# Patient Record
Sex: Female | Born: 1935 | Race: White | Hispanic: No | Marital: Married | Smoking: Former smoker
Health system: Southern US, Community
[De-identification: ages and names within clinical notes are randomized; demographics above are authoritative.]

## PROBLEM LIST (undated history)

## (undated) DIAGNOSIS — K219 Gastro-esophageal reflux disease without esophagitis: Secondary | ICD-10-CM

## (undated) DIAGNOSIS — I350 Nonrheumatic aortic (valve) stenosis: Secondary | ICD-10-CM

## (undated) DIAGNOSIS — K589 Irritable bowel syndrome without diarrhea: Secondary | ICD-10-CM

## (undated) DIAGNOSIS — F419 Anxiety disorder, unspecified: Secondary | ICD-10-CM

## (undated) DIAGNOSIS — F0390 Unspecified dementia without behavioral disturbance: Secondary | ICD-10-CM

## (undated) DIAGNOSIS — R413 Other amnesia: Secondary | ICD-10-CM

## (undated) DIAGNOSIS — E785 Hyperlipidemia, unspecified: Secondary | ICD-10-CM

## (undated) DIAGNOSIS — E079 Disorder of thyroid, unspecified: Secondary | ICD-10-CM

## (undated) DIAGNOSIS — I10 Essential (primary) hypertension: Secondary | ICD-10-CM

## (undated) DIAGNOSIS — M1990 Unspecified osteoarthritis, unspecified site: Secondary | ICD-10-CM

## (undated) HISTORY — DX: Other amnesia: R41.3

## (undated) HISTORY — PX: ABDOMINAL HYSTERECTOMY: SHX81

## (undated) HISTORY — DX: Unspecified osteoarthritis, unspecified site: M19.90

## (undated) HISTORY — DX: Essential (primary) hypertension: I10

## (undated) HISTORY — DX: Irritable bowel syndrome, unspecified: K58.9

## (undated) HISTORY — DX: Hyperlipidemia, unspecified: E78.5

## (undated) HISTORY — DX: Anxiety disorder, unspecified: F41.9

## (undated) HISTORY — DX: Nonrheumatic aortic (valve) stenosis: I35.0

## (undated) HISTORY — DX: Disorder of thyroid, unspecified: E07.9

## (undated) HISTORY — DX: Unspecified dementia, unspecified severity, without behavioral disturbance, psychotic disturbance, mood disturbance, and anxiety: F03.90

## (undated) HISTORY — DX: Gastro-esophageal reflux disease without esophagitis: K21.9

---

## 1991-07-18 HISTORY — PX: ESOPHAGOGASTRODUODENOSCOPY: SHX1529

## 1993-01-30 HISTORY — PX: ESOPHAGOGASTRODUODENOSCOPY: SHX1529

## 1998-09-17 ENCOUNTER — Encounter: Admission: RE | Admit: 1998-09-17 | Discharge: 1998-12-16 | Payer: Self-pay | Admitting: Orthopaedic Surgery

## 1999-03-17 ENCOUNTER — Emergency Department (HOSPITAL_COMMUNITY): Admission: EM | Admit: 1999-03-17 | Discharge: 1999-03-18 | Payer: Self-pay | Admitting: Emergency Medicine

## 1999-06-14 ENCOUNTER — Emergency Department (HOSPITAL_COMMUNITY): Admission: EM | Admit: 1999-06-14 | Discharge: 1999-06-14 | Payer: Self-pay | Admitting: Emergency Medicine

## 2004-02-05 ENCOUNTER — Encounter: Admission: RE | Admit: 2004-02-05 | Discharge: 2004-02-05 | Payer: Self-pay | Admitting: Orthopedic Surgery

## 2004-02-08 ENCOUNTER — Ambulatory Visit (HOSPITAL_COMMUNITY): Admission: RE | Admit: 2004-02-08 | Discharge: 2004-02-08 | Payer: Self-pay | Admitting: Orthopedic Surgery

## 2004-02-08 ENCOUNTER — Ambulatory Visit (HOSPITAL_BASED_OUTPATIENT_CLINIC_OR_DEPARTMENT_OTHER): Admission: RE | Admit: 2004-02-08 | Discharge: 2004-02-08 | Payer: Self-pay | Admitting: Orthopedic Surgery

## 2010-07-22 ENCOUNTER — Ambulatory Visit: Payer: Self-pay | Admitting: Cardiology

## 2011-02-05 ENCOUNTER — Ambulatory Visit: Payer: Self-pay | Admitting: Cardiology

## 2011-02-06 ENCOUNTER — Ambulatory Visit (INDEPENDENT_AMBULATORY_CARE_PROVIDER_SITE_OTHER): Payer: Medicare Other | Admitting: *Deleted

## 2011-02-06 DIAGNOSIS — I10 Essential (primary) hypertension: Secondary | ICD-10-CM

## 2011-02-06 DIAGNOSIS — E039 Hypothyroidism, unspecified: Secondary | ICD-10-CM

## 2011-02-06 DIAGNOSIS — I359 Nonrheumatic aortic valve disorder, unspecified: Secondary | ICD-10-CM

## 2011-04-23 ENCOUNTER — Other Ambulatory Visit: Payer: Self-pay | Admitting: *Deleted

## 2011-04-23 DIAGNOSIS — F419 Anxiety disorder, unspecified: Secondary | ICD-10-CM

## 2011-04-23 MED ORDER — ALPRAZOLAM 0.25 MG PO TABS: ORAL_TABLET | ORAL | Status: DC

## 2011-04-23 NOTE — Telephone Encounter (Signed)
Received fax from [facility] requesting rx for xanax.  Faxed to facility

## 2011-05-08 NOTE — Op Note (Signed)
NAME:  Savannah Todd, Savannah Todd NO.:  [id number]   MEDICAL RECORD NO.:  [id number]                   PATIENT TYPE:  AMB   LOCATION:  DSC                                  FACILITY:  [facility]   PHYSICIAN:  Thera Flake., M.D.             DATE OF BIRTH:  [birth year]   DATE OF PROCEDURE:  02/08/2004  DATE OF DISCHARGE:                                 OPERATIVE REPORT   INDICATIONS FOR PROCEDURE:  This is an 77 year old who fractured her  clavicle with possible rotator cuff tear thought to be amenable to  outpatient surgery.   PREOPERATIVE DIAGNOSIS:  Rotator cuff tear.   POSTOPERATIVE DIAGNOSIS:  1. Partial rotator cuff tear.  2. Degenerative tear of anterior superior labrum.  3. Grade 3 glenohumeral arthritis.  4. Acromioclavicular arthritis.   OPERATION:  1. Arthroscopic acromioplasty.  2. Arthroscopic debridement of torn labrum and glenohumeral joint.  3. Arthroscopic distal clavicle excision.   SURGEON:  Dyke Brackett, M.D.   ASSISTANT:  Madilyn Fireman, P.A.-C.   ANESTHESIA:  General/block.   DESCRIPTION OF PROCEDURE:  Examination under anesthesia showed no loss of  motion, no instability.  She was arthroscoped through a posterolateral and  anterior portal.  The glenohumeral joint showed degenerative change, I would  estimate as early grade 3 changes, involving approximately 50% of the  surface area of the humeral head with probably 75% involvement of the  glenoid.  There were not severe glenohumeral joint changes but I would  describe them as moderate, though she did have degenerative tear of the  anterior superior labrum.  The cuff did not show a complete tear.  There was  basically almost a meniscus of tissue where there was more healthy tissue  followed by a short interval of about a cm of more fibrous type cuff, but  again, there was no complete cuff tear.  It is my feeling based on the lack  of the complete tear, it is a degree of glenohumeral  joint arthritis and  arthroscopic debridement would be preferable to opening the shoulder,  completing the tear, and repairing it.  The joint was debrided separate from  the subacromial space which was entered.  There was a significant  impingement lesion on the CA ligament which was removed, AC arthritis that  was resected of about 1 cm of the distal clavicle.  All the impingement was  relieved with bursectomy and acromioplasty carried back from the lateral and  posterior portal.  A  complete bursectomy was carried out.  The superior surface of the cuff was  thoroughly inspected and not seen to be a complete tear.  The shoulder was  drained clear of fluid.  The portals were closed with nylon.  A lightly  compressive sterile dressing was applied.  Thera Flake., M.D.    WDC/MEDQ  D:  02/08/2004  T:  02/09/2004  Job:  [id number]

## 2011-05-19 ENCOUNTER — Encounter: Payer: Self-pay | Admitting: Cardiology

## 2011-06-03 ENCOUNTER — Encounter: Payer: Self-pay | Admitting: Cardiology

## 2011-06-05 ENCOUNTER — Ambulatory Visit (INDEPENDENT_AMBULATORY_CARE_PROVIDER_SITE_OTHER): Payer: Medicare Other | Admitting: Cardiology

## 2011-06-05 ENCOUNTER — Encounter: Payer: Self-pay | Admitting: Cardiology

## 2011-06-05 DIAGNOSIS — I119 Hypertensive heart disease without heart failure: Secondary | ICD-10-CM

## 2011-06-05 DIAGNOSIS — E7800 Pure hypercholesterolemia, unspecified: Secondary | ICD-10-CM

## 2011-06-05 DIAGNOSIS — E039 Hypothyroidism, unspecified: Secondary | ICD-10-CM | POA: Insufficient documentation

## 2011-06-05 DIAGNOSIS — I350 Nonrheumatic aortic (valve) stenosis: Secondary | ICD-10-CM

## 2011-06-05 DIAGNOSIS — K589 Irritable bowel syndrome without diarrhea: Secondary | ICD-10-CM

## 2011-06-05 DIAGNOSIS — F0390 Unspecified dementia without behavioral disturbance: Secondary | ICD-10-CM

## 2011-06-05 DIAGNOSIS — I359 Nonrheumatic aortic valve disorder, unspecified: Secondary | ICD-10-CM

## 2011-06-05 NOTE — Progress Notes (Signed)
Savannah Todd Date of Birth:  1920-07-02 [facility] / [facility] [address]   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  She has a history of essential hypertension and history of known aortic stenosis.  Her last echocardiogram on 04/17/10 showed an aortic valve area of 0.8.  She has not been expressing any exertional chest pain or symptoms of congestive heart failure.  She's not having any exertional dizziness or syncope.  She does have essential hypertension and is on amlodipine and has mild ankle edema related to that.  She has a history of hypothyroidism and is clinically euthyroid.  She has a history of poor balance and has done better with fewer falls and that she uses a walker on a regular basis.  The nursing home staff he is now administering her medication and so she is getting it reliably  Current Outpatient Prescriptions  Medication Sig Dispense Refill  . acetaminophen (TYLENOL) 500 MG tablet Take 500 mg by mouth every 6 (six) hours as needed.        . ALPRAZolam (XANAX) 0.25 MG tablet 1/2 tablet twice daily as needed  30 tablet  5  . amLODipine (NORVASC) 5 MG tablet Take 5 mg by mouth daily.        . balsalazide (COLAZAL) 750 MG capsule Take 2,250 mg by mouth 3 (three) times daily. 3 tablets TID        . BREWERS YEAST PO Take 2 tablets by mouth 3 (three) times daily.        . Calcium Carbonate Antacid (TUMS PO) Take 1 tablet by mouth as needed.        . Calcium-Vitamin D-Vitamin K (VIACTIV PO) Take 1 tablet by mouth 3 (three) times daily.        . Cholecalciferol (VITAMIN D PO) Take by mouth. Taking 400 IU daily       . digoxin (LANOXIN) 0.125 MG tablet Take 125 mcg by mouth daily.        . diphenhydrAMINE (BENADRYL) 25 MG tablet Take 25 mg by mouth every 6 (six) hours as needed.        Marland Kitchen esomeprazole (NEXIUM) 40  MG capsule Take 40 mg by mouth daily before breakfast.        . fexofenadine (ALLEGRA) 60 MG tablet Take 60 mg by mouth daily.        . hydrochlorothiazide 25 MG tablet Take 25 mg by mouth daily. 1/2 tablet prn       . levothyroxine (SYNTHROID, LEVOTHROID) 100 MCG tablet Take 112 mcg by mouth daily.        . meloxicam (MOBIC) 7.5 MG tablet Take 7.5 mg by mouth daily.        . Multiple Vitamin (MULTIVITAMIN PO) Take 1 tablet by mouth daily.        . nitroGLYCERIN (NITROSTAT) 0.4 MG SL tablet Place 0.4 mg under the tongue every 5 (five) minutes as needed.        . Nutritional Supplements (ENSURE PO) Take 1 Can by mouth 2 (two) times daily.        . simvastatin (ZOCOR) 10 MG tablet Take 10 mg by mouth at bedtime.        Marland Kitchen DISCONTD: nitroGLYCERIN (NITRODUR - DOSED IN MG/24 HR) 0.4 mg/hr Place 1  patch onto the skin as needed.          Allergies  Allergen Reactions  . Niaspan (Niacin (Antihyperlipidemic)) Itching  . Bactrim Nausea Only  . Lotensin Cough  . Naprosyn (Naproxen) Itching    Patient Active Problem List  Diagnoses  . Aortic stenosis  . Dementia  . Hypothyroidism  . Irritable bowel syndrome  . Benign hypertensive heart disease without heart failure  . Hypercholesterolemia    History  Smoking status  . Never Smoker   Smokeless tobacco  . Not on file    History  Alcohol Use No    Family History  Problem Relation Age of Onset  . Heart disease      Review of Systems: Constitutional: no fever chills diaphoresis or fatigue or change in weight.  Head and neck: no hearing loss, no epistaxis, no photophobia or visual disturbance. Respiratory: No cough, shortness of breath or wheezing. Cardiovascular: No chest pain peripheral edema, palpitations. Gastrointestinal: No abdominal distention, no abdominal pain, no change in bowel habits hematochezia or melena. Genitourinary: No dysuria, no frequency, no urgency, no nocturia. Musculoskeletal:No arthralgias, no back pain, no  gait disturbance or myalgias. Neurological: No dizziness, no headaches, no numbness, no seizures, no syncope, no weakness, no tremors. Hematologic: No lymphadenopathy, no easy bruising. Psychiatric: No confusion, no hallucinations, no sleep disturbance.    Physical Exam: Filed Vitals:   06/05/11 1118  BP: 144/70  Pulse: 64  The general appearance reveals a well-developed elderly woman in no acute distress.The head and neck exam reveals pupils equal and reactive.  Extraocular movements are full.  There is no scleral icterus.  The mouth and pharynx are normal.  The neck is supple.  The carotids reveal Soft bruits louder on the left than the right.  The jugular venous pressure is normal.  The  thyroid is not enlarged.  There is no lymphadenopathy.  The chest is clear to percussion and auscultation.  There are no rales or rhonchi.  Expansion of the chest is symmetrical.  The precordium is quiet.  The first heart sound is normal.  The second heart sound is physiologically split.  There is a grade 2/6 harsh systolic ejection murmur at the base.  There is no abnormal lift or heave.  The abdomen is soft and nontender.  The bowel sounds are normal.  The liver and spleen are not enlarged.  There are no abdominal masses.  There are no abdominal bruits.  Extremities reveal good pedal pulses.  There is no phlebitis or edema.  There is no cyanosis or clubbing.  Strength is normal and symmetrical in all extremities.  There is no lateralizing weakness.  There are no sensory deficits.  The skin is warm and dry.  There is no rash.   Assessment / Plan: Continue same medication.  Recheck in 4 months and consider getting 2-D echo after next visit to followup on aortic stenosis.

## 2011-06-05 NOTE — Assessment & Plan Note (Signed)
The patient has a history of irritable bowel syndrome.  She is followed for this by Dr. Carman Ching.  She reports that her bowels have done better on her present medical regimen.

## 2011-06-05 NOTE — Assessment & Plan Note (Signed)
The patient is on simvastatin 10 mg daily for her hypercholesterolemia.  Dr. Cleta Alberts is following her cholesterol.  She's not having any side effects from the simvastatin.

## 2011-06-05 NOTE — Assessment & Plan Note (Signed)
The patient has a history of essential hypertension.  She is on amlodipine.  She is having some mild ankle edema probably secondary to amlodipine.  She is not having any orthopnea or paroxysmal nocturnal dyspnea.

## 2011-06-05 NOTE — Assessment & Plan Note (Signed)
The patient has a history of known severe aortic stenosis.  Her last echocardiogram on 04/17/10 showed an aortic valve area of 0.8.  She has not been experiencing any of the cardinal manifestations of severe aortic stenosis.  She denies any chest pain or increased dyspnea.  She has not had exertional syncope or dizziness.

## 2011-06-19 ENCOUNTER — Encounter: Payer: Self-pay | Admitting: Cardiology

## 2011-08-17 ENCOUNTER — Other Ambulatory Visit: Payer: Self-pay | Admitting: Emergency Medicine

## 2011-08-17 ENCOUNTER — Ambulatory Visit
Admission: RE | Admit: 2011-08-17 | Discharge: 2011-08-17 | Disposition: A | Discharge status: Home / Self Care | Payer: No Typology Code available for payment source | Source: Ambulatory Visit | Attending: Emergency Medicine | Admitting: Emergency Medicine

## 2011-08-17 DIAGNOSIS — R410 Disorientation, unspecified: Secondary | ICD-10-CM

## 2011-09-08 ENCOUNTER — Telehealth: Payer: Self-pay | Admitting: Cardiology

## 2011-09-08 NOTE — Telephone Encounter (Signed)
Left message

## 2011-09-08 NOTE — Telephone Encounter (Signed)
Called to schedule his mother's appointment from a recall letter them received and was uncomfortable. Please call back.

## 2011-10-02 NOTE — Telephone Encounter (Signed)
Spoke with son and moved appointment up

## 2011-10-20 ENCOUNTER — Ambulatory Visit (INDEPENDENT_AMBULATORY_CARE_PROVIDER_SITE_OTHER): Payer: Medicare Other | Admitting: Cardiology

## 2011-10-20 ENCOUNTER — Encounter: Payer: Self-pay | Admitting: Cardiology

## 2011-10-20 VITALS — BP 160/70 | HR 54 | Ht 62.0 in | Wt 124.8 lb

## 2011-10-20 DIAGNOSIS — I119 Hypertensive heart disease without heart failure: Secondary | ICD-10-CM

## 2011-10-20 DIAGNOSIS — R42 Dizziness and giddiness: Secondary | ICD-10-CM

## 2011-10-20 DIAGNOSIS — I359 Nonrheumatic aortic valve disorder, unspecified: Secondary | ICD-10-CM

## 2011-10-20 DIAGNOSIS — I350 Nonrheumatic aortic (valve) stenosis: Secondary | ICD-10-CM

## 2011-10-20 DIAGNOSIS — F0390 Unspecified dementia without behavioral disturbance: Secondary | ICD-10-CM

## 2011-10-20 NOTE — Assessment & Plan Note (Signed)
Patient has aortic stenosis.  Her last echocardiogram on 04/17/10 showed an aortic valve area of 0.8.  She has not been expressing any symptoms of congestive heart failure.  She denies any exertional dizziness or syncope.  Does have unsteadiness of gait and has had falls from poor balance.  Because of her many comorbidities.  She would not be a good operative candidate.

## 2011-10-20 NOTE — Assessment & Plan Note (Signed)
The patient does have mild systolic hypertension.  She was on low-dose amlodipine 5 mg daily.  She had an EKG yesterday at her primary care's office, which showed inferior wall Q waves and left axis deviation.  Reviewing all her old EKGs there does not appear to be any significant change over the years.

## 2011-10-20 NOTE — Patient Instructions (Signed)
Your physician wants you to follow-up in: 6 months  You will receive a reminder letter in the mail two months in advance. If you don't receive a letter, please call our office to schedule the follow-up appointment.  Your physician recommends that you continue on your current medications as directed. Please refer to the Current Medication list given to you today.  Use your rolling wheelchair to help prevent falls

## 2011-10-20 NOTE — Assessment & Plan Note (Signed)
The patient has a history of mild dementia, which has not appeared to increased significantly since her last visit

## 2011-10-20 NOTE — Progress Notes (Signed)
Savannah Todd Date of Birth:  11-09-1920 [facility] / [facility] [address]   [address] [place], [place]  [address] [phone]           Fax   [phone]  History of Present Illness: This pleasant 77 year old woman is seen for a scheduled 4 month followup office visit.  Is a history of known aortic stenosis.  She has a history of essential hypertension.  She has not been experiencing any chest pain or angina pectoris.  The does not have any history of known ischemic heart disease.  She had a normal nuclear stress test on 04/02/99.  She has been having difficulty with missiles.  She's had several recent falls and x-rays have shown some stress fractures and compression fractures.  She is receiving her primary care now from Dr. Cleta Alberts.  Current Outpatient Prescriptions  Medication Sig Dispense Refill  . acetaminophen (TYLENOL) 500 MG tablet Take 500 mg by mouth every 6 (six) hours as needed.        Marland Kitchen amLODipine (NORVASC) 5 MG tablet Take 5 mg by mouth daily.        . balsalazide (COLAZAL) 750 MG capsule Take 2,250 mg by mouth 3 (three) times daily. 3 tablets TID        . BREWERS YEAST PO Take 2 tablets by mouth 3 (three) times daily.        . Calcium Carbonate Antacid (TUMS PO) Take 1 tablet by mouth as needed.        . Calcium-Vitamin D-Vitamin K (VIACTIV PO) Take 1 tablet by mouth 3 (three) times daily.        . Cholecalciferol (VITAMIN D PO) Take by mouth. Taking 400 IU daily       . digoxin (LANOXIN) 0.125 MG tablet Take 125 mcg by mouth daily.        . diphenhydrAMINE (BENADRYL) 25 MG tablet Take 25 mg by mouth every 6 (six) hours as needed.        Marland Kitchen esomeprazole (NEXIUM) 40 MG capsule Take 40 mg by mouth daily before breakfast.        . fexofenadine (ALLEGRA) 60 MG tablet Take 60 mg by mouth daily.        . hydrochlorothiazide 25 MG tablet Take 25 mg by mouth daily. 1/2 tablet prn       . levothyroxine (SYNTHROID, LEVOTHROID) 100 MCG tablet Take 112 mcg by mouth  daily.        . meloxicam (MOBIC) 7.5 MG tablet Take 7.5 mg by mouth daily.        . Multiple Vitamin (MULTIVITAMIN PO) Take 1 tablet by mouth daily.        . nitroGLYCERIN (NITROSTAT) 0.4 MG SL tablet Place 0.4 mg under the tongue every 5 (five) minutes as needed.        . Nutritional Supplements (ENSURE PO) Take 1 Can by mouth 2 (two) times daily.        . simvastatin (ZOCOR) 10 MG tablet Take 10 mg by mouth at bedtime.          Allergies  Allergen Reactions  . Niaspan (Niacin (Antihyperlipidemic)) Itching  . Bactrim Nausea Only  . Lotensin Cough  . Naprosyn (Naproxen) Itching    Patient Active Problem List  Diagnoses  . Aortic stenosis  . Dementia  . Hypothyroidism  . Irritable bowel syndrome  . Benign hypertensive heart disease without heart failure  . Hypercholesterolemia    History  Smoking status  .  Never Smoker   Smokeless tobacco  . Not on file    History  Alcohol Use No    Family History  Problem Relation Age of Onset  . Heart disease      Review of Systems: Constitutional: no fever chills diaphoresis or fatigue or change in weight.  Head and neck: no hearing loss, no epistaxis, no photophobia or visual disturbance. Respiratory: No cough, shortness of breath or wheezing. Cardiovascular: No chest pain peripheral edema, palpitations. Gastrointestinal: No abdominal distention, no abdominal pain, no change in bowel habits hematochezia or melena. Genitourinary: No dysuria, no frequency, no urgency, no nocturia. Musculoskeletal:No arthralgias, no back pain, no gait disturbance or myalgias. Neurological: No dizziness, no headaches, no numbness, no seizures, no syncope, no weakness, no tremors. Hematologic: No lymphadenopathy, no easy bruising. Psychiatric: No confusion, no hallucinations, no sleep disturbance.    Physical Exam: Filed Vitals:   10/20/11 1618  BP: 160/70  Pulse: 54   the general appearance reveals a frail, elderly woman, who requires  assistance getting from the chair to the bed.  She uses a rolling walker.Pupils equal and reactive.   Extraocular Movements are full.  There is no scleral icterus.  The mouth and pharynx are normal.  The neck is supple.  The carotids reveal no bruits.  The jugular venous pressure is normal.  The thyroid is not enlarged.  There is no lymphadenopathy.  I repeated her blood pressure myself and got 150/60 left armThe chest is clear to percussion and auscultation. There are no rales or rhonchi. Expansion of the chest is symmetrical.  The heart reveals grade 2/6 systolic ejection murmur at the base.  No gallop or rubThe abdomen is soft and nontender. Bowel sounds are normal. The liver and spleen are not enlarged. There Are no abdominal masses. There are no bruits.  The pedal pulses are good.  There is no phlebitis..  There is no cyanosis or clubbing.  There is mild ankle edemaThe skin is warm and dry.  There is no rash. Strength is normal and symmetrical in all extremities.  There is no lateralizing weakness.  There are no sensory deficits.      Assessment / Plan: The patient will continue same medication and recheck in 6 months for followup office visit and EKG.  We emphasized the importance of always using the rolling walker when she moves about to avoid further falls

## 2011-11-23 ENCOUNTER — Ambulatory Visit: Payer: Medicare Other | Admitting: Cardiology

## 2011-12-23 DIAGNOSIS — Z9181 History of falling: Secondary | ICD-10-CM | POA: Diagnosis not present

## 2011-12-23 DIAGNOSIS — R279 Unspecified lack of coordination: Secondary | ICD-10-CM | POA: Diagnosis not present

## 2011-12-23 DIAGNOSIS — R262 Difficulty in walking, not elsewhere classified: Secondary | ICD-10-CM | POA: Diagnosis not present

## 2011-12-23 DIAGNOSIS — M6281 Muscle weakness (generalized): Secondary | ICD-10-CM | POA: Diagnosis not present

## 2011-12-23 DIAGNOSIS — M545 Low back pain, unspecified: Secondary | ICD-10-CM | POA: Diagnosis not present

## 2012-01-22 DIAGNOSIS — Z9181 History of falling: Secondary | ICD-10-CM | POA: Diagnosis not present

## 2012-01-22 DIAGNOSIS — R279 Unspecified lack of coordination: Secondary | ICD-10-CM | POA: Diagnosis not present

## 2012-01-22 DIAGNOSIS — M6281 Muscle weakness (generalized): Secondary | ICD-10-CM | POA: Diagnosis not present

## 2012-01-26 DIAGNOSIS — Z9181 History of falling: Secondary | ICD-10-CM | POA: Diagnosis not present

## 2012-01-26 DIAGNOSIS — R279 Unspecified lack of coordination: Secondary | ICD-10-CM | POA: Diagnosis not present

## 2012-01-26 DIAGNOSIS — M6281 Muscle weakness (generalized): Secondary | ICD-10-CM | POA: Diagnosis not present

## 2012-02-02 DIAGNOSIS — Z9181 History of falling: Secondary | ICD-10-CM | POA: Diagnosis not present

## 2012-02-02 DIAGNOSIS — R279 Unspecified lack of coordination: Secondary | ICD-10-CM | POA: Diagnosis not present

## 2012-02-02 DIAGNOSIS — M6281 Muscle weakness (generalized): Secondary | ICD-10-CM | POA: Diagnosis not present

## 2012-02-05 DIAGNOSIS — R279 Unspecified lack of coordination: Secondary | ICD-10-CM | POA: Diagnosis not present

## 2012-02-05 DIAGNOSIS — Z9181 History of falling: Secondary | ICD-10-CM | POA: Diagnosis not present

## 2012-02-05 DIAGNOSIS — M6281 Muscle weakness (generalized): Secondary | ICD-10-CM | POA: Diagnosis not present

## 2012-02-09 DIAGNOSIS — R279 Unspecified lack of coordination: Secondary | ICD-10-CM | POA: Diagnosis not present

## 2012-02-09 DIAGNOSIS — M6281 Muscle weakness (generalized): Secondary | ICD-10-CM | POA: Diagnosis not present

## 2012-02-09 DIAGNOSIS — Z9181 History of falling: Secondary | ICD-10-CM | POA: Diagnosis not present

## 2012-02-12 DIAGNOSIS — Z9181 History of falling: Secondary | ICD-10-CM | POA: Diagnosis not present

## 2012-02-12 DIAGNOSIS — R279 Unspecified lack of coordination: Secondary | ICD-10-CM | POA: Diagnosis not present

## 2012-02-12 DIAGNOSIS — M6281 Muscle weakness (generalized): Secondary | ICD-10-CM | POA: Diagnosis not present

## 2012-02-16 DIAGNOSIS — Z9181 History of falling: Secondary | ICD-10-CM | POA: Diagnosis not present

## 2012-02-16 DIAGNOSIS — R279 Unspecified lack of coordination: Secondary | ICD-10-CM | POA: Diagnosis not present

## 2012-02-16 DIAGNOSIS — M6281 Muscle weakness (generalized): Secondary | ICD-10-CM | POA: Diagnosis not present

## 2012-02-19 DIAGNOSIS — M6281 Muscle weakness (generalized): Secondary | ICD-10-CM | POA: Diagnosis not present

## 2012-02-19 DIAGNOSIS — R279 Unspecified lack of coordination: Secondary | ICD-10-CM | POA: Diagnosis not present

## 2012-02-19 DIAGNOSIS — Z9181 History of falling: Secondary | ICD-10-CM | POA: Diagnosis not present

## 2012-03-09 ENCOUNTER — Telehealth: Payer: Self-pay

## 2012-03-09 NOTE — Telephone Encounter (Signed)
Savannah Todd from [facility] sent a physicians order on  03-14 and then on 03-13 she still has not received physicians order back yet please fax info back [phone]

## 2012-03-16 ENCOUNTER — Telehealth: Payer: Self-pay

## 2012-03-16 NOTE — Telephone Encounter (Signed)
Savannah Todd FROM [facility] STATES THEY HAVE FAXED OVER AND ORDER TO BE SIGNED BY DR DAUB 3 TIMES AND STILL HASN'T HEARD FROM ANYONE THEY REALLY NEED TO TAKE CARE OF THIS ASAP. PLEASE CALL [phone] AND THE FAX IS [phone]

## 2012-03-17 NOTE — Telephone Encounter (Signed)
Dr. Cleta Alberts, is this one of the forms we faxed this am?  If not, we can have them re-fax.

## 2012-03-18 NOTE — Telephone Encounter (Signed)
Spoke with Crystal and she stated that form has been received.

## 2012-03-18 NOTE — Telephone Encounter (Signed)
Called [facility] and have them re fax the form .

## 2012-03-21 DIAGNOSIS — K5190 Ulcerative colitis, unspecified, without complications: Secondary | ICD-10-CM | POA: Diagnosis not present

## 2012-05-03 DIAGNOSIS — M6281 Muscle weakness (generalized): Secondary | ICD-10-CM | POA: Diagnosis not present

## 2012-05-03 DIAGNOSIS — Z9181 History of falling: Secondary | ICD-10-CM | POA: Diagnosis not present

## 2012-05-03 DIAGNOSIS — R262 Difficulty in walking, not elsewhere classified: Secondary | ICD-10-CM | POA: Diagnosis not present

## 2012-05-03 DIAGNOSIS — R279 Unspecified lack of coordination: Secondary | ICD-10-CM | POA: Diagnosis not present

## 2012-05-05 DIAGNOSIS — R279 Unspecified lack of coordination: Secondary | ICD-10-CM | POA: Diagnosis not present

## 2012-05-05 DIAGNOSIS — M6281 Muscle weakness (generalized): Secondary | ICD-10-CM | POA: Diagnosis not present

## 2012-05-05 DIAGNOSIS — Z9181 History of falling: Secondary | ICD-10-CM | POA: Diagnosis not present

## 2012-05-05 DIAGNOSIS — R262 Difficulty in walking, not elsewhere classified: Secondary | ICD-10-CM | POA: Diagnosis not present

## 2012-05-10 DIAGNOSIS — Z9181 History of falling: Secondary | ICD-10-CM | POA: Diagnosis not present

## 2012-05-10 DIAGNOSIS — R262 Difficulty in walking, not elsewhere classified: Secondary | ICD-10-CM | POA: Diagnosis not present

## 2012-05-10 DIAGNOSIS — M6281 Muscle weakness (generalized): Secondary | ICD-10-CM | POA: Diagnosis not present

## 2012-05-10 DIAGNOSIS — R279 Unspecified lack of coordination: Secondary | ICD-10-CM | POA: Diagnosis not present

## 2012-05-12 DIAGNOSIS — Z9181 History of falling: Secondary | ICD-10-CM | POA: Diagnosis not present

## 2012-05-12 DIAGNOSIS — R262 Difficulty in walking, not elsewhere classified: Secondary | ICD-10-CM | POA: Diagnosis not present

## 2012-05-12 DIAGNOSIS — R279 Unspecified lack of coordination: Secondary | ICD-10-CM | POA: Diagnosis not present

## 2012-05-12 DIAGNOSIS — M6281 Muscle weakness (generalized): Secondary | ICD-10-CM | POA: Diagnosis not present

## 2012-05-16 DIAGNOSIS — R279 Unspecified lack of coordination: Secondary | ICD-10-CM | POA: Diagnosis not present

## 2012-05-16 DIAGNOSIS — M6281 Muscle weakness (generalized): Secondary | ICD-10-CM | POA: Diagnosis not present

## 2012-05-16 DIAGNOSIS — Z9181 History of falling: Secondary | ICD-10-CM | POA: Diagnosis not present

## 2012-05-16 DIAGNOSIS — R262 Difficulty in walking, not elsewhere classified: Secondary | ICD-10-CM | POA: Diagnosis not present

## 2012-05-18 DIAGNOSIS — R279 Unspecified lack of coordination: Secondary | ICD-10-CM | POA: Diagnosis not present

## 2012-05-18 DIAGNOSIS — M6281 Muscle weakness (generalized): Secondary | ICD-10-CM | POA: Diagnosis not present

## 2012-05-18 DIAGNOSIS — Z9181 History of falling: Secondary | ICD-10-CM | POA: Diagnosis not present

## 2012-05-18 DIAGNOSIS — R262 Difficulty in walking, not elsewhere classified: Secondary | ICD-10-CM | POA: Diagnosis not present

## 2012-05-23 DIAGNOSIS — R279 Unspecified lack of coordination: Secondary | ICD-10-CM | POA: Diagnosis not present

## 2012-05-23 DIAGNOSIS — R262 Difficulty in walking, not elsewhere classified: Secondary | ICD-10-CM | POA: Diagnosis not present

## 2012-05-23 DIAGNOSIS — M6281 Muscle weakness (generalized): Secondary | ICD-10-CM | POA: Diagnosis not present

## 2012-05-25 DIAGNOSIS — R279 Unspecified lack of coordination: Secondary | ICD-10-CM | POA: Diagnosis not present

## 2012-05-25 DIAGNOSIS — M6281 Muscle weakness (generalized): Secondary | ICD-10-CM | POA: Diagnosis not present

## 2012-05-25 DIAGNOSIS — R262 Difficulty in walking, not elsewhere classified: Secondary | ICD-10-CM | POA: Diagnosis not present

## 2012-05-30 DIAGNOSIS — R279 Unspecified lack of coordination: Secondary | ICD-10-CM | POA: Diagnosis not present

## 2012-05-30 DIAGNOSIS — M6281 Muscle weakness (generalized): Secondary | ICD-10-CM | POA: Diagnosis not present

## 2012-05-30 DIAGNOSIS — R262 Difficulty in walking, not elsewhere classified: Secondary | ICD-10-CM | POA: Diagnosis not present

## 2012-06-01 ENCOUNTER — Other Ambulatory Visit: Payer: Medicare Other

## 2012-06-01 ENCOUNTER — Ambulatory Visit
Admission: RE | Admit: 2012-06-01 | Discharge: 2012-06-01 | Disposition: A | Discharge status: Home / Self Care | Payer: Medicare Other | Source: Ambulatory Visit | Attending: Emergency Medicine | Admitting: Emergency Medicine

## 2012-06-01 ENCOUNTER — Ambulatory Visit (INDEPENDENT_AMBULATORY_CARE_PROVIDER_SITE_OTHER): Payer: Medicare Other | Admitting: Emergency Medicine

## 2012-06-01 VITALS — BP 134/73 | HR 56 | Temp 98.4°F | Resp 17 | Ht 60.0 in | Wt 118.0 lb

## 2012-06-01 DIAGNOSIS — F0390 Unspecified dementia without behavioral disturbance: Secondary | ICD-10-CM

## 2012-06-01 DIAGNOSIS — M6281 Muscle weakness (generalized): Secondary | ICD-10-CM | POA: Diagnosis not present

## 2012-06-01 DIAGNOSIS — E079 Disorder of thyroid, unspecified: Secondary | ICD-10-CM

## 2012-06-01 DIAGNOSIS — I10 Essential (primary) hypertension: Secondary | ICD-10-CM

## 2012-06-01 DIAGNOSIS — S0083XA Contusion of other part of head, initial encounter: Secondary | ICD-10-CM | POA: Diagnosis not present

## 2012-06-01 DIAGNOSIS — R51 Headache: Secondary | ICD-10-CM | POA: Diagnosis not present

## 2012-06-01 DIAGNOSIS — S0003XA Contusion of scalp, initial encounter: Secondary | ICD-10-CM

## 2012-06-01 DIAGNOSIS — R269 Unspecified abnormalities of gait and mobility: Secondary | ICD-10-CM | POA: Diagnosis not present

## 2012-06-01 DIAGNOSIS — R32 Unspecified urinary incontinence: Secondary | ICD-10-CM | POA: Diagnosis not present

## 2012-06-01 DIAGNOSIS — R262 Difficulty in walking, not elsewhere classified: Secondary | ICD-10-CM | POA: Diagnosis not present

## 2012-06-01 DIAGNOSIS — R279 Unspecified lack of coordination: Secondary | ICD-10-CM | POA: Diagnosis not present

## 2012-06-01 LAB — POCT URINALYSIS DIPSTICK
Glucose, UA: NEGATIVE
Protein, UA: NEGATIVE
Spec Grav, UA: <=1.005
Urobilinogen, UA: 0.2

## 2012-06-01 LAB — POCT UA - MICROSCOPIC ONLY
Casts, Ur, LPF, POC: NEGATIVE
Crystals, Ur, HPF, POC: NEGATIVE
Mucus, UA: NEGATIVE

## 2012-06-01 LAB — POCT CBC
Granulocyte percent: 63.6 %G (ref 37–80)
Hemoglobin: 13.1 g/dL (ref 12.2–16.2)
Lymph, poc: 3.2 (ref 0.6–3.4)
MCHC: 32.1 g/dL (ref 31.8–35.4)
MPV: 8.4 fL (ref 0–99.8)
POC Granulocyte: 7.3 — AB (ref 2–6.9)
POC LYMPH PERCENT: 28.1 %L (ref 10–50)
POC MID %: 8.3 %M (ref 0–12)
RDW, POC: 14 %

## 2012-06-01 NOTE — Progress Notes (Signed)
  Subjective:    Patient ID: Savannah Todd, female    DOB: 12-Nov-1920, 78 y.o.   MRN: [id number]  HPI patient here with recent problems with worsening balance. She is at a nursing facility and normally uses a walker she has physical therapy at the nursing facility she is at. She recently fell and struck the back of her head over the weekend. Yesterday she fell again and struck the front of her head.    Review of Systems     Objective:   Physical Exam patient is in no distress. Her neck is supple. Her chest was clear. She has 3/6 systolic murmur at the base of the heart. Neurologically her cranial nerves appear to be intact. She is unsteady in her gait with walker wide-based gait cogwheel rigidity or Parkinsonian tremor there is a 2 x 2 centimeters hematoma over the back of the head        Assessment & Plan:  Patient has a gait disorder. We'll proceed with a CT head because of her 2 recent falls be sure she doesn't have a subdural. She is to continue her physical therapy. Routine labs were done  she is to continue her regular followup with Dr. Patty Sermons

## 2012-06-02 LAB — COMPREHENSIVE METABOLIC PANEL
ALT: 11 U/L (ref 0–35)
AST: 20 U/L (ref 0–37)
Alkaline Phosphatase: 78 U/L (ref 39–117)
Sodium: 137 mEq/L (ref 135–145)
Total Bilirubin: 0.5 mg/dL (ref 0.3–1.2)
Total Protein: 7.2 g/dL (ref 6.0–8.3)

## 2012-06-06 DIAGNOSIS — M6281 Muscle weakness (generalized): Secondary | ICD-10-CM | POA: Diagnosis not present

## 2012-06-06 DIAGNOSIS — R279 Unspecified lack of coordination: Secondary | ICD-10-CM | POA: Diagnosis not present

## 2012-06-06 DIAGNOSIS — R262 Difficulty in walking, not elsewhere classified: Secondary | ICD-10-CM | POA: Diagnosis not present

## 2012-06-09 DIAGNOSIS — R262 Difficulty in walking, not elsewhere classified: Secondary | ICD-10-CM | POA: Diagnosis not present

## 2012-06-09 DIAGNOSIS — M6281 Muscle weakness (generalized): Secondary | ICD-10-CM | POA: Diagnosis not present

## 2012-06-09 DIAGNOSIS — R279 Unspecified lack of coordination: Secondary | ICD-10-CM | POA: Diagnosis not present

## 2012-06-13 DIAGNOSIS — R262 Difficulty in walking, not elsewhere classified: Secondary | ICD-10-CM | POA: Diagnosis not present

## 2012-06-13 DIAGNOSIS — M6281 Muscle weakness (generalized): Secondary | ICD-10-CM | POA: Diagnosis not present

## 2012-06-13 DIAGNOSIS — R279 Unspecified lack of coordination: Secondary | ICD-10-CM | POA: Diagnosis not present

## 2012-06-15 DIAGNOSIS — R279 Unspecified lack of coordination: Secondary | ICD-10-CM | POA: Diagnosis not present

## 2012-06-15 DIAGNOSIS — M6281 Muscle weakness (generalized): Secondary | ICD-10-CM | POA: Diagnosis not present

## 2012-06-15 DIAGNOSIS — R262 Difficulty in walking, not elsewhere classified: Secondary | ICD-10-CM | POA: Diagnosis not present

## 2012-06-20 DIAGNOSIS — R262 Difficulty in walking, not elsewhere classified: Secondary | ICD-10-CM | POA: Diagnosis not present

## 2012-06-20 DIAGNOSIS — R279 Unspecified lack of coordination: Secondary | ICD-10-CM | POA: Diagnosis not present

## 2012-06-20 DIAGNOSIS — M6281 Muscle weakness (generalized): Secondary | ICD-10-CM | POA: Diagnosis not present

## 2012-06-22 DIAGNOSIS — R279 Unspecified lack of coordination: Secondary | ICD-10-CM | POA: Diagnosis not present

## 2012-06-22 DIAGNOSIS — M6281 Muscle weakness (generalized): Secondary | ICD-10-CM | POA: Diagnosis not present

## 2012-06-22 DIAGNOSIS — R262 Difficulty in walking, not elsewhere classified: Secondary | ICD-10-CM | POA: Diagnosis not present

## 2012-06-27 DIAGNOSIS — R262 Difficulty in walking, not elsewhere classified: Secondary | ICD-10-CM | POA: Diagnosis not present

## 2012-06-27 DIAGNOSIS — M6281 Muscle weakness (generalized): Secondary | ICD-10-CM | POA: Diagnosis not present

## 2012-06-27 DIAGNOSIS — R279 Unspecified lack of coordination: Secondary | ICD-10-CM | POA: Diagnosis not present

## 2012-06-29 DIAGNOSIS — R262 Difficulty in walking, not elsewhere classified: Secondary | ICD-10-CM | POA: Diagnosis not present

## 2012-06-29 DIAGNOSIS — M6281 Muscle weakness (generalized): Secondary | ICD-10-CM | POA: Diagnosis not present

## 2012-06-29 DIAGNOSIS — R279 Unspecified lack of coordination: Secondary | ICD-10-CM | POA: Diagnosis not present

## 2012-07-12 ENCOUNTER — Encounter: Payer: Self-pay | Admitting: Emergency Medicine

## 2012-07-18 DIAGNOSIS — H353190 Nonexudative age-related macular degeneration, unspecified eye, stage unspecified: Secondary | ICD-10-CM | POA: Diagnosis not present

## 2012-07-18 DIAGNOSIS — Z961 Presence of intraocular lens: Secondary | ICD-10-CM | POA: Diagnosis not present

## 2012-09-16 ENCOUNTER — Telehealth: Payer: Self-pay | Admitting: Cardiology

## 2012-09-16 NOTE — Telephone Encounter (Signed)
Left message on machine ok to discontinue

## 2012-09-16 NOTE — Telephone Encounter (Signed)
Crystal with [facility] got your message and just needs a fax order for them to d/c previous order

## 2012-09-16 NOTE — Telephone Encounter (Signed)
Will fax as requested

## 2012-09-16 NOTE — Telephone Encounter (Signed)
She has an order from the 26th that she is to go to urgent care of ED for a fall but son is and MD and doesn't want Mom to go so they need [place] to d/c order or speak with son

## 2012-10-21 ENCOUNTER — Telehealth: Payer: Self-pay

## 2012-10-21 NOTE — Telephone Encounter (Signed)
Thanks, will fax when filled out.

## 2012-10-21 NOTE — Telephone Encounter (Signed)
Teresa Pelton from [facility]  is faxing an order over for Dr. Cleta Alberts to sign off on for a PTOT.

## 2012-10-27 ENCOUNTER — Telehealth: Payer: Self-pay | Admitting: Radiology

## 2012-10-27 DIAGNOSIS — R269 Unspecified abnormalities of gait and mobility: Secondary | ICD-10-CM

## 2012-10-27 NOTE — Telephone Encounter (Signed)
PT/ OT eval and treat order needed at [facility], they state they have faxed order. I do not have, generated order had Dr Cleta Alberts sign and have faxed to them

## 2012-10-31 DIAGNOSIS — R279 Unspecified lack of coordination: Secondary | ICD-10-CM | POA: Diagnosis not present

## 2012-10-31 DIAGNOSIS — M6281 Muscle weakness (generalized): Secondary | ICD-10-CM | POA: Diagnosis not present

## 2012-10-31 DIAGNOSIS — R262 Difficulty in walking, not elsewhere classified: Secondary | ICD-10-CM | POA: Diagnosis not present

## 2012-11-01 DIAGNOSIS — R262 Difficulty in walking, not elsewhere classified: Secondary | ICD-10-CM | POA: Diagnosis not present

## 2012-11-01 DIAGNOSIS — M6281 Muscle weakness (generalized): Secondary | ICD-10-CM | POA: Diagnosis not present

## 2012-11-01 DIAGNOSIS — R279 Unspecified lack of coordination: Secondary | ICD-10-CM | POA: Diagnosis not present

## 2012-11-03 DIAGNOSIS — R279 Unspecified lack of coordination: Secondary | ICD-10-CM | POA: Diagnosis not present

## 2012-11-03 DIAGNOSIS — R262 Difficulty in walking, not elsewhere classified: Secondary | ICD-10-CM | POA: Diagnosis not present

## 2012-11-03 DIAGNOSIS — M6281 Muscle weakness (generalized): Secondary | ICD-10-CM | POA: Diagnosis not present

## 2012-11-04 DIAGNOSIS — M6281 Muscle weakness (generalized): Secondary | ICD-10-CM | POA: Diagnosis not present

## 2012-11-04 DIAGNOSIS — R279 Unspecified lack of coordination: Secondary | ICD-10-CM | POA: Diagnosis not present

## 2012-11-04 DIAGNOSIS — R262 Difficulty in walking, not elsewhere classified: Secondary | ICD-10-CM | POA: Diagnosis not present

## 2012-11-07 DIAGNOSIS — R262 Difficulty in walking, not elsewhere classified: Secondary | ICD-10-CM | POA: Diagnosis not present

## 2012-11-07 DIAGNOSIS — M6281 Muscle weakness (generalized): Secondary | ICD-10-CM | POA: Diagnosis not present

## 2012-11-07 DIAGNOSIS — R279 Unspecified lack of coordination: Secondary | ICD-10-CM | POA: Diagnosis not present

## 2012-11-08 DIAGNOSIS — R279 Unspecified lack of coordination: Secondary | ICD-10-CM | POA: Diagnosis not present

## 2012-11-08 DIAGNOSIS — M6281 Muscle weakness (generalized): Secondary | ICD-10-CM | POA: Diagnosis not present

## 2012-11-08 DIAGNOSIS — R262 Difficulty in walking, not elsewhere classified: Secondary | ICD-10-CM | POA: Diagnosis not present

## 2012-11-10 DIAGNOSIS — R262 Difficulty in walking, not elsewhere classified: Secondary | ICD-10-CM | POA: Diagnosis not present

## 2012-11-10 DIAGNOSIS — R279 Unspecified lack of coordination: Secondary | ICD-10-CM | POA: Diagnosis not present

## 2012-11-10 DIAGNOSIS — M6281 Muscle weakness (generalized): Secondary | ICD-10-CM | POA: Diagnosis not present

## 2012-11-11 ENCOUNTER — Encounter: Payer: Self-pay | Admitting: Cardiology

## 2012-11-11 ENCOUNTER — Ambulatory Visit (INDEPENDENT_AMBULATORY_CARE_PROVIDER_SITE_OTHER): Payer: Medicare Other | Admitting: Cardiology

## 2012-11-11 VITALS — BP 144/60 | HR 64 | Resp 18 | Ht 63.0 in | Wt 133.4 lb

## 2012-11-11 DIAGNOSIS — I119 Hypertensive heart disease without heart failure: Secondary | ICD-10-CM

## 2012-11-11 DIAGNOSIS — R262 Difficulty in walking, not elsewhere classified: Secondary | ICD-10-CM | POA: Diagnosis not present

## 2012-11-11 DIAGNOSIS — I359 Nonrheumatic aortic valve disorder, unspecified: Secondary | ICD-10-CM | POA: Diagnosis not present

## 2012-11-11 DIAGNOSIS — R609 Edema, unspecified: Secondary | ICD-10-CM | POA: Diagnosis not present

## 2012-11-11 DIAGNOSIS — M6281 Muscle weakness (generalized): Secondary | ICD-10-CM | POA: Diagnosis not present

## 2012-11-11 DIAGNOSIS — R600 Localized edema: Secondary | ICD-10-CM

## 2012-11-11 DIAGNOSIS — R279 Unspecified lack of coordination: Secondary | ICD-10-CM | POA: Diagnosis not present

## 2012-11-11 DIAGNOSIS — I350 Nonrheumatic aortic (valve) stenosis: Secondary | ICD-10-CM

## 2012-11-11 DIAGNOSIS — F0390 Unspecified dementia without behavioral disturbance: Secondary | ICD-10-CM | POA: Diagnosis not present

## 2012-11-11 NOTE — Progress Notes (Signed)
Savannah Todd Date of Birth:  12-11-1920 [facility] [address] [place], [place]  [address] [phone]         Fax   [phone]  History of Present Illness: This pleasant 78 year old woman is seen for a  followup office visit.  The patient has a history of known aortic stenosis. She has a history of essential hypertension. She has not been experiencing any chest pain or angina pectoris. The does not have any history of known ischemic heart disease. She had a normal nuclear stress test on 04/02/99.  She does not have any history of congestive heart failure.  She came in today because of increasing peripheral edema.  Her weight is up 9 pounds in the past year by our scales.   Current Outpatient Prescriptions  Medication Sig Dispense Refill  . acetaminophen (TYLENOL) 500 MG tablet Take 500 mg by mouth every 6 (six) hours as needed.        Marland Kitchen amLODipine (NORVASC) 5 MG tablet Take 5 mg by mouth daily.        . balsalazide (COLAZAL) 750 MG capsule Take 2,250 mg by mouth 3 (three) times daily. 3 tablets TID        . BREWERS YEAST PO Take 2 tablets by mouth 3 (three) times daily.        . Calcium Carbonate Antacid (TUMS PO) Take 1 tablet by mouth as needed.        . Calcium-Vitamin D-Vitamin K (VIACTIV PO) Take 1 tablet by mouth 3 (three) times daily.        . Cholecalciferol (VITAMIN D PO) Take by mouth. Taking 400 IU daily       . digoxin (LANOXIN) 0.125 MG tablet Take 125 mcg by mouth daily.        . diphenhydrAMINE (BENADRYL) 25 MG tablet Take 25 mg by mouth every 6 (six) hours as needed.        Marland Kitchen esomeprazole (NEXIUM) 40 MG capsule Take 40 mg by mouth daily before breakfast.        . fexofenadine (ALLEGRA) 60 MG tablet Take 60 mg by mouth daily.        . hydrochlorothiazide 25 MG tablet Take 25 mg by mouth as directed. 1/2 tablet on Mon, Wed, and Fri only      . levothyroxine (SYNTHROID, LEVOTHROID) 100 MCG tablet Take 112 mcg by mouth daily.        . meloxicam  (MOBIC) 7.5 MG tablet Take 7.5 mg by mouth daily.        . Multiple Vitamin (MULTIVITAMIN PO) Take 1 tablet by mouth daily.        . nitroGLYCERIN (NITROSTAT) 0.4 MG SL tablet Place 0.4 mg under the tongue every 5 (five) minutes as needed.        . Nutritional Supplements (ENSURE PO) Take 1 Can by mouth 2 (two) times daily.        . simvastatin (ZOCOR) 10 MG tablet Take 10 mg by mouth at bedtime.          Allergies  Allergen Reactions  . Niaspan (Niacin Er) Itching  . Bactrim Nausea Only  . Benazepril Hcl Cough  . Naprosyn (Naproxen) Itching    Patient Active Problem List  Diagnosis  . Aortic stenosis  . Dementia  . Hypothyroidism  . Irritable bowel syndrome  . Benign hypertensive heart disease without heart failure  . Hypercholesterolemia    History  Smoking status  . Never Smoker  Smokeless tobacco  . Not on file    History  Alcohol Use No    Family History  Problem Relation Age of Onset  . Heart disease      Review of Systems: Constitutional: no fever chills diaphoresis or fatigue or change in weight.  Head and neck: no hearing loss, no epistaxis, no photophobia or visual disturbance. Respiratory: No cough, shortness of breath or wheezing. Cardiovascular: No chest pain peripheral edema, palpitations. Gastrointestinal: No abdominal distention, no abdominal pain, no change in bowel habits hematochezia or melena. Genitourinary: No dysuria, no frequency, no urgency, no nocturia. Musculoskeletal:No arthralgias, no back pain, no gait disturbance or myalgias. Neurological: No dizziness, no headaches, no numbness, no seizures, no syncope, no weakness, no tremors. Hematologic: No lymphadenopathy, no easy bruising. Psychiatric: No confusion, no hallucinations, no sleep disturbance.    Physical Exam: Filed Vitals:   11/11/12 1648  BP: 144/60  Pulse: 64  Resp: 18   the general appearance reveals an elderly woman in no acute distress.The head and neck exam reveals  pupils equal and reactive.  Extraocular movements are full.  There is no scleral icterus.  The mouth and pharynx are normal.  The neck is supple.  The carotids reveal no bruits.  The carotids have a normal upstroke and do not suggest severe aortic stenosis  The jugular venous pressure is normal.  The  thyroid is not enlarged.  There is no lymphadenopathy.  The chest is clear to percussion and auscultation.  There are no rales or rhonchi.  Expansion of the chest is symmetrical.  The precordium is quiet.  The first heart sound is normal.  The second heart sound is physiologically split.  There is a grade 3/6 harsh systolic ejection murmur at the left sternal edge.  No diastolic murmur.  No gallop.  The rhythm is regular.  There is no abnormal lift or heave.  The abdomen is soft and nontender.  The bowel sounds are normal.  The liver and spleen are not enlarged.  There are no abdominal masses.  There are no abdominal bruits.  Extremities reveal good pedal pulses.  There is 2+ pretibial and pedal edema on the left and 1+ on the right.  Homans sign is negative.  There is no calf tenderness  There is no cyanosis or clubbing.  Strength is normal and symmetrical in all extremities.  There is no lateralizing weakness.  There are no sensory deficits.  The skin is warm and dry.  There is no rash.   EKG shows sinus bradycardia and left axis deviation and nonspecific T-wave abnormalities.  No old EKG available for comparison.  Assessment / Plan: I believe that the edema may be secondary to several factors.  The amlodipine is a contributing factor.  Her dietary salt intake may be liberal. Plan: We will order her to get the hydrochlorothiazide 25 mg tablet taking one half tablet 3 times a week on a regular basis Monday Wednesday and Friday rather than just when necessary.  This should help mobilize some of the peripheral edema if her edema persists she is to let us know if she may need a stronger diuretic. We should check  her again in one year for office visit and EKG.  Continue regular primary care through Dr. Cleta Alberts.

## 2012-11-11 NOTE — Assessment & Plan Note (Signed)
Her dementia appears to be about the same as last year.  Her daughter-in-law is with her today.

## 2012-11-11 NOTE — Assessment & Plan Note (Signed)
The patient has a history of known aortic stenosis.  She is being managed medically.  There are no old echoes in Epic.  She has not been having any orthopnea or paroxysmal nocturnal dyspnea.  She has not been expressing any chest pain.  She has not had syncope.

## 2012-11-11 NOTE — Assessment & Plan Note (Signed)
She has a history of hypertension.  She is on amlodipine which may be contributing to her pedal edema.  However the amlodipine has done a good job controlling her systolic hypertension.  She has hydrochlorothiazide on her med list as a when necessary.  She does not think that she has been getting it very often.

## 2012-11-11 NOTE — Patient Instructions (Signed)
START TAKING THE HYDROCHLOROTHIAZIDE 25 MG 1/2 TABLET Monday, Wednesday, AND Friday ONLY  Your physician wants you to follow-up in: 1 year ov /ekg You will receive a reminder letter in the mail two months in advance. If you don't receive a letter, please call our office to schedule the follow-up appointment.

## 2012-11-14 DIAGNOSIS — R262 Difficulty in walking, not elsewhere classified: Secondary | ICD-10-CM | POA: Diagnosis not present

## 2012-11-14 DIAGNOSIS — M6281 Muscle weakness (generalized): Secondary | ICD-10-CM | POA: Diagnosis not present

## 2012-11-14 DIAGNOSIS — R279 Unspecified lack of coordination: Secondary | ICD-10-CM | POA: Diagnosis not present

## 2012-11-15 ENCOUNTER — Other Ambulatory Visit: Payer: Self-pay | Admitting: *Deleted

## 2012-11-15 DIAGNOSIS — M6281 Muscle weakness (generalized): Secondary | ICD-10-CM | POA: Diagnosis not present

## 2012-11-15 DIAGNOSIS — R279 Unspecified lack of coordination: Secondary | ICD-10-CM | POA: Diagnosis not present

## 2012-11-15 DIAGNOSIS — R262 Difficulty in walking, not elsewhere classified: Secondary | ICD-10-CM | POA: Diagnosis not present

## 2012-11-16 DIAGNOSIS — M6281 Muscle weakness (generalized): Secondary | ICD-10-CM | POA: Diagnosis not present

## 2012-11-16 DIAGNOSIS — R262 Difficulty in walking, not elsewhere classified: Secondary | ICD-10-CM | POA: Diagnosis not present

## 2012-11-16 DIAGNOSIS — R279 Unspecified lack of coordination: Secondary | ICD-10-CM | POA: Diagnosis not present

## 2012-11-22 DIAGNOSIS — M6281 Muscle weakness (generalized): Secondary | ICD-10-CM | POA: Diagnosis not present

## 2012-11-22 DIAGNOSIS — R262 Difficulty in walking, not elsewhere classified: Secondary | ICD-10-CM | POA: Diagnosis not present

## 2012-11-22 DIAGNOSIS — R279 Unspecified lack of coordination: Secondary | ICD-10-CM | POA: Diagnosis not present

## 2012-11-24 DIAGNOSIS — R279 Unspecified lack of coordination: Secondary | ICD-10-CM | POA: Diagnosis not present

## 2012-11-24 DIAGNOSIS — R262 Difficulty in walking, not elsewhere classified: Secondary | ICD-10-CM | POA: Diagnosis not present

## 2012-11-24 DIAGNOSIS — M6281 Muscle weakness (generalized): Secondary | ICD-10-CM | POA: Diagnosis not present

## 2012-11-25 DIAGNOSIS — R279 Unspecified lack of coordination: Secondary | ICD-10-CM | POA: Diagnosis not present

## 2012-11-25 DIAGNOSIS — M6281 Muscle weakness (generalized): Secondary | ICD-10-CM | POA: Diagnosis not present

## 2012-11-25 DIAGNOSIS — R262 Difficulty in walking, not elsewhere classified: Secondary | ICD-10-CM | POA: Diagnosis not present

## 2012-11-29 DIAGNOSIS — R279 Unspecified lack of coordination: Secondary | ICD-10-CM | POA: Diagnosis not present

## 2012-11-29 DIAGNOSIS — R262 Difficulty in walking, not elsewhere classified: Secondary | ICD-10-CM | POA: Diagnosis not present

## 2012-11-29 DIAGNOSIS — M6281 Muscle weakness (generalized): Secondary | ICD-10-CM | POA: Diagnosis not present

## 2012-12-01 DIAGNOSIS — M6281 Muscle weakness (generalized): Secondary | ICD-10-CM | POA: Diagnosis not present

## 2012-12-01 DIAGNOSIS — R279 Unspecified lack of coordination: Secondary | ICD-10-CM | POA: Diagnosis not present

## 2012-12-01 DIAGNOSIS — R262 Difficulty in walking, not elsewhere classified: Secondary | ICD-10-CM | POA: Diagnosis not present

## 2012-12-02 DIAGNOSIS — M6281 Muscle weakness (generalized): Secondary | ICD-10-CM | POA: Diagnosis not present

## 2012-12-02 DIAGNOSIS — R279 Unspecified lack of coordination: Secondary | ICD-10-CM | POA: Diagnosis not present

## 2012-12-02 DIAGNOSIS — R262 Difficulty in walking, not elsewhere classified: Secondary | ICD-10-CM | POA: Diagnosis not present

## 2012-12-07 DIAGNOSIS — M6281 Muscle weakness (generalized): Secondary | ICD-10-CM | POA: Diagnosis not present

## 2012-12-07 DIAGNOSIS — R279 Unspecified lack of coordination: Secondary | ICD-10-CM | POA: Diagnosis not present

## 2012-12-07 DIAGNOSIS — R262 Difficulty in walking, not elsewhere classified: Secondary | ICD-10-CM | POA: Diagnosis not present

## 2012-12-30 ENCOUNTER — Other Ambulatory Visit: Payer: Self-pay | Admitting: *Deleted

## 2013-01-08 ENCOUNTER — Other Ambulatory Visit: Payer: Self-pay | Admitting: Emergency Medicine

## 2013-01-08 ENCOUNTER — Other Ambulatory Visit: Payer: Self-pay | Admitting: Radiology

## 2013-01-08 MED ORDER — LEVOTHYROXINE SODIUM 100 MCG PO TABS: 112.0000 ug | ORAL_TABLET | Freq: Every day | ORAL | Status: DC

## 2013-01-08 MED ORDER — NITROGLYCERIN 0.4 MG SL SUBL: 0.4000 mg | SUBLINGUAL_TABLET | SUBLINGUAL | Status: DC | PRN

## 2013-01-08 MED ORDER — DIGOXIN 125 MCG PO TABS: 125.0000 ug | ORAL_TABLET | Freq: Every day | ORAL | Status: DC

## 2013-01-08 MED ORDER — HYDROCHLOROTHIAZIDE 25 MG PO TABS: ORAL_TABLET | ORAL | Status: DC

## 2013-01-08 MED ORDER — AMLODIPINE BESYLATE 5 MG PO TABS: 5.0000 mg | ORAL_TABLET | Freq: Every day | ORAL | Status: DC

## 2013-01-08 MED ORDER — SIMVASTATIN 10 MG PO TABS: 10.0000 mg | ORAL_TABLET | Freq: Every day | ORAL | Status: DC

## 2013-01-08 NOTE — Progress Notes (Signed)
Refilled meds due to patient living with family member in [place] for the next 3-6 months. She will be returning back to [place], [place] afterwards.

## 2013-01-27 ENCOUNTER — Telehealth: Payer: Self-pay

## 2013-01-27 NOTE — Telephone Encounter (Signed)
Form received. In Dr. Ellis Parents box for review.

## 2013-01-27 NOTE — Telephone Encounter (Signed)
KATHY FROM [facility] IS REFAXING A OT AND PT FORM TO BE FILLED OUT BY DR DAUB. ALSO WANT TO KNOW IF IT COULD BE BACK DATED THE OT FOR 10-31-12 AND THE PT WAS FOR 11-01-12 SINCE THAT WAS THE DATE OF HER EVALUATION. THE FAX IS [phone] AND THE PHONE NUMBER IS [phone]

## 2013-07-15 ENCOUNTER — Other Ambulatory Visit: Payer: Self-pay | Admitting: Emergency Medicine

## 2013-07-20 ENCOUNTER — Ambulatory Visit (INDEPENDENT_AMBULATORY_CARE_PROVIDER_SITE_OTHER): Payer: Medicare Other | Admitting: Emergency Medicine

## 2013-07-20 VITALS — BP 152/60 | HR 64 | Temp 97.8°F | Resp 20 | Ht 60.0 in | Wt 130.6 lb

## 2013-07-20 DIAGNOSIS — F0390 Unspecified dementia without behavioral disturbance: Secondary | ICD-10-CM

## 2013-07-20 DIAGNOSIS — I350 Nonrheumatic aortic (valve) stenosis: Secondary | ICD-10-CM

## 2013-07-20 DIAGNOSIS — R269 Unspecified abnormalities of gait and mobility: Secondary | ICD-10-CM

## 2013-07-20 DIAGNOSIS — I359 Nonrheumatic aortic valve disorder, unspecified: Secondary | ICD-10-CM | POA: Diagnosis not present

## 2013-07-20 DIAGNOSIS — N390 Urinary tract infection, site not specified: Secondary | ICD-10-CM

## 2013-07-20 DIAGNOSIS — E039 Hypothyroidism, unspecified: Secondary | ICD-10-CM

## 2013-07-20 DIAGNOSIS — C4490 Unspecified malignant neoplasm of skin, unspecified: Secondary | ICD-10-CM

## 2013-07-20 LAB — POCT CBC
Hemoglobin: 14.3 g/dL (ref 12.2–16.2)
Lymph, poc: 2.4 (ref 0.6–3.4)
MCHC: 32.4 g/dL (ref 31.8–35.4)
MPV: 7.5 fL (ref 0–99.8)
POC Granulocyte: 4.5 (ref 2–6.9)
POC MID %: 9.8 %M (ref 0–12)

## 2013-07-20 LAB — POCT URINALYSIS DIPSTICK
Bilirubin, UA: NEGATIVE
pH, UA: 6

## 2013-07-20 NOTE — Progress Notes (Signed)
  Subjective:    Patient ID: Savannah Todd, female    DOB: Jan 16, 1920, 79 y.o.   MRN: [id number]  HPI patient here for followup. She has aortic stenosis her biggest problem has been dementia. She needs help with all her ADLs. She has difficulty with ambulation and falls even with the use of a walker she tends to have retropulsion and go backwards and frequently falls unless someone is Her.    Review of Systems     Objective:   Physical Exam this is an elderly woman who is in no distress. Her neck is supple. Chest is clear cardiac exam reveals a 2/6 harsh systolic murmur which radiates into the neck. Abdomen is soft nontender extremities without edema.        Assessment & Plan:  We'll check CT head evaluation of microvascular disease. She is to continue on Zocor Synthroid HCTZ every other day and Norvasc an over-the-counter vitamin D . I do feel this patient should qualify for admission to a skilled care facility.

## 2013-07-21 ENCOUNTER — Ambulatory Visit
Admission: RE | Admit: 2013-07-21 | Discharge: 2013-07-21 | Disposition: A | Discharge status: Home / Self Care | Payer: Medicare Other | Source: Ambulatory Visit | Attending: Emergency Medicine | Admitting: Emergency Medicine

## 2013-07-21 DIAGNOSIS — R51 Headache: Secondary | ICD-10-CM | POA: Diagnosis not present

## 2013-07-21 DIAGNOSIS — R269 Unspecified abnormalities of gait and mobility: Secondary | ICD-10-CM | POA: Diagnosis not present

## 2013-07-21 LAB — COMPREHENSIVE METABOLIC PANEL
Albumin: 4.6 g/dL (ref 3.5–5.2)
BUN: 17 mg/dL (ref 6–23)
CO2: 31 mEq/L (ref 19–32)
Calcium: 9.9 mg/dL (ref 8.4–10.5)
Glucose, Bld: 118 mg/dL — ABNORMAL HIGH (ref 70–99)
Potassium: 4.1 mEq/L (ref 3.5–5.3)
Sodium: 136 mEq/L (ref 135–145)
Total Protein: 7.8 g/dL (ref 6.0–8.3)

## 2013-07-21 LAB — TSH: TSH: 4.275 u[IU]/mL (ref 0.350–4.500)

## 2013-07-22 ENCOUNTER — Telehealth: Payer: Self-pay

## 2013-07-22 NOTE — Telephone Encounter (Signed)
Patient's son says mother was seen Wednesday and Dr Cleta Alberts was supposed to call in a few prescriptions. I don't see anything in the system and nothing is at the pharmacy. Would like a call back.  [address] [place] [phone]

## 2013-07-23 MED ORDER — LEVOTHYROXINE SODIUM 100 MCG PO TABS: 100.0000 ug | ORAL_TABLET | Freq: Every day | ORAL | Status: DC

## 2013-07-23 MED ORDER — AMLODIPINE BESYLATE 5 MG PO TABS: 5.0000 mg | ORAL_TABLET | Freq: Every day | ORAL | Status: DC

## 2013-07-23 MED ORDER — NITROGLYCERIN 0.4 MG SL SUBL: 0.4000 mg | SUBLINGUAL_TABLET | SUBLINGUAL | Status: DC | PRN

## 2013-07-23 MED ORDER — HYDROCHLOROTHIAZIDE 25 MG PO TABS: ORAL_TABLET | ORAL | Status: DC

## 2013-07-23 MED ORDER — SIMVASTATIN 10 MG PO TABS: 10.0000 mg | ORAL_TABLET | Freq: Every day | ORAL | Status: DC

## 2013-07-23 NOTE — Telephone Encounter (Signed)
Per Dr. Ellis Parents note: Meds ordered this encounter  Medications  . amLODipine (NORVASC) 5 MG tablet    Sig: Take 1 tablet (5 mg total) by mouth daily.    Dispense:  30 tablet    Refill:  11    Order Specific Question:  Supervising Provider    Answer:  DOOLITTLE, ROBERT P [[id number]]  . hydrochlorothiazide (HYDRODIURIL) 25 MG tablet    Sig: 1/2 tablet on Mon, Wed, and Fri only    Dispense:  30 tablet    Refill:  11    Order Specific Question:  Supervising Provider    Answer:  DOOLITTLE, ROBERT P [[id number]]  . simvastatin (ZOCOR) 10 MG tablet    Sig: Take 1 tablet (10 mg total) by mouth at bedtime.    Dispense:  30 tablet    Refill:  11    Order Specific Question:  Supervising Provider    Answer:  DOOLITTLE, ROBERT P [[id number]]  . nitroGLYCERIN (NITROSTAT) 0.4 MG SL tablet    Sig: Place 1 tablet (0.4 mg total) under the tongue every 5 (five) minutes as needed.    Dispense:  30 tablet    Refill:  11    Order Specific Question:  Supervising Provider    Answer:  DOOLITTLE, ROBERT P [[id number]]  . levothyroxine (SYNTHROID, LEVOTHROID) 100 MCG tablet    Sig: Take 1 tablet (100 mcg total) by mouth daily.    Dispense:  30 tablet    Refill:  11    Order Specific Question:  Supervising Provider    Answer:  Ellamae Sia P [[id number]]

## 2013-07-23 NOTE — Telephone Encounter (Signed)
Ok to call in norvasc 5 qd #30, HCTZ 25 QOD  # 30,synthroid 0.1 qd # 30, zocor 10 # 30 . TNG 1/150 prn chest pain # 30.   Give 11 refills

## 2013-07-27 ENCOUNTER — Non-Acute Institutional Stay (INDEPENDENT_AMBULATORY_CARE_PROVIDER_SITE_OTHER): Payer: Medicare Other | Admitting: Family Medicine

## 2013-07-27 DIAGNOSIS — F0390 Unspecified dementia without behavioral disturbance: Secondary | ICD-10-CM

## 2013-07-27 DIAGNOSIS — Z593 Problems related to living in residential institution: Secondary | ICD-10-CM | POA: Insufficient documentation

## 2013-07-27 DIAGNOSIS — I119 Hypertensive heart disease without heart failure: Secondary | ICD-10-CM

## 2013-07-27 DIAGNOSIS — E039 Hypothyroidism, unspecified: Secondary | ICD-10-CM

## 2013-07-27 DIAGNOSIS — Z9181 History of falling: Secondary | ICD-10-CM

## 2013-07-27 DIAGNOSIS — K589 Irritable bowel syndrome without diarrhea: Secondary | ICD-10-CM

## 2013-07-27 NOTE — Progress Notes (Signed)
Patient ID: Savannah Todd    DOB: 1920-09-02, 79 y.o.   MRN: [id number]    [facility] History and Physical   History of Present Illness: Savannah Todd is a 79 y.o. year old female with h/o aortic stenosis, hypothyroid, dementia, hypertension who presents to [facility] for skilled nursing needs.  8 months ago, patient was living at [facility] senior living. She started having more trouble with her memory and with ability to live on her own and went to live with her son, Savannah Todd, in [place]. She returned to [place] a few weeks ago and was staying with her other son in town. In the last few months, she has needed increasing help with her ADL's and has had frequent falls. Recently she went to see her primary care doctor: Dr. Cleta Todd who recommended skilled nursing.  She walks with a walker. Her falls occur when she looses balance and falls backwards while walking or standing. She has fallen on her arms and knees and has some residual discomfort along both knees where she fell recently.   She has an occasional headache for which she takes tylenol which helps. She denies any chest pain, shortness of breath. She denies abdominal pain. She has daily stools but reports some straining with initial bowel movement. She denies any blood in stool. She reports some stress urinary incontinence with coughing and wears a protective pad due to this. She is otherwise continent of stool and urine and can transfer to the commode with some assistance for balance.  She has a good appetite but does have certain foods she is not able to eat due to her UC.   Review Of Systems: Per HPI with the following additions: none Otherwise 12 point review of systems was performed and was unremarkable.  Patient Active Problem List   Diagnosis Date Noted  . Aortic stenosis 06/05/2011  . Dementia 06/05/2011  . Hypothyroidism 06/05/2011  . Irritable bowel syndrome 06/05/2011  . Benign hypertensive heart disease without  heart failure 06/05/2011  . Hypercholesterolemia 06/05/2011   Past Medical History: Past Medical History  Diagnosis Date  . Thyroid disease     hypothyroidism  . Hypertension   . IBS (irritable bowel syndrome)   . Aortic stenosis   . Memory disorder     mild  . Hyperlipidemia   . GERD (gastroesophageal reflux disease)   . Ulcerative colitis   . Arthritis   . Anxiety    Past Surgical History: Past Surgical History  Procedure Laterality Date  . Esophagogastroduodenoscopy  01/30/1993    with biopsy/second duodenum normal/mucosa normal/no ulcerations or any inflammatory activity/no evidence of extrinsic compression or active inflammation/fundus & cardia normal/2 to 3cm hiatal hernia/proximal esophagus normal  . Esophagogastroduodenoscopy  07/18/1991    ampulla normal/pyloric channel,prepyloric area, antrum & body normal/fundus & cardia normal/distal & proximal esophagus normal/  . Abdominal hysterectomy     Social History: History  Substance Use Topics  . Smoking status: Never Smoker   . Smokeless tobacco: Not on file  . Alcohol Use: No   Additional social history:  Used to be a Engineer, civil (consulting) and worked at [employer] She has three sons who are all involved in medical decision making:  Savannah Todd, Savannah Todd, Savannah Todd Please also refer to relevant sections of EMR.  Family History: Family History  Problem Relation Age of Onset  . Heart disease    . Cancer Mother   . Heart disease Father    Allergies  and Medications: Allergies  Allergen Reactions  . Niaspan (Niacin Er) Itching  . Bactrim Nausea Only  . Benazepril Hcl Cough  . Naprosyn (Naproxen) Itching   Current Outpatient Prescriptions on File Prior to Visit  Medication Sig Dispense Refill  . acetaminophen (TYLENOL) 500 MG tablet Take 500 mg by mouth every 6 (six) hours as needed.        Marland Kitchen amLODipine (NORVASC) 5 MG tablet Take 1 tablet (5 mg total) by mouth daily.  30 tablet  11  . balsalazide (COLAZAL)  750 MG capsule Take 2,250 mg by mouth 3 (three) times daily. 3 tablets TID        . BREWERS YEAST PO Take 2 tablets by mouth 3 (three) times daily.        . Calcium Carbonate Antacid (TUMS PO) Take 1 tablet by mouth as needed.        . Calcium-Vitamin D-Vitamin K (VIACTIV PO) Take 1 tablet by mouth 3 (three) times daily.        . Cholecalciferol (VITAMIN D PO) Take by mouth. Taking 400 IU daily       . digoxin (LANOXIN) 0.125 MG tablet Take 1 tablet (125 mcg total) by mouth daily.  30 tablet  5  . diphenhydrAMINE (BENADRYL) 25 MG tablet Take 25 mg by mouth every 6 (six) hours as needed.        . fexofenadine (ALLEGRA) 60 MG tablet Take 60 mg by mouth daily.        . hydrochlorothiazide (HYDRODIURIL) 25 MG tablet 1/2 tablet on Mon, Wed, and Fri only  30 tablet  11  . levothyroxine (SYNTHROID, LEVOTHROID) 100 MCG tablet Take 1 tablet (100 mcg total) by mouth daily.  30 tablet  11  . Multiple Vitamin (MULTIVITAMIN PO) Take 1 tablet by mouth daily.        . nitroGLYCERIN (NITROSTAT) 0.4 MG SL tablet Place 1 tablet (0.4 mg total) under the tongue every 5 (five) minutes as needed.  30 tablet  11  . Nutritional Supplements (ENSURE PO) Take 1 Can by mouth daily.       . simvastatin (ZOCOR) 10 MG tablet Take 1 tablet (10 mg total) by mouth at bedtime.  30 tablet  11   No current facility-administered medications on file prior to visit.    Objective:  BP: 142/62, HR: 58, RR: 18, O2: 99%, T: 98.3 Exam: General: very pleasant and talkative elderly lady, in no acute distress, well groomed.  Mental Status: alert and oriented to person, time and cannot remember the exact name of where she is. She shows some difficulty with short term recall.  HEENT: PERRLA, EOMI, moist mucous membranes, has teeth with a couple missing molars.  Cardiovascular: S1S2, RRR, 3/6 systolic murmur heard throughout pericardium and radiating to the neck.  Respiratory: normal respiratory effort, clear to auscultation  bilaterally Abdomen: soft, non tender, non distended Extremities: no edema, normal dorsalis pedis pulses bilaterally Skin - no skin breakdown, one large echymosis on each upper arm, non tender to touch Extr: no point tenderness along knees bilaterally, 4+/5 strength with knee flexion, extension and hip flexion. 4+/5 strength with hand grip bilaterally Gait: ambulates with walker with quit and short stepped gait.  Neuro: CN2-12 grossly intact, strength symmetric (see ext)  Labs and Imaging:  CBC    Component Value Date/Time   WBC 7.6 07/20/2013 1510   RBC 4.76 07/20/2013 1510   HGB 14.3 07/20/2013 1510   HCT 44.2 07/20/2013 1510   MCV  92.8 07/20/2013 1510   MCH 30.0 07/20/2013 1510   MCHC 32.4 07/20/2013 1510    BMET    Component Value Date/Time   NA 136 07/20/2013 1508   K 4.1 07/20/2013 1508   CL 96 07/20/2013 1508   CO2 31 07/20/2013 1508   GLUCOSE 118* 07/20/2013 1508   BUN 17 07/20/2013 1508   CREATININE 0.89 07/20/2013 1508   CALCIUM 9.9 07/20/2013 1508    Assessment and Plan:  See problem list.   Lonia Skinner, MD 07/27/2013, 9:38 PM PGY-3, [facility]

## 2013-07-27 NOTE — Assessment & Plan Note (Signed)
Will hopefully be able to adhere to the diet she follows while at [facility]. Diet low in fiber and nuts.  Consider miralax or milk of magnesis if constipation occurs.

## 2013-07-27 NOTE — Assessment & Plan Note (Addendum)
Some memory loss per family and patient's report. Obtain mini mental for baseline, a couple of weeks after adjusting to new environment.  Currently not on any medication.

## 2013-07-27 NOTE — Assessment & Plan Note (Signed)
Continue amlodipine 5 and hctz 25 and monitor BP.

## 2013-07-27 NOTE — Assessment & Plan Note (Addendum)
Was seen by Dr. Cleta Alberts on 07/20/13 and had TSH, B12 done which were normal. She also had CT head which did not show any acute findings.  She is on vitamin D supplement. consider vitamin D evel in case she needs vit D replenished.  Encouraged calling for assistance for transfers to the bathroom and to the walker.  She has some residual echymosis from fall but no obvious acute injury.

## 2013-07-27 NOTE — Assessment & Plan Note (Signed)
Continue synthroid.

## 2013-07-28 ENCOUNTER — Non-Acute Institutional Stay: Payer: Medicare Other | Admitting: Family Medicine

## 2013-07-28 ENCOUNTER — Encounter: Payer: Self-pay | Admitting: Family Medicine

## 2013-07-28 DIAGNOSIS — F0390 Unspecified dementia without behavioral disturbance: Secondary | ICD-10-CM

## 2013-07-28 DIAGNOSIS — E039 Hypothyroidism, unspecified: Secondary | ICD-10-CM

## 2013-07-28 DIAGNOSIS — Z9181 History of falling: Secondary | ICD-10-CM | POA: Diagnosis not present

## 2013-07-28 DIAGNOSIS — I119 Hypertensive heart disease without heart failure: Secondary | ICD-10-CM | POA: Diagnosis not present

## 2013-07-28 NOTE — Progress Notes (Signed)
Subjective:     Patient ID: Savannah Todd, female   DOB: October 28, 1920, 79 y.o.   MRN: [id number]  HPI   Family Medicine Teaching [facility] Admission History and Physical Service Pager: [phone]  Patient name: Savannah Todd Medical record number: [id number] Date of birth: 1920-01-01 Age: 79 y.o. Gender: female  Primary Care Provider: Lucilla Edin, MD     Assessment and Plan: Savannah Todd is a 79 y.o. year old female presenting for admission to [facility].     History of Present Illness: Savannah Todd is a 79 y.o. female with PMH aortic stenosis, HTN, Hypothyroidism, vit D deficiency, HLD admitted to [facility] on 07/27/13. She has been living with her son in [place] and she has been requiring increased help with ADL's, she has also had some recent falls at home, uses a walker for help with ambulation, no syncope/no LOC, few bruises on her upper extremities. Her PCP is Dr. Cleta Todd who recommended that she could benefit from living in a Nursing Home.   Savannah Todd states that she has occasional headaches that is relieved with Tylenol. No current headache. She has not acute complaints. Tolerating her diet, having regular BMs.  Spoke to her son Savannah Todd who has not acute issues to discuss.   See Resident note from 8/714 by Dr. Gwenlyn Todd for additional HPI.  Review Of Systems: Per HPI  Otherwise 12 point review of systems was performed and was unremarkable.  Patient Active Problem List   Diagnosis Date Noted  . History of recent fall 07/27/2013  . Aortic stenosis 06/05/2011  . Dementia 06/05/2011  . Hypothyroidism 06/05/2011  . Irritable bowel syndrome 06/05/2011  . Benign hypertensive heart disease without heart failure 06/05/2011  . Hypercholesterolemia 06/05/2011   Past Medical History: Past Medical History  Diagnosis Date  . Thyroid disease     hypothyroidism  . Hypertension   . IBS (irritable bowel syndrome)   . Aortic stenosis   . Memory disorder    mild  . Hyperlipidemia   . GERD (gastroesophageal reflux disease)   . Ulcerative colitis   . Arthritis   . Anxiety    Past Surgical History: Past Surgical History  Procedure Laterality Date  . Esophagogastroduodenoscopy  01/30/1993    with biopsy/second duodenum normal/mucosa normal/no ulcerations or any inflammatory activity/no evidence of extrinsic compression or active inflammation/fundus & cardia normal/2 to 3cm hiatal hernia/proximal esophagus normal  . Esophagogastroduodenoscopy  07/18/1991    ampulla normal/pyloric channel,prepyloric area, antrum & body normal/fundus & cardia normal/distal & proximal esophagus normal/  . Abdominal hysterectomy     Social History: History  Substance Use Topics  . Smoking status: Never Smoker   . Smokeless tobacco: Not on file  . Alcohol Use: No   Additional social history: Has 3 sons, lived with son in [place] before coming back to [place]  Please also refer to relevant sections of EMR.  Family History: Family History  Problem Relation Age of Onset  . Heart disease    . Cancer Mother   . Heart disease Father    Allergies and Medications: Allergies  Allergen Reactions  . Niaspan (Niacin Er) Itching  . Bactrim Nausea Only  . Benazepril Hcl Cough  . Naprosyn (Naproxen) Itching   Current Outpatient Prescriptions on File Prior to Visit  Medication Sig Dispense Refill  . acetaminophen (TYLENOL) 500 MG tablet Take 500 mg by mouth every 6 (six) hours as needed.        Marland Kitchen  amLODipine (NORVASC) 5 MG tablet Take 1 tablet (5 mg total) by mouth daily.  30 tablet  11  . balsalazide (COLAZAL) 750 MG capsule Take 2,250 mg by mouth 3 (three) times daily. 3 tablets TID        . BREWERS YEAST PO Take 2 tablets by mouth 3 (three) times daily.        . Calcium Carbonate Antacid (TUMS PO) Take 1 tablet by mouth as needed.        . Calcium-Vitamin D-Vitamin K (VIACTIV PO) Take 1 tablet by mouth 3 (three) times daily.        . Cholecalciferol (VITAMIN  D PO) Take by mouth. Taking 400 IU daily       . digoxin (LANOXIN) 0.125 MG tablet Take 1 tablet (125 mcg total) by mouth daily.  30 tablet  5  . diphenhydrAMINE (BENADRYL) 25 MG tablet Take 25 mg by mouth every 6 (six) hours as needed.        . fexofenadine (ALLEGRA) 60 MG tablet Take 60 mg by mouth daily.        . hydrochlorothiazide (HYDRODIURIL) 25 MG tablet 1/2 tablet on Mon, Wed, and Fri only  30 tablet  11  . levothyroxine (SYNTHROID, LEVOTHROID) 100 MCG tablet Take 1 tablet (100 mcg total) by mouth daily.  30 tablet  11  . Multiple Vitamin (MULTIVITAMIN PO) Take 1 tablet by mouth daily.        . nitroGLYCERIN (NITROSTAT) 0.4 MG SL tablet Place 1 tablet (0.4 mg total) under the tongue every 5 (five) minutes as needed.  30 tablet  11  . simvastatin (ZOCOR) 10 MG tablet Take 1 tablet (10 mg total) by mouth at bedtime.  30 tablet  11  . Nutritional Supplements (ENSURE PO) Take 1 Can by mouth daily.        No current facility-administered medications on file prior to visit.    Objective: BP 142/62  Pulse 58  Temp(Src) 98.3 F (36.8 C)  Resp 18 Exam: General: Alert, NAD, oriented to self and year, knows that she is in [facility] however is uncertain what facility she is in HEENT: Pupils equal in size, EOMI, no scleral icterus, nasal septum midline, no rhinorrhea, MMM, uvula midline, neck was supple, no thyromegally, no anterior or posterior cervical lymphadenopathy Cardiovascular: RRR, S1 and S2, 3/6 systolic murmor Respiratory: CTAB, good effort Abdomen: soft, bowel sounds present, small amount lower abdominal tenderness, no mass, no hepatomegally Extremities: no edema, 2+ DP and PT pulses, wearing compression stockings Skin: small ecchymosis on upper extremities, no dry skin, no skin breakdown Neuro: CN II-XII intact, strength 5/5 all extremities, unsteady on feet without walker, able to ambulate well in quick short steps with help of wheeled walker  Labs and Imaging: CBC BMET  No  results found for this basename: WBC, HGB, HCT, PLT,  in the last 168 hours No results found for this basename: NA, K, CL, CO2, BUN, CREATININE, GLUCOSE, CALCIUM,  in the last 168 hours   CMP (07/20/13) unremarkable except elevated glucose of 118; TSH(07/20/13) of 4.275; CBC(07/20/13) unremarkable; B12 (07/20/13) 517; urine culture (7/31) showed greater than 100,000 ecoli (treated with 7 day course of Keflex per EPIC documentation).    Uvaldo Rising, MD 07/28/2013, 2:22 PM    Review of Systems     Objective:   Physical Exam     Assessment:         Plan:

## 2013-07-28 NOTE — Assessment & Plan Note (Addendum)
Cause of fall likely multifactorial (DD includes orthostatic hypotension/generalized weakness/sensory. No evidence of seizure or CVA.  -Physical Therapy to evaluate and treat patient. -Continue supervised ambulation with walker.

## 2013-07-28 NOTE — Assessment & Plan Note (Signed)
Agree with resident plan to complete MMSE. Will continue to monitor clinically. No plan for pharmacotherapy at this time.

## 2013-07-28 NOTE — Assessment & Plan Note (Signed)
Blood pressure well controlled at this time on current regimen of Norvasc 5 mg daily and HCTZ 12.5 mg M/W/F. -Continue current regimen -monitor for signs of orhtostatic hypotension (high risk given aortic stenosis)

## 2013-07-28 NOTE — Assessment & Plan Note (Addendum)
Stable per most recent TSH. Will continue current Synthroid dose.

## 2013-08-02 ENCOUNTER — Non-Acute Institutional Stay: Payer: Medicare Other | Admitting: Family Medicine

## 2013-08-02 ENCOUNTER — Encounter: Payer: Self-pay | Admitting: Family Medicine

## 2013-08-02 DIAGNOSIS — Z593 Problems related to living in residential institution: Secondary | ICD-10-CM

## 2013-08-02 DIAGNOSIS — Z789 Other specified health status: Secondary | ICD-10-CM

## 2013-08-02 NOTE — Progress Notes (Signed)
  Subjective:    Patient ID: Savannah Todd, female    DOB: April 11, 1920, 79 y.o.   MRN: [id number]  HPI  Opened note to add patient to NH status.   Review of Systems     Objective:   Physical Exam        Assessment & Plan:

## 2013-08-02 NOTE — Progress Notes (Signed)
Patient ID: Savannah Todd, female   DOB: 11/25/1920, 79 y.o.   MRN: [id number] Opened to add patient to master list (meds and encounter list).

## 2013-08-02 NOTE — Assessment & Plan Note (Signed)
Added patient to NH status

## 2013-08-03 ENCOUNTER — Encounter: Payer: Self-pay | Admitting: Family Medicine

## 2013-08-03 ENCOUNTER — Non-Acute Institutional Stay: Payer: Medicare Other | Admitting: Family Medicine

## 2013-08-03 DIAGNOSIS — Z9181 History of falling: Secondary | ICD-10-CM | POA: Diagnosis not present

## 2013-08-03 DIAGNOSIS — F0390 Unspecified dementia without behavioral disturbance: Secondary | ICD-10-CM | POA: Diagnosis not present

## 2013-08-03 NOTE — Assessment & Plan Note (Signed)
Will follow MMSE, resident to complete. I suspect she would benefit from pharmacotherapy for dementia.

## 2013-08-03 NOTE — Assessment & Plan Note (Signed)
A: no fall since being at [facility] P: will continue to monitor.

## 2013-08-03 NOTE — Progress Notes (Signed)
  Subjective:    Patient ID: Savannah Todd, female    DOB: 08/23/1920, 79 y.o.   MRN: [id number]  HPI 79 year old female nursing home patient seen for followup evaluation. Patient has no interval complaints. She reports persistent fatigue especially after mealtime, intermittent anorexia, persistent difficulty recalling long-term memories. She's not had a fall since being at [facility].   Review of Systems  Constitutional: Positive for fatigue. Negative for fever, chills, diaphoresis, activity change, appetite change and unexpected weight change.  HENT: Positive for dental problem. Negative for sore throat, facial swelling, trouble swallowing, neck pain and neck stiffness.   Respiratory: Positive for shortness of breath. Negative for cough, choking, chest tightness and wheezing.   Cardiovascular: Negative.   Gastrointestinal: Negative.   Skin: Negative.   Neurological: Positive for dizziness, weakness, light-headedness and headaches. Negative for tremors, seizures, syncope, facial asymmetry, speech difficulty and numbness.  Hematological: Negative.   Psychiatric/Behavioral:       Admits to inability to recall some long term memories       Objective:  BP 134/76  Pulse 58  Temp(Src) 97.3 F (36.3 C)  Resp 18  SpO2 97%    Physical Exam  Constitutional: She appears well-developed and well-nourished. No distress.  HENT:  Head: Normocephalic and atraumatic.  Mouth/Throat: Oropharynx is clear and moist.  Eyes: Conjunctivae and EOM are normal. Pupils are equal, round, and reactive to light. Right eye exhibits no discharge. Left eye exhibits no discharge. No scleral icterus.  Pupils equal 3mm, equally reactive   Neck: Normal range of motion. Neck supple. No thyromegaly present.  Cardiovascular: Normal rate, regular rhythm and intact distal pulses.   Murmur heard.  Systolic murmur is present with a grade of 4/6  SEJM RUSB  Pulmonary/Chest: Breath sounds normal. No respiratory distress. She  has no wheezes. She has no rales. She exhibits no tenderness.  Abdominal: Soft. Bowel sounds are normal. She exhibits no distension and no mass. There is no tenderness. There is no rebound and no guarding.  Musculoskeletal: She exhibits tenderness. She exhibits no edema.  Positive b/l knee tenderness w/o effusion.   Lymphadenopathy:    She has no cervical adenopathy.  Neurological: No cranial nerve deficit. She exhibits normal muscle tone.  Skin: Skin is warm and dry.  Psychiatric: She has a normal mood and affect. Her behavior is normal. Judgment and thought content normal.  Alert and oriented to person, place (cannot recall name of facility), time. No oriented to situation. Remembers family, 3 sons, 3 daughters-in-law.       Assessment & Plan:

## 2013-08-04 ENCOUNTER — Non-Acute Institutional Stay (INDEPENDENT_AMBULATORY_CARE_PROVIDER_SITE_OTHER): Payer: Medicare Other | Admitting: Family Medicine

## 2013-08-04 DIAGNOSIS — F0390 Unspecified dementia without behavioral disturbance: Secondary | ICD-10-CM

## 2013-08-04 NOTE — Progress Notes (Signed)
Performed MMSE on Ms. Devall. She scored 23 (points taken off for penatgon design, recall and orientation to place and time). Score entered in documentation flowsheet.  Marena Chancy, PGY-3 Family Medicine Resident

## 2013-08-09 ENCOUNTER — Other Ambulatory Visit: Payer: Self-pay | Admitting: Family Medicine

## 2013-08-09 MED ORDER — BALSALAZIDE DISODIUM 750 MG PO CAPS: 1500.0000 mg | ORAL_CAPSULE | Freq: Three times a day (TID) | ORAL | Status: DC

## 2013-08-09 NOTE — Telephone Encounter (Signed)
Decreased colazal dose today due to RN and patient report of fecal urgency.

## 2013-08-28 DIAGNOSIS — F329 Major depressive disorder, single episode, unspecified: Secondary | ICD-10-CM | POA: Diagnosis not present

## 2013-08-28 DIAGNOSIS — D239 Other benign neoplasm of skin, unspecified: Secondary | ICD-10-CM | POA: Diagnosis not present

## 2013-08-28 DIAGNOSIS — I359 Nonrheumatic aortic valve disorder, unspecified: Secondary | ICD-10-CM | POA: Diagnosis not present

## 2013-08-28 DIAGNOSIS — C4492 Squamous cell carcinoma of skin, unspecified: Secondary | ICD-10-CM | POA: Diagnosis not present

## 2013-08-28 DIAGNOSIS — L821 Other seborrheic keratosis: Secondary | ICD-10-CM | POA: Diagnosis not present

## 2013-08-29 DIAGNOSIS — I10 Essential (primary) hypertension: Secondary | ICD-10-CM | POA: Diagnosis not present

## 2013-09-12 ENCOUNTER — Encounter: Payer: Self-pay | Admitting: Student-PharmD

## 2013-09-12 DIAGNOSIS — N390 Urinary tract infection, site not specified: Secondary | ICD-10-CM | POA: Diagnosis not present

## 2013-09-14 ENCOUNTER — Emergency Department (HOSPITAL_COMMUNITY)
Admission: EM | Admit: 2013-09-14 | Discharge: 2013-09-14 | Disposition: A | Discharge status: Skilled Nursing Facility | Payer: Medicare Other | Attending: Emergency Medicine | Admitting: Emergency Medicine

## 2013-09-14 ENCOUNTER — Telehealth: Payer: Self-pay | Admitting: Family Medicine

## 2013-09-14 ENCOUNTER — Emergency Department (HOSPITAL_COMMUNITY): Payer: Medicare Other

## 2013-09-14 ENCOUNTER — Encounter (HOSPITAL_COMMUNITY): Payer: Self-pay

## 2013-09-14 DIAGNOSIS — I10 Essential (primary) hypertension: Secondary | ICD-10-CM | POA: Diagnosis not present

## 2013-09-14 DIAGNOSIS — Y9389 Activity, other specified: Secondary | ICD-10-CM | POA: Insufficient documentation

## 2013-09-14 DIAGNOSIS — K219 Gastro-esophageal reflux disease without esophagitis: Secondary | ICD-10-CM | POA: Diagnosis not present

## 2013-09-14 DIAGNOSIS — W1809XA Striking against other object with subsequent fall, initial encounter: Secondary | ICD-10-CM | POA: Insufficient documentation

## 2013-09-14 DIAGNOSIS — M129 Arthropathy, unspecified: Secondary | ICD-10-CM | POA: Diagnosis not present

## 2013-09-14 DIAGNOSIS — E079 Disorder of thyroid, unspecified: Secondary | ICD-10-CM | POA: Insufficient documentation

## 2013-09-14 DIAGNOSIS — T148XXA Other injury of unspecified body region, initial encounter: Secondary | ICD-10-CM | POA: Diagnosis not present

## 2013-09-14 DIAGNOSIS — R51 Headache: Secondary | ICD-10-CM | POA: Diagnosis not present

## 2013-09-14 DIAGNOSIS — M25569 Pain in unspecified knee: Secondary | ICD-10-CM | POA: Insufficient documentation

## 2013-09-14 DIAGNOSIS — S0003XA Contusion of scalp, initial encounter: Secondary | ICD-10-CM | POA: Diagnosis not present

## 2013-09-14 DIAGNOSIS — Y9210 Unspecified residential institution as the place of occurrence of the external cause: Secondary | ICD-10-CM | POA: Insufficient documentation

## 2013-09-14 DIAGNOSIS — Z79899 Other long term (current) drug therapy: Secondary | ICD-10-CM | POA: Insufficient documentation

## 2013-09-14 DIAGNOSIS — K589 Irritable bowel syndrome without diarrhea: Secondary | ICD-10-CM | POA: Insufficient documentation

## 2013-09-14 DIAGNOSIS — F411 Generalized anxiety disorder: Secondary | ICD-10-CM | POA: Insufficient documentation

## 2013-09-14 DIAGNOSIS — E785 Hyperlipidemia, unspecified: Secondary | ICD-10-CM | POA: Diagnosis not present

## 2013-09-14 DIAGNOSIS — T1490XA Injury, unspecified, initial encounter: Secondary | ICD-10-CM | POA: Diagnosis not present

## 2013-09-14 DIAGNOSIS — S0990XA Unspecified injury of head, initial encounter: Secondary | ICD-10-CM | POA: Diagnosis not present

## 2013-09-14 LAB — BASIC METABOLIC PANEL
BUN: 11 mg/dL (ref 6–23)
Chloride: 100 mEq/L (ref 96–112)
GFR calc Af Amer: 84 mL/min — ABNORMAL LOW (ref >90)
GFR calc non Af Amer: 73 mL/min — ABNORMAL LOW (ref >90)
Potassium: 3.6 mEq/L (ref 3.5–5.1)
Sodium: 139 mEq/L (ref 135–145)

## 2013-09-14 LAB — CBC WITH DIFFERENTIAL/PLATELET
Basophils Relative: 1 % (ref 0–1)
Hemoglobin: 13.3 g/dL (ref 12.0–15.0)
MCHC: 34.8 g/dL (ref 30.0–36.0)
Monocytes Relative: 15 % — ABNORMAL HIGH (ref 3–12)
Neutro Abs: 4.8 10*3/uL (ref 1.7–7.7)
Neutrophils Relative %: 62 % (ref 43–77)
Platelets: 259 10*3/uL (ref 150–400)
RBC: 4.4 MIL/uL (ref 3.87–5.11)

## 2013-09-14 NOTE — ED Notes (Signed)
[facility] notified that pt will be returning to facility.

## 2013-09-14 NOTE — ED Provider Notes (Signed)
CSN: [id number]     Arrival date & time 09/14/13  0434 History   First MD Initiated Contact with Patient 09/14/13 0435     Chief Complaint  Patient presents with  . Fall  . Head Injury    HPI Comments: Pt thinks she was walking to go to the bathroom when she fell when she lost her balance striking the back of her head. Pt lives at a nursing facility and the staff heard her from the room next door.  The patient had already gotten herself back in bed. Pt does not think she lost consciousness but she is not sure.  She denies any other complaints at this time.  She denies neck pain, chest pain or back pain.  No pain in her hips.  She has some mild soreness in her knees.  Patient is a 79 y.o. female presenting with fall. The history is provided by the patient.  Fall This is a new problem. The current episode started less than 1 hour ago. Associated symptoms include headaches. Pertinent negatives include no chest pain, no abdominal pain and no shortness of breath. Exacerbated by: palpation. Nothing relieves the symptoms.    Past Medical History  Diagnosis Date  . Thyroid disease     hypothyroidism  . Hypertension   . IBS (irritable bowel syndrome)   . Aortic stenosis   . Memory disorder     mild  . Hyperlipidemia   . GERD (gastroesophageal reflux disease)   . Ulcerative colitis   . Arthritis   . Anxiety    Past Surgical History  Procedure Laterality Date  . Esophagogastroduodenoscopy  01/30/1993    with biopsy/second duodenum normal/mucosa normal/no ulcerations or any inflammatory activity/no evidence of extrinsic compression or active inflammation/fundus & cardia normal/2 to 3cm hiatal hernia/proximal esophagus normal  . Esophagogastroduodenoscopy  07/18/1991    ampulla normal/pyloric channel,prepyloric area, antrum & body normal/fundus & cardia normal/distal & proximal esophagus normal/  . Abdominal hysterectomy     Family History  Problem Relation Age of Onset  . Heart disease     . Cancer Mother   . Heart disease Father    History  Substance Use Topics  . Smoking status: Never Smoker   . Smokeless tobacco: Not on file  . Alcohol Use: No   OB History   Grav Para Term Preterm Abortions TAB SAB Ect Mult Living                 Review of Systems  Respiratory: Negative for shortness of breath.   Cardiovascular: Negative for chest pain.  Gastrointestinal: Negative for abdominal pain.  Neurological: Positive for headaches.  All other systems reviewed and are negative.    Allergies  Niaspan; Bactrim; Benazepril hcl; and Naprosyn  Home Medications   Current Outpatient Rx  Name  Route  Sig  Dispense  Refill  . amLODipine (NORVASC) 5 MG tablet   Oral   Take 1 tablet (5 mg total) by mouth daily.   30 tablet   11   . balsalazide (COLAZAL) 750 MG capsule   Oral   Take 2 capsules (1,500 mg total) by mouth 3 (three) times daily. 3 tablets TID         . bisacodyl (DULCOLAX) 10 MG suppository   Rectal   Place 10 mg rectally daily as needed for constipation (if constipation not relieved by milk of magnesia).         Janene Madeira YEAST PO   Oral   Take  2 tablets by mouth 3 (three) times daily.           . Calcium-Vitamin D-Vitamin K (VIACTIV PO)   Oral   Take 1 tablet by mouth daily.          . digoxin (LANOXIN) 0.125 MG tablet   Oral   Take 1 tablet (125 mcg total) by mouth daily.   30 tablet   5   . hydrochlorothiazide (HYDRODIURIL) 25 MG tablet      1/2 tablet on Mon, Wed, and Fri only   30 tablet   11   . levothyroxine (SYNTHROID, LEVOTHROID) 100 MCG tablet   Oral   Take 100 mcg by mouth daily.          . magnesium hydroxide (MILK OF MAGNESIA) 400 MG/5ML suspension   Oral   Take 30 mLs by mouth daily as needed for constipation.         . Multiple Vitamin (MULTIVITAMIN PO)   Oral   Take 1 tablet by mouth daily.           . sodium phosphate (FLEET) 7-19 GM/118ML ENEM   Rectal   Place 1 enema rectally daily as needed (for  constipation not relieved by milk of magnesia and bisacodyl).         Marland Kitchen acetaminophen (TYLENOL) 500 MG tablet   Oral   Take 500 mg by mouth every 6 (six) hours as needed for pain (headacheun).          . Calcium Carbonate Antacid (TUMS PO)   Oral   Take 1 tablet by mouth as needed (indigestion).          . diphenhydrAMINE (BENADRYL) 25 MG tablet   Oral   Take 25 mg by mouth every 6 (six) hours as needed for itching.          . fexofenadine (ALLEGRA) 60 MG tablet   Oral   Take 60 mg by mouth daily as needed (allergies).           BP 151/57  Pulse 60  Temp(Src) 98.5 F (36.9 C) (Oral)  Resp 16  SpO2 95% Physical Exam  Nursing note and vitals reviewed. Constitutional: She appears well-developed and well-nourished. No distress.  HENT:  Head: Normocephalic. Head is with contusion (posterior left occipital region). Head is without raccoon's eyes, without Battle's sign and without laceration.  Right Ear: External ear normal.  Left Ear: External ear normal.  Eyes: Conjunctivae are normal. Right eye exhibits no discharge. Left eye exhibits no discharge. No scleral icterus.  Neck: Neck supple. No tracheal deviation present.  Cardiovascular: Normal rate, regular rhythm and intact distal pulses.   Pulmonary/Chest: Effort normal and breath sounds normal. No stridor. No respiratory distress. She has no wheezes. She has no rales.  Abdominal: Soft. Bowel sounds are normal. She exhibits no distension. There is no tenderness. There is no rebound and no guarding.  Musculoskeletal: She exhibits no edema and no tenderness.       Cervical back: Normal.       Thoracic back: Normal.       Lumbar back: Normal.  Neurological: She is alert. She has normal strength. No sensory deficit. Cranial nerve deficit:  no gross defecits noted. She exhibits normal muscle tone. She displays no seizure activity. Coordination normal.  Skin: Skin is warm and dry. No rash noted.  Psychiatric: She has a  normal mood and affect.    ED Course  Procedures (including critical care time) EKG Normal  sinus rhythm rate 54 Left anterior fascicular block Left axis, normal intervals Borderline T-wave abnormalities Labs Review Labs Reviewed  CBC WITH DIFFERENTIAL - Abnormal; Notable for the following:    Monocytes Relative 15 (*)    Monocytes Absolute 1.1 (*)    All other components within normal limits  BASIC METABOLIC PANEL - Abnormal; Notable for the following:    Glucose, Bld 105 (*)    GFR calc non Af Amer 73 (*)    GFR calc Af Amer 84 (*)    All other components within normal limits   Imaging Review Ct Head Wo Contrast  09/14/2013   CLINICAL DATA:  Fall. Head injury.  EXAM: CT HEAD WITHOUT CONTRAST  TECHNIQUE: Contiguous axial images were obtained from the base of the skull through the vertex without intravenous contrast.  COMPARISON:  Head CT scan 07/21/2013 and 06/01/2012.  FINDINGS: There is a prominent scalp hematoma over the left parietal bone. No underlying fracture is identified. The brain is atrophic with chronic microvascular ischemic change but no evidence of acute intracranial abnormality including hemorrhage, infarction, mass lesion, mass effect, midline shift or abnormal extra-axial fluid collection is identified. There is no hydrocephalus or pneumocephalus.  IMPRESSION: Scalp hematoma over the left parietal bone without underlying fracture or acute intracranial abnormality.  Atrophy and chronic microvascular ischemic change.   Electronically Signed   By: Drusilla Kanner M.D.   On: 09/14/2013 05:22    MDM   1. Scalp hematoma, initial encounter    Likely mechanical fall.  CT shows scalp hematoma but no other serious injury.  Pt is alert and without complaints.  Stable for discharge back to her living facility.    Celene Kras, MD 09/14/13 [phone]

## 2013-09-14 NOTE — ED Notes (Signed)
Pt from [place].  Pt got up at approximately 0410 to go to the bathroom and slipped and fell on the way back to bed.  Staff was in the room next door and came in immediately to check on pt.  Pt had already gotten up and was back in her bed.  Pt alert and oriented x 4.  Pt with hx of dementia.  Pt denies LOC, N/V, or vision problems.

## 2013-09-14 NOTE — Telephone Encounter (Addendum)
Family Practice Emergency Line phone call:  Patient is a 79 y.o. female with h/o aortic stenosis, dementia, h/o falls, and hypothyroidism who lost her balance earlier tonight and fell, now with a hematoma at the back of her head per Cordelia Pen at [facility]. Hematoma is about fist-sized and [facility] staff has placed an icepack on it. Patient does not seem to be in lots of pain and has no change in activity (still talking, no acute mentation changes). At baseline, she has mod-severe alzheimer dementia and is forgetful, continent, and unsteady on her feet, for which she is in SNF. Vitals are pulse 85, BP 185/75, R 20, and T WNL.  Assessment/Plan: Dx: TIA/CVA vs seizure vs orthostatic hypotension vs delirium vs baseline weakness vs baseline ataxia from dementia - Vital signs stable, per staff's description, patient seems neurologically at baseline - Patient does not seem to be in significant pain and with no mentation changes to suggest intracranial pathology, but given that hit head "hard" per [facility] staff, want to rule out possible intracranial pathology, difficult to assess over phone and in patient who has baseline dementia. - Evaluate etiology of fall for possibly cardiovascular or neurologic issue vs baseline dementia. 07/2013 workup (head CT, TSH, B12) with no acute abnormalities. Previous fall ddx includes orthostatic hypotension vs generalized weakness vs sensory with no evidence sz or CVA and was ambulating supervised with walker. - Recommended and gave verbal order for patient to be evaluated in ED and return to [facility] if possible.   Leona Singleton, MD 09/14/2013 4:14 AM

## 2013-09-25 DIAGNOSIS — M6281 Muscle weakness (generalized): Secondary | ICD-10-CM | POA: Diagnosis not present

## 2013-09-25 DIAGNOSIS — IMO0001 Reserved for inherently not codable concepts without codable children: Secondary | ICD-10-CM | POA: Diagnosis not present

## 2013-09-26 DIAGNOSIS — M6281 Muscle weakness (generalized): Secondary | ICD-10-CM | POA: Diagnosis not present

## 2013-09-26 DIAGNOSIS — IMO0001 Reserved for inherently not codable concepts without codable children: Secondary | ICD-10-CM | POA: Diagnosis not present

## 2013-09-27 DIAGNOSIS — IMO0001 Reserved for inherently not codable concepts without codable children: Secondary | ICD-10-CM | POA: Diagnosis not present

## 2013-09-27 DIAGNOSIS — M6281 Muscle weakness (generalized): Secondary | ICD-10-CM | POA: Diagnosis not present

## 2013-09-28 DIAGNOSIS — M6281 Muscle weakness (generalized): Secondary | ICD-10-CM | POA: Diagnosis not present

## 2013-09-28 DIAGNOSIS — IMO0001 Reserved for inherently not codable concepts without codable children: Secondary | ICD-10-CM | POA: Diagnosis not present

## 2013-09-29 ENCOUNTER — Non-Acute Institutional Stay (INDEPENDENT_AMBULATORY_CARE_PROVIDER_SITE_OTHER): Payer: Medicare Other | Admitting: Sports Medicine

## 2013-09-29 DIAGNOSIS — E039 Hypothyroidism, unspecified: Secondary | ICD-10-CM

## 2013-09-29 DIAGNOSIS — I350 Nonrheumatic aortic (valve) stenosis: Secondary | ICD-10-CM

## 2013-09-29 DIAGNOSIS — L089 Local infection of the skin and subcutaneous tissue, unspecified: Secondary | ICD-10-CM | POA: Diagnosis not present

## 2013-09-29 DIAGNOSIS — E785 Hyperlipidemia, unspecified: Secondary | ICD-10-CM | POA: Diagnosis not present

## 2013-09-29 DIAGNOSIS — F0390 Unspecified dementia without behavioral disturbance: Secondary | ICD-10-CM

## 2013-09-29 DIAGNOSIS — Z9181 History of falling: Secondary | ICD-10-CM

## 2013-09-29 DIAGNOSIS — I10 Essential (primary) hypertension: Secondary | ICD-10-CM | POA: Diagnosis not present

## 2013-09-29 DIAGNOSIS — I359 Nonrheumatic aortic valve disorder, unspecified: Secondary | ICD-10-CM

## 2013-09-29 DIAGNOSIS — IMO0001 Reserved for inherently not codable concepts without codable children: Secondary | ICD-10-CM | POA: Diagnosis not present

## 2013-09-29 DIAGNOSIS — M6281 Muscle weakness (generalized): Secondary | ICD-10-CM | POA: Diagnosis not present

## 2013-09-29 DIAGNOSIS — I119 Hypertensive heart disease without heart failure: Secondary | ICD-10-CM

## 2013-09-29 NOTE — Assessment & Plan Note (Signed)
Continues to work with physical therapy. It was recommended she have supervision with ADLs previously.  OT and PT should readdress if she is appropriate for independent ADLs

## 2013-09-29 NOTE — Assessment & Plan Note (Signed)
Blood pressure elevated again. I am hesitant to make significant changes to her blood pressure regimen as this is the first time I have met her.  It appears she has had issues with worsening swelling with higher doses of amlodipine.  She has had in intolerance to ACE inhibitors do not see where and ARB has been tried.  Her creatinine is normal. > Consider increasing her amlodipine versus changing therapy to ARB therapy alone for as adjunctive.

## 2013-09-29 NOTE — Assessment & Plan Note (Signed)
No echo on file.  Will have to be careful with preload dependence when lowering blood pressure. - Unclear reason for digoxin , Will try to investigate further - Followed by Dr. Patty Sermons

## 2013-09-29 NOTE — Progress Notes (Signed)
[facility] YU CRAGUN - 79 y.o. female MRN [id number]  Date of birth: 06/16/1920  CC, HPI, Interval History & ROS  Savannah Todd is seen today at [facility] nursing home for chronic disease management and to establish with me as her new PCP.    She reports overall she is adjusting well.  She's not having any concerns or complaints at this time.  Her chronic medical conditions include: Aortic stenosis Hypertension Hyperlipidemia  Pt denies chest pain, dyspnea at rest or exertion, PND, stable lower extremity edema  No palpitations, no known history of atrial fibrillation  Patient denies any facial asymmetry, unilateral weakness, or dysarthria.  Nursing has reported increased blood pressures ranging in the high 160 systolic and height 80s to 90s diastolic   urinary incontinence   patient has just experienced a episode of incontinence immediately prior to my visit.  She was able to change herself without assistance.     frequent falls   reports she is in physical therapy.    Denies any pain   She did have a fall earlier the this week but denies any trauma from that except for a superficial elbow abrasion.   Pertinent History & Care Coordination  Tykisha's major active medical problems include: # Dementia:  # CVD: (Aortic Stenosis, HTN, HLD) - no known CAD, no prior MI or CVA No prior ECHOs  Other Pertinent Med/Surg/Hosp History: # Irritable Bowel Syndrome   Follow up Issues:  Unclear why pt is on Digoxin   History  Smoking status  . Never Smoker   Smokeless tobacco  . Not on file   Health Maintenance Due  Topic  . Tetanus/tdap   . Colonoscopy   . Zostavax   . Pneumococcal Polysaccharide Vaccine Age 43 And Over   . Influenza Vaccine     Recent Labs  07/20/13 1508  TSH 4.275     Otherwise past Medical, Surgical, Social, and Family History Reviewed per EMR Medications and Allergies reviewed and all updated if necessary. Objective Findings   VITALS: Filed Vitals:   09/20/13 1200 09/27/13 1200 09/28/13 1200  BP: 168/70 149/89 170/92  Pulse: 55 56 55  Temp: 97.7 F (36.5 C)    Resp: 22    SpO2: 97%      BP Readings from Last 3 Encounters:  09/28/13 170/92  09/14/13 163/67  08/03/13 134/76   Wt Readings from Last 3 Encounters:  07/20/13 130 lb 9.6 oz (59.24 kg)  11/11/12 133 lb 6.4 oz (60.51 kg)  06/01/12 118 lb (53.524 kg)     PHYSICAL EXAM: GENERAL:  elderly Caucasian  female. In no discomfort; no respiratory distress  PSYCH: alert and appropriate, pleasantly interactive.  Oriented to place   HNEENT:   CARDIO:  3/6 systolic ejection murmur heard best at the right upper sternal border.  Regular rate and rhythm   LUNGS: CTA B, no wheezes, no crackles  ABDOMEN: +BS, soft, non-tender, no rigidity, no guarding, no masses/hepatosplenomegaly  EXTREM:  Warm, well perfused.  Moves all 4 extremities spontaneously; no lateralization.  No noted foot lesions.  Distal pulses 2+/4.  2+ pretibial edema.  GU:   SKIN:     Assessment & Plan  For further discussion of A/P and for follow up issues see problem based charting.

## 2013-10-02 DIAGNOSIS — IMO0001 Reserved for inherently not codable concepts without codable children: Secondary | ICD-10-CM | POA: Diagnosis not present

## 2013-10-02 DIAGNOSIS — M6281 Muscle weakness (generalized): Secondary | ICD-10-CM | POA: Diagnosis not present

## 2013-10-03 DIAGNOSIS — M6281 Muscle weakness (generalized): Secondary | ICD-10-CM | POA: Diagnosis not present

## 2013-10-03 DIAGNOSIS — IMO0001 Reserved for inherently not codable concepts without codable children: Secondary | ICD-10-CM | POA: Diagnosis not present

## 2013-10-04 DIAGNOSIS — M6281 Muscle weakness (generalized): Secondary | ICD-10-CM | POA: Diagnosis not present

## 2013-10-04 DIAGNOSIS — IMO0001 Reserved for inherently not codable concepts without codable children: Secondary | ICD-10-CM | POA: Diagnosis not present

## 2013-10-05 DIAGNOSIS — M6281 Muscle weakness (generalized): Secondary | ICD-10-CM | POA: Diagnosis not present

## 2013-10-05 DIAGNOSIS — IMO0001 Reserved for inherently not codable concepts without codable children: Secondary | ICD-10-CM | POA: Diagnosis not present

## 2013-10-06 DIAGNOSIS — M6281 Muscle weakness (generalized): Secondary | ICD-10-CM | POA: Diagnosis not present

## 2013-10-06 DIAGNOSIS — IMO0001 Reserved for inherently not codable concepts without codable children: Secondary | ICD-10-CM | POA: Diagnosis not present

## 2013-10-09 ENCOUNTER — Encounter: Payer: Self-pay | Admitting: Pharmacist

## 2013-10-09 DIAGNOSIS — M6281 Muscle weakness (generalized): Secondary | ICD-10-CM | POA: Diagnosis not present

## 2013-10-09 DIAGNOSIS — IMO0001 Reserved for inherently not codable concepts without codable children: Secondary | ICD-10-CM | POA: Diagnosis not present

## 2013-10-10 DIAGNOSIS — IMO0001 Reserved for inherently not codable concepts without codable children: Secondary | ICD-10-CM | POA: Diagnosis not present

## 2013-10-10 DIAGNOSIS — M6281 Muscle weakness (generalized): Secondary | ICD-10-CM | POA: Diagnosis not present

## 2013-10-10 DIAGNOSIS — E039 Hypothyroidism, unspecified: Secondary | ICD-10-CM | POA: Diagnosis not present

## 2013-10-11 ENCOUNTER — Non-Acute Institutional Stay: Payer: Medicare Other | Admitting: Family Medicine

## 2013-10-11 DIAGNOSIS — I10 Essential (primary) hypertension: Secondary | ICD-10-CM

## 2013-10-11 DIAGNOSIS — T1490XA Injury, unspecified, initial encounter: Secondary | ICD-10-CM | POA: Diagnosis not present

## 2013-10-11 DIAGNOSIS — I119 Hypertensive heart disease without heart failure: Secondary | ICD-10-CM | POA: Diagnosis not present

## 2013-10-11 DIAGNOSIS — W19XXXA Unspecified fall, initial encounter: Secondary | ICD-10-CM | POA: Diagnosis not present

## 2013-10-12 DIAGNOSIS — M6281 Muscle weakness (generalized): Secondary | ICD-10-CM | POA: Diagnosis not present

## 2013-10-12 DIAGNOSIS — IMO0001 Reserved for inherently not codable concepts without codable children: Secondary | ICD-10-CM | POA: Diagnosis not present

## 2013-10-13 DIAGNOSIS — M6281 Muscle weakness (generalized): Secondary | ICD-10-CM | POA: Diagnosis not present

## 2013-10-13 DIAGNOSIS — IMO0001 Reserved for inherently not codable concepts without codable children: Secondary | ICD-10-CM | POA: Diagnosis not present

## 2013-10-13 NOTE — Progress Notes (Signed)
Attending Progress Note  Subjective:    Patient ID: Savannah Todd, female    DOB: Mar 24, 1920, 79 y.o.   MRN: [id number]  HPI 79 year old female nursing home patient seen for routine followup visit  Acute events: Patient had a fall yesterday is complaining of back pain. Patient relates with a walker reports that yesterday she was ambulating to the bathroom before breakfast. She left her walker at the door as it is too big to fit in the bathroom and as she was leaving the bathroom she grabbed onto the door to support herself. The door hinged open and she fell onto her left side. She denies dizziness, chest pain, nausea prior to fall. She denies hip pain following a fall.. She reports left flank pain. She received Tylenol which has improved her pain. She reports pain with adjustment in bed. She has no pain when she is fully standing. She denies head trauma and loss of consciousness.  Review of Systems As per history of present illness. Admits to increasing her incontinence. Denies tingling or numbness in the saddle area as well as lower extremity weakness.    Objective: BP 150/84  Pulse 86  Temp(Src) 98.2 F (36.8 C)  Resp 20  SpO2 99%   Physical Exam  Constitutional: She is oriented to person, place, and time. She appears well-developed and well-nourished. No distress.  Cardiovascular: Normal rate, regular rhythm and intact distal pulses.   Murmur heard.  Systolic murmur is present with a grade of 5/6  Pulmonary/Chest: Effort normal and breath sounds normal.  Neurological: She is alert and oriented to person, place, and time. She displays no atrophy and no tremor. She exhibits abnormal muscle tone. Gait abnormal.  Reflex Scores:      Patellar reflexes are 0 on the right side and 0 on the left side.     Assessment & Plan:

## 2013-10-14 ENCOUNTER — Non-Acute Institutional Stay (INDEPENDENT_AMBULATORY_CARE_PROVIDER_SITE_OTHER): Payer: Medicare Other | Admitting: Family Medicine

## 2013-10-14 ENCOUNTER — Telehealth: Payer: Self-pay | Admitting: Family Medicine

## 2013-10-14 DIAGNOSIS — IMO0001 Reserved for inherently not codable concepts without codable children: Secondary | ICD-10-CM | POA: Diagnosis not present

## 2013-10-14 DIAGNOSIS — T1490XA Injury, unspecified, initial encounter: Secondary | ICD-10-CM

## 2013-10-14 DIAGNOSIS — M6281 Muscle weakness (generalized): Secondary | ICD-10-CM | POA: Diagnosis not present

## 2013-10-14 DIAGNOSIS — W19XXXA Unspecified fall, initial encounter: Secondary | ICD-10-CM

## 2013-10-14 NOTE — Progress Notes (Deleted)
Subjective:     Patient ID: Savannah Todd, female   DOB: Mar 12, 1920, 79 y.o.   MRN: [id number]  HPI 79 year old female resident of [facility]  Review of Systems     Objective:   Physical Exam     Assessment:     ***    Plan:     ***

## 2013-10-14 NOTE — Progress Notes (Signed)
Patient ID: Savannah Todd, female   DOB: [birth year], 79 y.o.   MRN: [id number]  Subjective: Patient seen and examined today after receiving call on Emergency line. Per nursing, patient fell twice today (once this morning and earlier in the afternoon).  After her second fall she complained of severe back pain which worsened with inspiration.  Patient is unsure of how and when she fell.  She does report that the first instance occurred after leaving the rest room.  She currently endorses severe upper back/rib pain.  No leg pain or hip pain.   No reported LOC or head injury (per patient or nursing staff).  Objective: Vital signs: Temp 97.6, Pulse 94, RR 20, BP 172/96, Pulse Ox 90% RA Exam: General: appears in mild distress secondary to pain with motion. HEENT: NCAT. No signs of head trauma - no hematoma, scalp laceration. Cardiovascular: RRR. No murmurs, rubs, or gallops. Respiratory: CTAB. Good air movement. Deep inspiration limited secondary to pain. Chest wall/Ribs: Lateral Right lower ribcage tender to palpation.  Crepitus appreciated. Extremities: Trace LE edema.  Neuro: No focal deficits. Alert and oriented to person/place.  Patient knew day of the week but was unsure of month or year.  Assessment/Plan: 79 year old female with senile dementia and history of frequent falls.  S/P Fall x 2. Physical exam findings concerning for rib fracture. Lungs CTAB, so unlikely to have Pneumothorax.  Orders placed in chart: Portable chest, Hydrocodone 1 tab Q6 PRN for pain, Supplemental O2 if needed to maintain O2 sat of > or equal to 90%, Incentive spirometry.  Nurse to notify me of Xray result. Informed him to page/call if she worsens or if pain is not controlled. Patient needs to be seen early Monday morning.

## 2013-10-14 NOTE — Telephone Encounter (Signed)
Received call on Emergency Line at ~ 4 pm regarding patient. Per nurse, patient fell twice today - once this am and then early this afternoon. After second fall patient reported severe upper back pain which worsened with inspiration. Patient seen and evaluated at [facility]. Orders and note placed in chart.  Also see epic progress note.

## 2013-10-15 ENCOUNTER — Telehealth: Payer: Self-pay | Admitting: Family Medicine

## 2013-10-15 ENCOUNTER — Observation Stay (HOSPITAL_COMMUNITY): Payer: Medicare Other

## 2013-10-15 ENCOUNTER — Inpatient Hospital Stay (HOSPITAL_COMMUNITY)
Admission: EM | Admit: 2013-10-15 | Discharge: 2013-10-25 | DRG: 200 | Disposition: A | Discharge status: Skilled Nursing Facility | Payer: Medicare Other | Attending: General Surgery | Admitting: General Surgery

## 2013-10-15 ENCOUNTER — Emergency Department (HOSPITAL_COMMUNITY): Payer: Medicare Other

## 2013-10-15 ENCOUNTER — Encounter (HOSPITAL_COMMUNITY): Payer: Self-pay | Admitting: Emergency Medicine

## 2013-10-15 DIAGNOSIS — K589 Irritable bowel syndrome without diarrhea: Secondary | ICD-10-CM | POA: Diagnosis present

## 2013-10-15 DIAGNOSIS — S0993XA Unspecified injury of face, initial encounter: Secondary | ICD-10-CM | POA: Diagnosis not present

## 2013-10-15 DIAGNOSIS — F411 Generalized anxiety disorder: Secondary | ICD-10-CM | POA: Diagnosis present

## 2013-10-15 DIAGNOSIS — Z79899 Other long term (current) drug therapy: Secondary | ICD-10-CM | POA: Diagnosis not present

## 2013-10-15 DIAGNOSIS — Z789 Other specified health status: Secondary | ICD-10-CM

## 2013-10-15 DIAGNOSIS — E876 Hypokalemia: Secondary | ICD-10-CM | POA: Diagnosis present

## 2013-10-15 DIAGNOSIS — IMO0001 Reserved for inherently not codable concepts without codable children: Secondary | ICD-10-CM | POA: Diagnosis not present

## 2013-10-15 DIAGNOSIS — S32020A Wedge compression fracture of second lumbar vertebra, initial encounter for closed fracture: Secondary | ICD-10-CM

## 2013-10-15 DIAGNOSIS — I359 Nonrheumatic aortic valve disorder, unspecified: Secondary | ICD-10-CM | POA: Diagnosis not present

## 2013-10-15 DIAGNOSIS — N390 Urinary tract infection, site not specified: Secondary | ICD-10-CM | POA: Diagnosis not present

## 2013-10-15 DIAGNOSIS — S2241XA Multiple fractures of ribs, right side, initial encounter for closed fracture: Secondary | ICD-10-CM

## 2013-10-15 DIAGNOSIS — Z9181 History of falling: Secondary | ICD-10-CM

## 2013-10-15 DIAGNOSIS — F0390 Unspecified dementia without behavioral disturbance: Secondary | ICD-10-CM | POA: Diagnosis not present

## 2013-10-15 DIAGNOSIS — Y9210 Unspecified residential institution as the place of occurrence of the external cause: Secondary | ICD-10-CM | POA: Diagnosis present

## 2013-10-15 DIAGNOSIS — E785 Hyperlipidemia, unspecified: Secondary | ICD-10-CM | POA: Diagnosis present

## 2013-10-15 DIAGNOSIS — Z5189 Encounter for other specified aftercare: Secondary | ICD-10-CM | POA: Diagnosis not present

## 2013-10-15 DIAGNOSIS — Z8249 Family history of ischemic heart disease and other diseases of the circulatory system: Secondary | ICD-10-CM | POA: Diagnosis not present

## 2013-10-15 DIAGNOSIS — R488 Other symbolic dysfunctions: Secondary | ICD-10-CM | POA: Diagnosis not present

## 2013-10-15 DIAGNOSIS — S272XXA Traumatic hemopneumothorax, initial encounter: Secondary | ICD-10-CM

## 2013-10-15 DIAGNOSIS — E039 Hypothyroidism, unspecified: Secondary | ICD-10-CM | POA: Diagnosis not present

## 2013-10-15 DIAGNOSIS — K5900 Constipation, unspecified: Secondary | ICD-10-CM | POA: Diagnosis not present

## 2013-10-15 DIAGNOSIS — S2249XA Multiple fractures of ribs, unspecified side, initial encounter for closed fracture: Secondary | ICD-10-CM | POA: Diagnosis not present

## 2013-10-15 DIAGNOSIS — R079 Chest pain, unspecified: Secondary | ICD-10-CM | POA: Diagnosis not present

## 2013-10-15 DIAGNOSIS — I119 Hypertensive heart disease without heart failure: Secondary | ICD-10-CM | POA: Diagnosis present

## 2013-10-15 DIAGNOSIS — K219 Gastro-esophageal reflux disease without esophagitis: Secondary | ICD-10-CM | POA: Diagnosis present

## 2013-10-15 DIAGNOSIS — Z66 Do not resuscitate: Secondary | ICD-10-CM | POA: Diagnosis present

## 2013-10-15 DIAGNOSIS — R4182 Altered mental status, unspecified: Secondary | ICD-10-CM | POA: Diagnosis not present

## 2013-10-15 DIAGNOSIS — S270XXA Traumatic pneumothorax, initial encounter: Secondary | ICD-10-CM

## 2013-10-15 DIAGNOSIS — M6281 Muscle weakness (generalized): Secondary | ICD-10-CM | POA: Diagnosis not present

## 2013-10-15 DIAGNOSIS — J9383 Other pneumothorax: Secondary | ICD-10-CM | POA: Diagnosis not present

## 2013-10-15 DIAGNOSIS — S2239XA Fracture of one rib, unspecified side, initial encounter for closed fracture: Secondary | ICD-10-CM | POA: Diagnosis not present

## 2013-10-15 DIAGNOSIS — S32010A Wedge compression fracture of first lumbar vertebra, initial encounter for closed fracture: Secondary | ICD-10-CM

## 2013-10-15 DIAGNOSIS — S32009A Unspecified fracture of unspecified lumbar vertebra, initial encounter for closed fracture: Secondary | ICD-10-CM | POA: Diagnosis present

## 2013-10-15 DIAGNOSIS — R0789 Other chest pain: Secondary | ICD-10-CM | POA: Diagnosis not present

## 2013-10-15 DIAGNOSIS — R1110 Vomiting, unspecified: Secondary | ICD-10-CM | POA: Diagnosis not present

## 2013-10-15 DIAGNOSIS — R1310 Dysphagia, unspecified: Secondary | ICD-10-CM | POA: Diagnosis not present

## 2013-10-15 DIAGNOSIS — Z888 Allergy status to other drugs, medicaments and biological substances status: Secondary | ICD-10-CM | POA: Diagnosis not present

## 2013-10-15 DIAGNOSIS — S2242XA Multiple fractures of ribs, left side, initial encounter for closed fracture: Secondary | ICD-10-CM

## 2013-10-15 DIAGNOSIS — K560 Paralytic ileus: Secondary | ICD-10-CM | POA: Diagnosis not present

## 2013-10-15 DIAGNOSIS — D62 Acute posthemorrhagic anemia: Secondary | ICD-10-CM | POA: Diagnosis not present

## 2013-10-15 DIAGNOSIS — IMO0002 Reserved for concepts with insufficient information to code with codable children: Secondary | ICD-10-CM | POA: Diagnosis not present

## 2013-10-15 DIAGNOSIS — E7800 Pure hypercholesterolemia, unspecified: Secondary | ICD-10-CM | POA: Diagnosis present

## 2013-10-15 DIAGNOSIS — E871 Hypo-osmolality and hyponatremia: Secondary | ICD-10-CM | POA: Diagnosis not present

## 2013-10-15 DIAGNOSIS — W19XXXA Unspecified fall, initial encounter: Secondary | ICD-10-CM

## 2013-10-15 DIAGNOSIS — S2231XA Fracture of one rib, right side, initial encounter for closed fracture: Secondary | ICD-10-CM

## 2013-10-15 DIAGNOSIS — Z593 Problems related to living in residential institution: Secondary | ICD-10-CM | POA: Diagnosis not present

## 2013-10-15 DIAGNOSIS — S3981XA Other specified injuries of abdomen, initial encounter: Secondary | ICD-10-CM | POA: Diagnosis not present

## 2013-10-15 DIAGNOSIS — S0990XA Unspecified injury of head, initial encounter: Secondary | ICD-10-CM | POA: Diagnosis not present

## 2013-10-15 DIAGNOSIS — I350 Nonrheumatic aortic (valve) stenosis: Secondary | ICD-10-CM

## 2013-10-15 DIAGNOSIS — R269 Unspecified abnormalities of gait and mobility: Secondary | ICD-10-CM | POA: Diagnosis not present

## 2013-10-15 DIAGNOSIS — T1490XA Injury, unspecified, initial encounter: Secondary | ICD-10-CM | POA: Diagnosis not present

## 2013-10-15 LAB — URINALYSIS, ROUTINE W REFLEX MICROSCOPIC
Bilirubin Urine: NEGATIVE
Nitrite: POSITIVE — AB
Specific Gravity, Urine: 1.043 — ABNORMAL HIGH (ref 1.005–1.030)
Urobilinogen, UA: 0.2 mg/dL (ref 0.0–1.0)

## 2013-10-15 LAB — CBC WITH DIFFERENTIAL/PLATELET
Basophils Absolute: 0 10*3/uL (ref 0.0–0.1)
Basophils Relative: 0 % (ref 0–1)
Eosinophils Absolute: 0 10*3/uL (ref 0.0–0.7)
MCHC: 34.3 g/dL (ref 30.0–36.0)
MCV: 87 fL (ref 78.0–100.0)
Monocytes Absolute: 1.7 10*3/uL — ABNORMAL HIGH (ref 0.1–1.0)
Neutro Abs: 8.8 10*3/uL — ABNORMAL HIGH (ref 1.7–7.7)
Neutrophils Relative %: 72 % (ref 43–77)
Platelets: 253 10*3/uL (ref 150–400)
RDW: 12.7 % (ref 11.5–15.5)
WBC: 12.1 10*3/uL — ABNORMAL HIGH (ref 4.0–10.5)

## 2013-10-15 LAB — POCT I-STAT, CHEM 8
Calcium, Ion: 1.17 mmol/L (ref 1.13–1.30)
Glucose, Bld: 123 mg/dL — ABNORMAL HIGH (ref 70–99)
HCT: 37 % (ref 36.0–46.0)
Hemoglobin: 12.6 g/dL (ref 12.0–15.0)
Potassium: 3.5 mEq/L (ref 3.5–5.1)
Sodium: 133 mEq/L — ABNORMAL LOW (ref 135–145)

## 2013-10-15 LAB — URINE MICROSCOPIC-ADD ON

## 2013-10-15 LAB — MRSA PCR SCREENING: MRSA by PCR: NEGATIVE

## 2013-10-15 MED ORDER — PANTOPRAZOLE SODIUM 40 MG PO TBEC
40.0000 mg | DELAYED_RELEASE_TABLET | Freq: Every day | ORAL | Status: DC
  Administered 2013-10-15 – 2013-10-16 (×2): 40 mg via ORAL
  Filled 2013-10-15 (×2): qty 1

## 2013-10-15 MED ORDER — ACETAMINOPHEN ER 650 MG PO TBCR: 650.0000 mg | EXTENDED_RELEASE_TABLET | Freq: Three times a day (TID) | ORAL | Status: DC

## 2013-10-15 MED ORDER — ONDANSETRON HCL 4 MG PO TABS: 4.0000 mg | ORAL_TABLET | Freq: Four times a day (QID) | ORAL | Status: DC | PRN

## 2013-10-15 MED ORDER — DIGOXIN 125 MCG PO TABS
125.0000 ug | ORAL_TABLET | Freq: Every day | ORAL | Status: DC
  Administered 2013-10-16 – 2013-10-25 (×9): 125 ug via ORAL
  Filled 2013-10-15 (×11): qty 1

## 2013-10-15 MED ORDER — ONDANSETRON HCL 4 MG/2ML IJ SOLN
4.0000 mg | Freq: Once | INTRAMUSCULAR | Status: AC
  Administered 2013-10-15: 4 mg via INTRAVENOUS
  Filled 2013-10-15: qty 2

## 2013-10-15 MED ORDER — HYDROMORPHONE HCL PF 1 MG/ML IJ SOLN
1.0000 mg | INTRAMUSCULAR | Status: AC
  Administered 2013-10-15: 1 mg via INTRAVENOUS
  Filled 2013-10-15: qty 1

## 2013-10-15 MED ORDER — HYDROMORPHONE HCL PF 1 MG/ML IJ SOLN
0.5000 mg | Freq: Once | INTRAMUSCULAR | Status: AC
  Administered 2013-10-15: 0.5 mg via INTRAVENOUS
  Filled 2013-10-15: qty 1

## 2013-10-15 MED ORDER — ACETAMINOPHEN 325 MG PO TABS
650.0000 mg | ORAL_TABLET | Freq: Three times a day (TID) | ORAL | Status: DC
  Administered 2013-10-15 – 2013-10-25 (×27): 650 mg via ORAL
  Filled 2013-10-15 (×26): qty 2

## 2013-10-15 MED ORDER — LEVOTHYROXINE SODIUM 125 MCG PO TABS
125.0000 ug | ORAL_TABLET | Freq: Every day | ORAL | Status: DC
  Administered 2013-10-16 – 2013-10-23 (×8): 125 ug via ORAL
  Filled 2013-10-15 (×10): qty 1

## 2013-10-15 MED ORDER — KCL IN DEXTROSE-NACL 20-5-0.9 MEQ/L-%-% IV SOLN
INTRAVENOUS | Status: DC
  Administered 2013-10-15: 15:00:00 via INTRAVENOUS
  Filled 2013-10-15 (×3): qty 1000

## 2013-10-15 MED ORDER — SIMVASTATIN 10 MG PO TABS
10.0000 mg | ORAL_TABLET | Freq: Every day | ORAL | Status: DC
  Administered 2013-10-15 – 2013-10-24 (×10): 10 mg via ORAL
  Filled 2013-10-15 (×14): qty 1

## 2013-10-15 MED ORDER — MORPHINE SULFATE 2 MG/ML IJ SOLN
2.0000 mg | INTRAMUSCULAR | Status: DC | PRN
  Administered 2013-10-15 – 2013-10-17 (×11): 2 mg via INTRAVENOUS
  Filled 2013-10-15 (×11): qty 1

## 2013-10-15 MED ORDER — AMLODIPINE BESYLATE 5 MG PO TABS
5.0000 mg | ORAL_TABLET | Freq: Every day | ORAL | Status: DC
  Administered 2013-10-15 – 2013-10-24 (×10): 5 mg via ORAL
  Filled 2013-10-15 (×12): qty 1

## 2013-10-15 MED ORDER — HYDROCODONE-ACETAMINOPHEN 5-325 MG PO TABS
1.0000 | ORAL_TABLET | Freq: Four times a day (QID) | ORAL | Status: DC | PRN
  Administered 2013-10-16 (×2): 1 via ORAL
  Filled 2013-10-15 (×2): qty 1

## 2013-10-15 MED ORDER — DEXTROSE 5 % IV SOLN
1.0000 g | Freq: Once | INTRAVENOUS | Status: AC
  Administered 2013-10-15: 1 g via INTRAVENOUS
  Filled 2013-10-15: qty 10

## 2013-10-15 MED ORDER — BALSALAZIDE DISODIUM 750 MG PO CAPS
1500.0000 mg | ORAL_CAPSULE | Freq: Three times a day (TID) | ORAL | Status: DC
  Administered 2013-10-18 – 2013-10-25 (×22): 1500 mg via ORAL
  Filled 2013-10-15 (×28): qty 2

## 2013-10-15 MED ORDER — PANTOPRAZOLE SODIUM 40 MG IV SOLR
40.0000 mg | Freq: Every day | INTRAVENOUS | Status: DC
  Filled 2013-10-15 (×2): qty 40

## 2013-10-15 MED ORDER — IOHEXOL 300 MG/ML  SOLN
100.0000 mL | Freq: Once | INTRAMUSCULAR | Status: AC | PRN
  Administered 2013-10-15: 100 mL via INTRAVENOUS

## 2013-10-15 MED ORDER — ONDANSETRON HCL 4 MG/2ML IJ SOLN
4.0000 mg | Freq: Four times a day (QID) | INTRAMUSCULAR | Status: DC | PRN
  Administered 2013-10-20 – 2013-10-22 (×3): 4 mg via INTRAVENOUS
  Filled 2013-10-15 (×3): qty 2

## 2013-10-15 MED ORDER — HYDROCHLOROTHIAZIDE 25 MG PO TABS
25.0000 mg | ORAL_TABLET | Freq: Every day | ORAL | Status: DC
  Administered 2013-10-15 – 2013-10-22 (×8): 25 mg via ORAL
  Filled 2013-10-15 (×9): qty 1

## 2013-10-15 MED ORDER — BISACODYL 10 MG RE SUPP: 10.0000 mg | Freq: Every day | RECTAL | Status: DC | PRN

## 2013-10-15 NOTE — Progress Notes (Signed)
Clinical Social Work Department BRIEF PSYCHOSOCIAL ASSESSMENT 10/15/2013  Patient:  Savannah Todd, Savannah Todd     Account Number:  [id number]     Admit date:  10/15/2013  Clinical Social Worker:  Hadley Pen  Date/Time:  10/15/2013 04:45 PM  Referred by:    Date Referred:   Referred for  SNF Placement   Other Referral:   No official referral, patient admitted from [facility] to ED   Interview type:  Family Other interview type:   Weekend CSW spoke with son Anikah Hogge [phone] via telephone.    PSYCHOSOCIAL DATA Living Status:  FACILITY Admitted from facility:  [facility] Level of care:  Skilled Nursing Facility Primary support name:  Aretta Stetzel [phone] & Kyrie Bun [phone] Primary support relationship to patient:  CHILD, ADULT Degree of support available:   Fair    CURRENT CONCERNS Current Concerns  Post-Acute Placement   Other Concerns:    SOCIAL WORK ASSESSMENT / PLAN Patient presented to ED from [facility] with complaints of chest pains after falling previous day. Weekend CSW spoke with patient in ED regarding her fall, patient was confused and not able to recollect incident. Once admitted to inpatient, weekend CSW contacted son Lottie Sigman to assess if patient will be discharged back to [facility] when medically stable. Patient's son Marlinda Mike stated that he felt comfortable with the care patient is receiving from [facility] and would like for her to return upon discharge. Weekend CSW encouraged patient's son to request CSW assistance from nursing staff if he has further questions or concerns. Weekday CSW to follow for discharge planning.   Assessment/plan status:   Other assessment/ plan:   Information/referral to community resources:    PATIENT'S/FAMILY'S RESPONSE TO PLAN OF CARE: Patient was confused; Son Destenie Ingber is agreeable at this time to patient returning to [place] upon discharge.     Samuella Bruin, MSW,  LCSWA Clinical Social Worker [facility] Emergency Dept. [phone]

## 2013-10-15 NOTE — ED Notes (Signed)
Patient here from [facility].  Patient fell x2 yesterday, had xray which reported negative results.  Patient with increased pain on bilat rib cages, EMS noted crepitis on right side of flank.  Alert to baseline, dementia.

## 2013-10-15 NOTE — ED Provider Notes (Signed)
CSN: [id number]     Arrival date & time 10/15/13  7829 History   First MD Initiated Contact with Patient 10/15/13 [phone]     Chief Complaint  Patient presents with  . Fall   (Consider location/radiation/quality/duration/timing/severity/associated sxs/prior Treatment) HPI Comments: Level 5 caveat due to dementia - the patient reports that she fell yesterday while at her son's home, she reports that she was trying to get from the bathroom back to the bed when she tripped.  According to nursing home staff, the patient fell twice with pain to the right lateral rib area.  She presents with bruising to left flank/LUQ which she reports is "old".  She complains of pain to bilateral lateral rib areas with crepitus noted to right lateral ribs.  She denies striking her head and there is no report of head injury from nursing home.    Patient is a 79 y.o. female presenting with fall. The history is provided by the patient, the nursing home and the EMS personnel. The history is limited by the absence of a caregiver. No language interpreter was used.  Fall This is a new problem. The current episode started yesterday. The problem occurs 2 to 4 times per day. The problem has been unchanged. Associated symptoms include arthralgias and chest pain. Pertinent negatives include no abdominal pain, congestion, coughing, fever, headaches, myalgias, nausea, neck pain, numbness, sore throat, urinary symptoms, visual change, vomiting or weakness. The symptoms are aggravated by twisting. She has tried nothing for the symptoms. The treatment provided no relief.    Past Medical History  Diagnosis Date  . Thyroid disease     hypothyroidism  . Hypertension   . IBS (irritable bowel syndrome)   . Aortic stenosis   . Memory disorder     mild  . Hyperlipidemia   . GERD (gastroesophageal reflux disease)   . Ulcerative colitis   . Arthritis   . Anxiety    Past Surgical History  Procedure Laterality Date  .  Esophagogastroduodenoscopy  01/30/1993    with biopsy/second duodenum normal/mucosa normal/no ulcerations or any inflammatory activity/no evidence of extrinsic compression or active inflammation/fundus & cardia normal/2 to 3cm hiatal hernia/proximal esophagus normal  . Esophagogastroduodenoscopy  07/18/1991    ampulla normal/pyloric channel,prepyloric area, antrum & body normal/fundus & cardia normal/distal & proximal esophagus normal/  . Abdominal hysterectomy     Family History  Problem Relation Age of Onset  . Heart disease    . Cancer Mother   . Heart disease Father    History  Substance Use Topics  . Smoking status: Never Smoker   . Smokeless tobacco: Not on file  . Alcohol Use: No   OB History   Grav Para Term Preterm Abortions TAB SAB Ect Mult Living                 Review of Systems  Unable to perform ROS: Dementia  Constitutional: Negative for fever.  HENT: Negative for congestion and sore throat.   Respiratory: Negative for cough.   Cardiovascular: Positive for chest pain.  Gastrointestinal: Negative for nausea, vomiting and abdominal pain.  Musculoskeletal: Positive for arthralgias. Negative for myalgias and neck pain.  Neurological: Negative for weakness, numbness and headaches.    Allergies  Niaspan; Bactrim; Benazepril hcl; and Naprosyn  Home Medications   Current Outpatient Rx  Name  Route  Sig  Dispense  Refill  . acetaminophen (TYLENOL) 650 MG CR tablet   Oral   Take 650 mg by mouth every 8 (  eight) hours.         Marland Kitchen amLODipine (NORVASC) 5 MG tablet   Oral   Take 5 mg by mouth daily.         . balsalazide (COLAZAL) 750 MG capsule   Oral   Take 2 capsules (1,500 mg total) by mouth 3 (three) times daily. 3 tablets TID         . BREWERS YEAST PO   Oral   Take 2 tablets by mouth 3 (three) times daily.           . Calcium Carbonate Antacid (TUMS PO)   Oral   Take 1 tablet by mouth as needed (indigestion).          . Calcium-Vitamin  D-Vitamin K (VIACTIV PO)   Oral   Take 1 tablet by mouth daily.          . cholecalciferol (VITAMIN D) 400 UNITS TABS tablet   Oral   Take 400 Units by mouth daily.         . digoxin (LANOXIN) 0.125 MG tablet   Oral   Take 1 tablet (125 mcg total) by mouth daily.   30 tablet   5   . diphenhydrAMINE (BENADRYL) 25 mg capsule   Oral   Take 25 mg by mouth every 6 (six) hours as needed for itching.         Marland Kitchen HYDROcodone-acetaminophen (NORCO/VICODIN) 5-325 MG per tablet   Oral   Take 1 tablet by mouth every 6 (six) hours as needed for pain.         Marland Kitchen levothyroxine (SYNTHROID, LEVOTHROID) 125 MCG tablet   Oral   Take 125 mcg by mouth daily.         . Multiple Vitamin (MULTIVITAMIN PO)   Oral   Take 1 tablet by mouth daily.           . simvastatin (ZOCOR) 10 MG tablet   Oral   Take 10 mg by mouth at bedtime.         Marland Kitchen acetaminophen (TYLENOL) 500 MG tablet   Oral   Take 500 mg by mouth every 6 (six) hours as needed for pain (headacheun).          . bisacodyl (DULCOLAX) 10 MG suppository   Rectal   Place 10 mg rectally daily as needed for constipation (if constipation not relieved by milk of magnesia).         . hydrochlorothiazide (HYDRODIURIL) 25 MG tablet      1/2 tablet on Mon, Wed, and Fri only   30 tablet   11   . magnesium hydroxide (MILK OF MAGNESIA) 400 MG/5ML suspension   Oral   Take 30 mLs by mouth daily as needed for constipation.         . sodium phosphate (FLEET) 7-19 GM/118ML ENEM   Rectal   Place 1 enema rectally daily as needed (for constipation not relieved by milk of magnesia and bisacodyl).          BP 172/59  Pulse 54  Temp(Src) 98.5 F (36.9 C) (Oral)  Resp 17  SpO2 97% Physical Exam  Nursing note and vitals reviewed. Constitutional: She appears well-developed and well-nourished.  HENT:  Head: Normocephalic and atraumatic.  Right Ear: External ear normal.  Left Ear: External ear normal.  Nose: Nose normal.    Mouth/Throat: Oropharynx is clear and moist. No oropharyngeal exudate.  Eyes: Conjunctivae are normal. Pupils are equal, round, and reactive to light. No  scleral icterus.  Neck: Normal range of motion. Neck supple. No spinous process tenderness and no muscular tenderness present.  Cardiovascular: Normal rate, regular rhythm and normal heart sounds.  Exam reveals no gallop and no friction rub.   No murmur heard. Pulmonary/Chest: Effort normal and breath sounds normal. No respiratory distress. She has no wheezes. She has no rales. She exhibits tenderness and crepitus.    Crepitus noted and palpable fracture noted to right posterior lateral ribs  Abdominal: Soft. Bowel sounds are normal. She exhibits no distension. There is no tenderness. There is no rebound and no guarding.  Large 10cm area of eccymosis to LUQ  Musculoskeletal: Normal range of motion. She exhibits no edema and no tenderness.  Neurological: She is alert. No cranial nerve deficit. She exhibits normal muscle tone. Coordination normal.  Skin: Skin is warm and dry. No rash noted. No erythema. No pallor.  Psychiatric: She has a normal mood and affect. Her behavior is normal. Judgment and thought content normal.    ED Course  Procedures (including critical care time) Labs Review Labs Reviewed  CBC WITH DIFFERENTIAL - Abnormal; Notable for the following:    WBC 12.1 (*)    HCT 35.6 (*)    Neutro Abs 8.8 (*)    Monocytes Relative 14 (*)    Monocytes Absolute 1.7 (*)    All other components within normal limits  POCT I-STAT, CHEM 8 - Abnormal; Notable for the following:    Sodium 133 (*)    Chloride 95 (*)    Glucose, Bld 123 (*)    All other components within normal limits  URINALYSIS, ROUTINE W REFLEX MICROSCOPIC  CG4 I-STAT (LACTIC ACID)   Imaging Review No results found.  EKG Interpretation   None      Results for orders placed during the hospital encounter of 10/15/13  CBC WITH DIFFERENTIAL      Result Value  Range   WBC 12.1 (*) 4.0 - 10.5 K/uL   RBC 4.09  3.87 - 5.11 MIL/uL   Hemoglobin 12.2  12.0 - 15.0 g/dL   HCT 16.1 (*) 09.6 - 04.5 %   MCV 87.0  78.0 - 100.0 fL   MCH 29.8  26.0 - 34.0 pg   MCHC 34.3  30.0 - 36.0 g/dL   RDW 40.9  81.1 - 91.4 %   Platelets 253  150 - 400 K/uL   Neutrophils Relative % 72  43 - 77 %   Neutro Abs 8.8 (*) 1.7 - 7.7 K/uL   Lymphocytes Relative 14  12 - 46 %   Lymphs Abs 1.7  0.7 - 4.0 K/uL   Monocytes Relative 14 (*) 3 - 12 %   Monocytes Absolute 1.7 (*) 0.1 - 1.0 K/uL   Eosinophils Relative 0  0 - 5 %   Eosinophils Absolute 0.0  0.0 - 0.7 K/uL   Basophils Relative 0  0 - 1 %   Basophils Absolute 0.0  0.0 - 0.1 K/uL  POCT I-STAT, CHEM 8      Result Value Range   Sodium 133 (*) 135 - 145 mEq/L   Potassium 3.5  3.5 - 5.1 mEq/L   Chloride 95 (*) 96 - 112 mEq/L   BUN 16  6 - 23 mg/dL   Creatinine, Ser 7.82  0.50 - 1.10 mg/dL   Glucose, Bld 956 (*) 70 - 99 mg/dL   Calcium, Ion 2.13  0.86 - 1.30 mmol/L   TCO2 28  0 - 100 mmol/L  Hemoglobin 12.6  12.0 - 15.0 g/dL   HCT 16.1  09.6 - 04.5 %  CG4 I-STAT (LACTIC ACID)      Result Value Range   Lactic Acid, Venous 1.32  0.5 - 2.2 mmol/L   Ct Head Wo Contrast  10/15/2013   CLINICAL DATA:  Patient fell twice yesterday  EXAM: CT HEAD WITHOUT CONTRAST  CT CERVICAL SPINE WITHOUT CONTRAST  TECHNIQUE: Multidetector CT imaging of the head and cervical spine was performed following the standard protocol without intravenous contrast. Multiplanar CT image reconstructions of the cervical spine were also generated.  COMPARISON:  09/14/2013  FINDINGS: CT HEAD FINDINGS  Anticipated age-related atrophy and low attenuation in the white matter. No hemorrhage, extra-axial fluid, infarct, mass, or skull fracture.  CT CERVICAL SPINE FINDINGS  There is a small pneumothorax on the right. There is a small volume of subcutaneous emphysema dorsally in the right paraspinous soft tissues. There is no cervical spine fracture. There is  normal anterior-posterior alignment. There is no prevertebral soft tissue swelling. There is multilevel degenerative disc disease.  IMPRESSION: No acute intracranial abnormality. No evidence of cervical spine fracture. Right pneumothorax and right thoracic subcutaneous emphysema.See report of CT thorax for full details.   Electronically Signed   By: Esperanza Heir M.D.   On: 10/15/2013 08:03   Ct Chest W Contrast  10/15/2013   CLINICAL DATA:  Multiple falls yesterday, bilateral rib pain, crepitus on right side PHYSICAL EXAMINATION  EXAM: CT CHEST, ABDOMEN, AND PELVIS WITH CONTRAST  TECHNIQUE: Multidetector CT imaging of the chest, abdomen and pelvis was performed following the standard protocol during bolus administration of intravenous contrast.  CONTRAST:  OMNIPAQUE IOHEXOL 300 MG/ML  SOLN  COMPARISON:  None.  FINDINGS: CT CHEST FINDINGS  Coronary and aortic calcification noted. No acute abnormalities involving heart or great vessels. Small bilateral pleural effusions. Bilateral mild dependent atelectasis. There is a small to moderate right pneumothorax anteriorly measuring up to 18 mm. No left pneumothorax is identified. On the left, there is a mildly displaced fracture of the posterior 12th rib. There is a moderately displaced fracture of the posterior 11th rib. On the right, there is a mildly displaced fracture of the posterior right 9th rib. There is a severely displaced fracture of the posterior lateral 8th rib. There is a severely displaced fracture of the lateral 7th rib. There is a moderately displaced fracture of the lateral 6th rib. In particular, the proximal fragment of the right 7th rib projects about 1 cm in 2 right lung parenchyma with significant surrounding hematoma, hemothorax, and subcutaneous emphysema.  CT ABDOMEN AND PELVIS FINDINGS  Liver and gallbladder are normal. Spleen shows no acute findings. There is an incidental subcentimeter splenic calcification. Pancreas is normal.  Adrenal glands are normal. There are a few small renal cysts. There is calcification of the aorta. Bowel is normal. No free air or fluid in the abdomen or pelvis. There is diverticulosis of the sigmoid colon. Bladder is normal. Reproductive organs appear absent. Inferior aspect of the pubic rami are excluded from the field of view. There is a significant compression deformity of the L2 superior endplate. L1 vertebral body shows mild to moderate compression deformity. No retropulsion identified. Fracture line is visible along the superior aspect of L1 vertebral body.  IMPRESSION: 1. Bilateral displaced rib fractures right more numerous and more severely displaced than left. 2. Right pneumothorax. Subcutaneous emphysema. Small bilateral hemo thoraces. 3. L2 superior endplate fracture age indeterminate. L1 compression deformity, age uncertain.  Fracture line is visible suggesting acute to subacute fracture. Critical Value/emergent results were called by telephone at the time of interpretation on 10/15/2013 at 8:30 AM to Dr.Karel Mowers [facility] , who verbally acknowledged these results.   Electronically Signed   By: Esperanza Heir M.D.   On: 10/15/2013 08:31   Ct Cervical Spine Wo Contrast  10/15/2013   CLINICAL DATA:  Patient fell twice yesterday  EXAM: CT HEAD WITHOUT CONTRAST  CT CERVICAL SPINE WITHOUT CONTRAST  TECHNIQUE: Multidetector CT imaging of the head and cervical spine was performed following the standard protocol without intravenous contrast. Multiplanar CT image reconstructions of the cervical spine were also generated.  COMPARISON:  09/14/2013  FINDINGS: CT HEAD FINDINGS  Anticipated age-related atrophy and low attenuation in the white matter. No hemorrhage, extra-axial fluid, infarct, mass, or skull fracture.  CT CERVICAL SPINE FINDINGS  There is a small pneumothorax on the right. There is a small volume of subcutaneous emphysema dorsally in the right paraspinous soft tissues. There is no cervical spine  fracture. There is normal anterior-posterior alignment. There is no prevertebral soft tissue swelling. There is multilevel degenerative disc disease.  IMPRESSION: No acute intracranial abnormality. No evidence of cervical spine fracture. Right pneumothorax and right thoracic subcutaneous emphysema.See report of CT thorax for full details.   Electronically Signed   By: Esperanza Heir M.D.   On: 10/15/2013 08:03   Ct Abdomen Pelvis W Contrast  10/15/2013   CLINICAL DATA:  Multiple falls yesterday, bilateral rib pain, crepitus on right side PHYSICAL EXAMINATION  EXAM: CT CHEST, ABDOMEN, AND PELVIS WITH CONTRAST  TECHNIQUE: Multidetector CT imaging of the chest, abdomen and pelvis was performed following the standard protocol during bolus administration of intravenous contrast.  CONTRAST:  OMNIPAQUE IOHEXOL 300 MG/ML  SOLN  COMPARISON:  None.  FINDINGS: CT CHEST FINDINGS  Coronary and aortic calcification noted. No acute abnormalities involving heart or great vessels. Small bilateral pleural effusions. Bilateral mild dependent atelectasis. There is a small to moderate right pneumothorax anteriorly measuring up to 18 mm. No left pneumothorax is identified. On the left, there is a mildly displaced fracture of the posterior 12th rib. There is a moderately displaced fracture of the posterior 11th rib. On the right, there is a mildly displaced fracture of the posterior right 9th rib. There is a severely displaced fracture of the posterior lateral 8th rib. There is a severely displaced fracture of the lateral 7th rib. There is a moderately displaced fracture of the lateral 6th rib. In particular, the proximal fragment of the right 7th rib projects about 1 cm in 2 right lung parenchyma with significant surrounding hematoma, hemothorax, and subcutaneous emphysema.  CT ABDOMEN AND PELVIS FINDINGS  Liver and gallbladder are normal. Spleen shows no acute findings. There is an incidental subcentimeter splenic  calcification. Pancreas is normal. Adrenal glands are normal. There are a few small renal cysts. There is calcification of the aorta. Bowel is normal. No free air or fluid in the abdomen or pelvis. There is diverticulosis of the sigmoid colon. Bladder is normal. Reproductive organs appear absent. Inferior aspect of the pubic rami are excluded from the field of view. There is a significant compression deformity of the L2 superior endplate. L1 vertebral body shows mild to moderate compression deformity. No retropulsion identified. Fracture line is visible along the superior aspect of L1 vertebral body.  IMPRESSION: 1. Bilateral displaced rib fractures right more numerous and more severely displaced than left. 2. Right pneumothorax. Subcutaneous emphysema. Small bilateral hemo  thoraces. 3. L2 superior endplate fracture age indeterminate. L1 compression deformity, age uncertain. Fracture line is visible suggesting acute to subacute fracture. Critical Value/emergent results were called by telephone at the time of interpretation on 10/15/2013 at 8:30 AM to Dr.Russie Gulledge [facility] , who verbally acknowledged these results.   Electronically Signed   By: Esperanza Heir M.D.   On: 10/15/2013 08:31    8:44 AM Spoke with Dr. Carolynne Edouard with trauma surgery, he will be down to see the patient, she is noted with bilateral displaced rib fracures worse on the right of the 6th - 9th ribs with severe to moderate displacement.  Also noted with displaced rib fractures on the left 10th and 11th rib, there is noted small right pneumothorax with subcutaneous emphysema, also noted with L1 compression fracture which is likely new.  9:27 AM Urine back, patient with UTI, culture sent, rocephin 1gm IV given.  CRITICAL CARE Performed by: Patrecia Pour. Total critical care time: 60 Critical care time was exclusive of separately billable procedures and treating other patients. Critical care was necessary to treat or prevent imminent or  life-threatening deterioration. Critical care was time spent personally by me on the following activities: development of treatment plan with patient and/or surrogate as well as nursing, discussions with consultants, evaluation of patient's response to treatment, examination of patient, obtaining history from patient or surrogate, ordering and performing treatments and interventions, ordering and review of laboratory studies, ordering and review of radiographic studies, pulse oximetry and re-evaluation of patient's condition.  MDM  Rib fractures right side 6-9 Rib fractures left side 10-11 L1-2 Compression fracture Small pneumothorax   Patient with history of frequent falls presents after mechanical fall yesterday.  Reportedly had x-ray yesterday with negative results, patient continued to complain of severe pain, noted on exam with crepitus. Noted with eccymosis to left flank/LUQ.  Multiple rib fractures noted on both sides right more severe than left.  Small hemopneumo, patient is stable on 2 liters of oxygen with sats of 97%.  Pain control adequate at this time.  Plan to have trauma surgery admit the patient.  Noted with also UTI and will treat this as well.  Critical care billed because of the complexity of this patient, the potential for decompensation, pain control, consult and admission per trauma.      Izola Price Marisue Humble, PA-C 10/15/13 1046

## 2013-10-15 NOTE — ED Notes (Signed)
Pt to CT, no changes, alert, awake, NAD, calm, tracking.

## 2013-10-15 NOTE — H&P (Signed)
Savannah Todd is an 79 y.o. female.   Chief Complaint: Fall HPI: The pt is a 79yo wf who is a resident of [facility]. She reportedly fell 2 times yesterday. At least one time she landed on her right side. She complained of some right sided chest pain. She was brought to the ER where CT today shows a small right sided pneumothorax and right rib fractures. She denies shortness of breath. No loc  Past Medical History  Diagnosis Date  . Thyroid disease     hypothyroidism  . Hypertension   . IBS (irritable bowel syndrome)   . Aortic stenosis   . Memory disorder     mild  . Hyperlipidemia   . GERD (gastroesophageal reflux disease)   . Ulcerative colitis   . Arthritis   . Anxiety     Past Surgical History  Procedure Laterality Date  . Esophagogastroduodenoscopy  01/30/1993    with biopsy/second duodenum normal/mucosa normal/no ulcerations or any inflammatory activity/no evidence of extrinsic compression or active inflammation/fundus & cardia normal/2 to 3cm hiatal hernia/proximal esophagus normal  . Esophagogastroduodenoscopy  07/18/1991    ampulla normal/pyloric channel,prepyloric area, antrum & body normal/fundus & cardia normal/distal & proximal esophagus normal/  . Abdominal hysterectomy      Family History  Problem Relation Age of Onset  . Heart disease    . Cancer Mother   . Heart disease Father    Social History:  reports that she has never smoked. She does not have any smokeless tobacco history on file. She reports that she does not drink alcohol or use illicit drugs.  Allergies:  Allergies  Allergen Reactions  . Niaspan [Niacin Er] Itching  . Bactrim Nausea Only  . Benazepril Hcl Cough  . Naprosyn [Naproxen] Itching     (Not in a hospital admission)  Results for orders placed during the hospital encounter of 10/15/13 (from the past 48 hour(s))  CBC WITH DIFFERENTIAL     Status: Abnormal   Collection Time    10/15/13  6:34 AM      Result Value Range    WBC 12.1 (*) 4.0 - 10.5 K/uL   RBC 4.09  3.87 - 5.11 MIL/uL   Hemoglobin 12.2  12.0 - 15.0 g/dL   HCT 16.1 (*) 09.6 - 04.5 %   MCV 87.0  78.0 - 100.0 fL   MCH 29.8  26.0 - 34.0 pg   MCHC 34.3  30.0 - 36.0 g/dL   RDW 40.9  81.1 - 91.4 %   Platelets 253  150 - 400 K/uL   Neutrophils Relative % 72  43 - 77 %   Neutro Abs 8.8 (*) 1.7 - 7.7 K/uL   Lymphocytes Relative 14  12 - 46 %   Lymphs Abs 1.7  0.7 - 4.0 K/uL   Monocytes Relative 14 (*) 3 - 12 %   Monocytes Absolute 1.7 (*) 0.1 - 1.0 K/uL   Eosinophils Relative 0  0 - 5 %   Eosinophils Absolute 0.0  0.0 - 0.7 K/uL   Basophils Relative 0  0 - 1 %   Basophils Absolute 0.0  0.0 - 0.1 K/uL  POCT I-STAT, CHEM 8     Status: Abnormal   Collection Time    10/15/13  7:04 AM      Result Value Range   Sodium 133 (*) 135 - 145 mEq/L   Potassium 3.5  3.5 - 5.1 mEq/L   Chloride 95 (*) 96 - 112 mEq/L  BUN 16  6 - 23 mg/dL   Creatinine, Ser 1.61  0.50 - 1.10 mg/dL   Glucose, Bld 096 (*) 70 - 99 mg/dL   Calcium, Ion 0.45  4.09 - 1.30 mmol/L   TCO2 28  0 - 100 mmol/L   Hemoglobin 12.6  12.0 - 15.0 g/dL   HCT 81.1  91.4 - 78.2 %  CG4 I-STAT (LACTIC ACID)     Status: None   Collection Time    10/15/13  7:05 AM      Result Value Range   Lactic Acid, Venous 1.32  0.5 - 2.2 mmol/L  URINALYSIS, ROUTINE W REFLEX MICROSCOPIC     Status: Abnormal   Collection Time    10/15/13  8:22 AM      Result Value Range   Color, Urine YELLOW  YELLOW   APPearance CLOUDY (*) CLEAR   Specific Gravity, Urine 1.043 (*) 1.005 - 1.030   pH 7.0  5.0 - 8.0   Glucose, UA NEGATIVE  NEGATIVE mg/dL   Hgb urine dipstick TRACE (*) NEGATIVE   Bilirubin Urine NEGATIVE  NEGATIVE   Ketones, ur NEGATIVE  NEGATIVE mg/dL   Protein, ur 956 (*) NEGATIVE mg/dL   Urobilinogen, UA 0.2  0.0 - 1.0 mg/dL   Nitrite POSITIVE (*) NEGATIVE   Leukocytes, UA MODERATE (*) NEGATIVE  URINE MICROSCOPIC-ADD ON     Status: Abnormal   Collection Time    10/15/13  8:22 AM      Result  Value Range   Squamous Epithelial / LPF RARE  RARE   WBC, UA 21-50  <3 WBC/hpf   RBC / HPF 0-2  <3 RBC/hpf   Bacteria, UA MANY (*) RARE   Ct Head Wo Contrast  10/15/2013   CLINICAL DATA:  Patient fell twice yesterday  EXAM: CT HEAD WITHOUT CONTRAST  CT CERVICAL SPINE WITHOUT CONTRAST  TECHNIQUE: Multidetector CT imaging of the head and cervical spine was performed following the standard protocol without intravenous contrast. Multiplanar CT image reconstructions of the cervical spine were also generated.  COMPARISON:  09/14/2013  FINDINGS: CT HEAD FINDINGS  Anticipated age-related atrophy and low attenuation in the white matter. No hemorrhage, extra-axial fluid, infarct, mass, or skull fracture.  CT CERVICAL SPINE FINDINGS  There is a small pneumothorax on the right. There is a small volume of subcutaneous emphysema dorsally in the right paraspinous soft tissues. There is no cervical spine fracture. There is normal anterior-posterior alignment. There is no prevertebral soft tissue swelling. There is multilevel degenerative disc disease.  IMPRESSION: No acute intracranial abnormality. No evidence of cervical spine fracture. Right pneumothorax and right thoracic subcutaneous emphysema.See report of CT thorax for full details.   Electronically Signed   By: Esperanza Heir M.D.   On: 10/15/2013 08:03   Ct Chest W Contrast  10/15/2013   CLINICAL DATA:  Multiple falls yesterday, bilateral rib pain, crepitus on right side PHYSICAL EXAMINATION  EXAM: CT CHEST, ABDOMEN, AND PELVIS WITH CONTRAST  TECHNIQUE: Multidetector CT imaging of the chest, abdomen and pelvis was performed following the standard protocol during bolus administration of intravenous contrast.  CONTRAST:  OMNIPAQUE IOHEXOL 300 MG/ML  SOLN  COMPARISON:  None.  FINDINGS: CT CHEST FINDINGS  Coronary and aortic calcification noted. No acute abnormalities involving heart or great vessels. Small bilateral pleural effusions. Bilateral mild  dependent atelectasis. There is a small to moderate right pneumothorax anteriorly measuring up to 18 mm. No left pneumothorax is identified. On the left, there is a  mildly displaced fracture of the posterior 12th rib. There is a moderately displaced fracture of the posterior 11th rib. On the right, there is a mildly displaced fracture of the posterior right 9th rib. There is a severely displaced fracture of the posterior lateral 8th rib. There is a severely displaced fracture of the lateral 7th rib. There is a moderately displaced fracture of the lateral 6th rib. In particular, the proximal fragment of the right 7th rib projects about 1 cm in 2 right lung parenchyma with significant surrounding hematoma, hemothorax, and subcutaneous emphysema.  CT ABDOMEN AND PELVIS FINDINGS  Liver and gallbladder are normal. Spleen shows no acute findings. There is an incidental subcentimeter splenic calcification. Pancreas is normal. Adrenal glands are normal. There are a few small renal cysts. There is calcification of the aorta. Bowel is normal. No free air or fluid in the abdomen or pelvis. There is diverticulosis of the sigmoid colon. Bladder is normal. Reproductive organs appear absent. Inferior aspect of the pubic rami are excluded from the field of view. There is a significant compression deformity of the L2 superior endplate. L1 vertebral body shows mild to moderate compression deformity. No retropulsion identified. Fracture line is visible along the superior aspect of L1 vertebral body.  IMPRESSION: 1. Bilateral displaced rib fractures right more numerous and more severely displaced than left. 2. Right pneumothorax. Subcutaneous emphysema. Small bilateral hemo thoraces. 3. L2 superior endplate fracture age indeterminate. L1 compression deformity, age uncertain. Fracture line is visible suggesting acute to subacute fracture. Critical Value/emergent results were called by telephone at the time of interpretation on  10/15/2013 at 8:30 AM to Dr.FRANCES [facility] , who verbally acknowledged these results.   Electronically Signed   By: Esperanza Heir M.D.   On: 10/15/2013 08:31   Ct Cervical Spine Wo Contrast  10/15/2013   CLINICAL DATA:  Patient fell twice yesterday  EXAM: CT HEAD WITHOUT CONTRAST  CT CERVICAL SPINE WITHOUT CONTRAST  TECHNIQUE: Multidetector CT imaging of the head and cervical spine was performed following the standard protocol without intravenous contrast. Multiplanar CT image reconstructions of the cervical spine were also generated.  COMPARISON:  09/14/2013  FINDINGS: CT HEAD FINDINGS  Anticipated age-related atrophy and low attenuation in the white matter. No hemorrhage, extra-axial fluid, infarct, mass, or skull fracture.  CT CERVICAL SPINE FINDINGS  There is a small pneumothorax on the right. There is a small volume of subcutaneous emphysema dorsally in the right paraspinous soft tissues. There is no cervical spine fracture. There is normal anterior-posterior alignment. There is no prevertebral soft tissue swelling. There is multilevel degenerative disc disease.  IMPRESSION: No acute intracranial abnormality. No evidence of cervical spine fracture. Right pneumothorax and right thoracic subcutaneous emphysema.See report of CT thorax for full details.   Electronically Signed   By: Esperanza Heir M.D.   On: 10/15/2013 08:03   Ct Abdomen Pelvis W Contrast  10/15/2013   CLINICAL DATA:  Multiple falls yesterday, bilateral rib pain, crepitus on right side PHYSICAL EXAMINATION  EXAM: CT CHEST, ABDOMEN, AND PELVIS WITH CONTRAST  TECHNIQUE: Multidetector CT imaging of the chest, abdomen and pelvis was performed following the standard protocol during bolus administration of intravenous contrast.  CONTRAST:  OMNIPAQUE IOHEXOL 300 MG/ML  SOLN  COMPARISON:  None.  FINDINGS: CT CHEST FINDINGS  Coronary and aortic calcification noted. No acute abnormalities involving heart or great vessels. Small bilateral  pleural effusions. Bilateral mild dependent atelectasis. There is a small to moderate right pneumothorax anteriorly measuring up to  18 mm. No left pneumothorax is identified. On the left, there is a mildly displaced fracture of the posterior 12th rib. There is a moderately displaced fracture of the posterior 11th rib. On the right, there is a mildly displaced fracture of the posterior right 9th rib. There is a severely displaced fracture of the posterior lateral 8th rib. There is a severely displaced fracture of the lateral 7th rib. There is a moderately displaced fracture of the lateral 6th rib. In particular, the proximal fragment of the right 7th rib projects about 1 cm in 2 right lung parenchyma with significant surrounding hematoma, hemothorax, and subcutaneous emphysema.  CT ABDOMEN AND PELVIS FINDINGS  Liver and gallbladder are normal. Spleen shows no acute findings. There is an incidental subcentimeter splenic calcification. Pancreas is normal. Adrenal glands are normal. There are a few small renal cysts. There is calcification of the aorta. Bowel is normal. No free air or fluid in the abdomen or pelvis. There is diverticulosis of the sigmoid colon. Bladder is normal. Reproductive organs appear absent. Inferior aspect of the pubic rami are excluded from the field of view. There is a significant compression deformity of the L2 superior endplate. L1 vertebral body shows mild to moderate compression deformity. No retropulsion identified. Fracture line is visible along the superior aspect of L1 vertebral body.  IMPRESSION: 1. Bilateral displaced rib fractures right more numerous and more severely displaced than left. 2. Right pneumothorax. Subcutaneous emphysema. Small bilateral hemo thoraces. 3. L2 superior endplate fracture age indeterminate. L1 compression deformity, age uncertain. Fracture line is visible suggesting acute to subacute fracture. Critical Value/emergent results were called by telephone at the  time of interpretation on 10/15/2013 at 8:30 AM to Dr.FRANCES [facility] , who verbally acknowledged these results.   Electronically Signed   By: Esperanza Heir M.D.   On: 10/15/2013 08:31    Review of Systems  Constitutional: Negative.   HENT: Negative.   Eyes: Negative.   Respiratory: Negative.   Cardiovascular: Positive for chest pain.  Gastrointestinal: Negative.   Genitourinary: Negative.   Musculoskeletal: Positive for falls.  Skin: Negative.   Neurological: Negative.   Endo/Heme/Allergies: Negative.   Psychiatric/Behavioral: Positive for memory loss.    Blood pressure 140/107, pulse 54, temperature 98.5 F (36.9 C), temperature source Oral, resp. rate 17, SpO2 97.00%. Physical Exam  Constitutional: She is oriented to person, place, and time. She appears well-developed and well-nourished.  HENT:  Head: Normocephalic and atraumatic.  Eyes: Conjunctivae and EOM are normal. Pupils are equal, round, and reactive to light.  Neck: Normal range of motion. Neck supple.  nontender posteriorly  Respiratory: Effort normal and breath sounds normal.  GI: Soft. Bowel sounds are normal.  Musculoskeletal: Normal range of motion.  Neurological: She is alert and oriented to person, place, and time.  Skin: Skin is warm and dry.  Psychiatric: She has a normal mood and affect. Her behavior is normal.     Assessment/Plan The pt fell yesterday and sustained some right sided rib fractures and a small pneumothorax. Since this is about 48 hours old I think it would be reasonable to put her in the stepdown ICU and monitor her closely with a repeat CXR in a few hours. I have spoken with family and they agree  TOTH III,Jenilyn Magana S 10/15/2013, 10:16 AM

## 2013-10-15 NOTE — ED Notes (Signed)
Attempted to call report, no nurse available to take report.

## 2013-10-15 NOTE — Telephone Encounter (Signed)
Received Page from Nursing at 5 am. Xray results - No PTX, effusion or acute fracture. Nurse informed me that she felt crepitus just as I did.  I still believe, based on physical exam, that she likely has a rib fracture. Patient continues to have pain, which is severe. Advised RN to send patient to ED for further eval.

## 2013-10-15 NOTE — ED Notes (Signed)
Admitting MD at the bedside.  

## 2013-10-15 NOTE — ED Notes (Signed)
Pt given bedside commode, voided, diaper placed on pt, put back into bed and placed on monitor. Pt comfortable

## 2013-10-15 NOTE — ED Notes (Signed)
Patient back to room from CT

## 2013-10-15 NOTE — Consult Note (Signed)
CC:  Chief Complaint  Patient presents with  . Fall    HPI: Savannah Todd is a 79 y.o. female who was at her nursing home and fell twice yesterday. She does not report any LOC. She currently is c/o primarily right-sided anterior and lateral chest pain. She denies any signficant back pain. She denies any bladder changes or new n/t/w in the extremities.  PMH: Past Medical History  Diagnosis Date  . Thyroid disease     hypothyroidism  . Hypertension   . IBS (irritable bowel syndrome)   . Aortic stenosis   . Memory disorder     mild  . Hyperlipidemia   . GERD (gastroesophageal reflux disease)   . Ulcerative colitis   . Arthritis   . Anxiety     PSH: Past Surgical History  Procedure Laterality Date  . Esophagogastroduodenoscopy  01/30/1993    with biopsy/second duodenum normal/mucosa normal/no ulcerations or any inflammatory activity/no evidence of extrinsic compression or active inflammation/fundus & cardia normal/2 to 3cm hiatal hernia/proximal esophagus normal  . Esophagogastroduodenoscopy  07/18/1991    ampulla normal/pyloric channel,prepyloric area, antrum & body normal/fundus & cardia normal/distal & proximal esophagus normal/  . Abdominal hysterectomy      SH: History  Substance Use Topics  . Smoking status: Never Smoker   . Smokeless tobacco: Not on file  . Alcohol Use: No    MEDS: Prior to Admission medications   Medication Sig Start Date End Date Taking? Authorizing Provider  acetaminophen (TYLENOL) 650 MG CR tablet Take 650 mg by mouth every 8 (eight) hours.   Yes Historical Provider, MD  amLODipine (NORVASC) 5 MG tablet Take 5 mg by mouth daily.   Yes Historical Provider, MD  balsalazide (COLAZAL) 750 MG capsule Take 2 capsules (1,500 mg total) by mouth 3 (three) times daily. 3 tablets TID 08/09/13  Yes Josalyn C Funches, MD  BREWERS YEAST PO Take 2 tablets by mouth 3 (three) times daily.     Yes Historical Provider, MD  Calcium Carbonate Antacid (TUMS PO)  Take 1 tablet by mouth as needed (indigestion).    Yes Historical Provider, MD  Calcium-Vitamin D-Vitamin K (VIACTIV PO) Take 1 tablet by mouth daily.    Yes Historical Provider, MD  cholecalciferol (VITAMIN D) 400 UNITS TABS tablet Take 400 Units by mouth daily.   Yes Historical Provider, MD  digoxin (LANOXIN) 0.125 MG tablet Take 1 tablet (125 mcg total) by mouth daily. 01/08/13  Yes Collene Gobble, MD  diphenhydrAMINE (BENADRYL) 25 mg capsule Take 25 mg by mouth every 6 (six) hours as needed for itching.   Yes Historical Provider, MD  HYDROcodone-acetaminophen (NORCO/VICODIN) 5-325 MG per tablet Take 1 tablet by mouth every 6 (six) hours as needed for pain.   Yes Historical Provider, MD  levothyroxine (SYNTHROID, LEVOTHROID) 125 MCG tablet Take 125 mcg by mouth daily.   Yes Historical Provider, MD  Multiple Vitamin (MULTIVITAMIN PO) Take 1 tablet by mouth daily.     Yes Historical Provider, MD  simvastatin (ZOCOR) 10 MG tablet Take 10 mg by mouth at bedtime.   Yes Historical Provider, MD  acetaminophen (TYLENOL) 500 MG tablet Take 500 mg by mouth every 6 (six) hours as needed for pain (headacheun).     Historical Provider, MD  bisacodyl (DULCOLAX) 10 MG suppository Place 10 mg rectally daily as needed for constipation (if constipation not relieved by milk of magnesia).    Historical Provider, MD  hydrochlorothiazide (HYDRODIURIL) 25 MG tablet 1/2 tablet on Mon,  Wed, and Fri only 07/23/13   Chelle S Jeffery, PA-C  magnesium hydroxide (MILK OF MAGNESIA) 400 MG/5ML suspension Take 30 mLs by mouth daily as needed for constipation.    Historical Provider, MD  sodium phosphate (FLEET) 7-19 GM/118ML ENEM Place 1 enema rectally daily as needed (for constipation not relieved by milk of magnesia and bisacodyl).    Historical Provider, MD    ALLERGY: Allergies  Allergen Reactions  . Niaspan [Niacin Er] Itching  . Bactrim Nausea Only  . Benazepril Hcl Cough  . Naprosyn [Naproxen] Itching    NEUROLOGIC  EXAM: Awake, alert, oriented Speech fluent, appropriate CN grossly intact Motor exam: Upper Extremities Deltoid Bicep Tricep Grip  Right 5/5 5/5 5/5 5/5  Left 5/5 5/5 5/5 5/5   Lower Extremity IP Quad PF DF EHL  Right 5/5 5/5 5/5 5/5 5/5  Left 5/5 5/5 5/5 5/5 5/5   Sensation grossly intact to LT No TTP along midline T/L spine  IMGAING: CTH without ICH or skull fx CT Cspine without fx or sublux. Extensive degen changes seen CT Abd with acute/subacute L2 and L1 compression fractures with fracture of posterior elements.  IMPRESSION: - 79 y.o. female s/p fall with acute/subacute L1 and L2 compression fractures not causing any signficant back pain.   PLAN: - LSO brace when up. - Can f/u in my office if back pain develops and is uncontrolled with medication.

## 2013-10-16 ENCOUNTER — Observation Stay (HOSPITAL_COMMUNITY): Payer: Medicare Other

## 2013-10-16 DIAGNOSIS — S2249XA Multiple fractures of ribs, unspecified side, initial encounter for closed fracture: Secondary | ICD-10-CM | POA: Diagnosis not present

## 2013-10-16 DIAGNOSIS — I10 Essential (primary) hypertension: Secondary | ICD-10-CM | POA: Insufficient documentation

## 2013-10-16 DIAGNOSIS — W19XXXA Unspecified fall, initial encounter: Secondary | ICD-10-CM | POA: Insufficient documentation

## 2013-10-16 DIAGNOSIS — D62 Acute posthemorrhagic anemia: Secondary | ICD-10-CM

## 2013-10-16 LAB — BASIC METABOLIC PANEL
BUN: 13 mg/dL (ref 6–23)
Calcium: 8.7 mg/dL (ref 8.4–10.5)
Chloride: 96 mEq/L (ref 96–112)
GFR calc Af Amer: 88 mL/min — ABNORMAL LOW (ref >90)
GFR calc non Af Amer: 76 mL/min — ABNORMAL LOW (ref >90)
Glucose, Bld: 109 mg/dL — ABNORMAL HIGH (ref 70–99)
Potassium: 3.9 mEq/L (ref 3.5–5.1)
Sodium: 132 mEq/L — ABNORMAL LOW (ref 135–145)

## 2013-10-16 LAB — CBC
HCT: 34.4 % — ABNORMAL LOW (ref 36.0–46.0)
Hemoglobin: 11.4 g/dL — ABNORMAL LOW (ref 12.0–15.0)
MCV: 89.6 fL (ref 78.0–100.0)
RDW: 13 % (ref 11.5–15.5)
WBC: 8.6 10*3/uL (ref 4.0–10.5)

## 2013-10-16 MED ORDER — ENOXAPARIN SODIUM 40 MG/0.4ML ~~LOC~~ SOLN
40.0000 mg | SUBCUTANEOUS | Status: DC
  Administered 2013-10-16 – 2013-10-25 (×10): 40 mg via SUBCUTANEOUS
  Filled 2013-10-16 (×11): qty 0.4

## 2013-10-16 MED ORDER — HYDROCHLOROTHIAZIDE 25 MG PO TABS
25.0000 mg | ORAL_TABLET | ORAL | Status: AC
  Filled 2013-10-16: qty 1

## 2013-10-16 MED ORDER — DEXTROSE 5 % IV SOLN
1.0000 g | INTRAVENOUS | Status: AC
  Administered 2013-10-16 – 2013-10-17 (×2): 1 g via INTRAVENOUS
  Filled 2013-10-16 (×3): qty 10

## 2013-10-16 MED ORDER — DEXTROSE 5 % IV SOLN: 2.0000 g | INTRAVENOUS | Status: DC

## 2013-10-16 MED ORDER — TRAMADOL HCL 50 MG PO TABS
50.0000 mg | ORAL_TABLET | Freq: Four times a day (QID) | ORAL | Status: DC | PRN
  Administered 2013-10-16 (×2): 50 mg via ORAL
  Filled 2013-10-16 (×2): qty 1

## 2013-10-16 MED ORDER — BOOST / RESOURCE BREEZE PO LIQD
1.0000 | Freq: Three times a day (TID) | ORAL | Status: DC
  Administered 2013-10-16 – 2013-10-23 (×18): 1 via ORAL
  Administered 2013-10-23: 20:00:00 via ORAL
  Administered 2013-10-24 – 2013-10-25 (×5): 1 via ORAL

## 2013-10-16 NOTE — Evaluation (Signed)
Physical Therapy Evaluation Patient Details Name: Savannah Todd MRN: [id number] DOB: [birth year] Today's Date: 10/16/2013 Time: 7829-5621 PT Time Calculation (min): 33 min  PT Assessment / Plan / Recommendation History of Present Illness  Savannah Todd is a 79 y.o. female who was at her nursing home and fell twice yesterdayCT Abd with acute/subacute L2 and L1 compression fractures with fracture of posterior elements. Also with rib fractures.  Clinical Impression  Pt admitted with above injuries. Presents to PT with dependencies in mobility and will benefit from skilled PT to return to baseline level of function.  Pt very pleasant and cooperative throughout session. No family present.  .  Did not ambulate pt today as Dr Conchita Paris wrote for a brace in his note for OOB but did not order a brace, therefore did not ambulate pt today. Pt will benefit from mobilizing with nursing as well, especially once brace situation is figured out.     PT Assessment  Patient needs continued PT services    Follow Up Recommendations  SNF    Does the patient have the potential to tolerate intense rehabilitation      Barriers to Discharge        Equipment Recommendations  Rolling walker with 5" wheels    Recommendations for Other Services     Frequency Min 3X/week    Precautions / Restrictions Precautions Precautions: Fall Required Braces or Orthoses: Spinal Brace (IN neurosurgeon note to be on when up, but brace not ordred ) Spinal Brace: Other (comment) (unknown. ) Restrictions Weight Bearing Restrictions: No   Pertinent Vitals/Pain Pt with significant pain with position changes in ribs and low back.  Did not rate. RN reports pt is getting pain meds and that pain appears less than earlier today.        Mobility  Bed Mobility Bed Mobility: Supine to Sit;Sit to Supine Supine to Sit: 3: Mod assist Sit to Supine: 3: Mod assist Details for Bed Mobility Assistance: pt preferred to sit straight up  as she said rolling is too painful. Held to PT when sitting up and laying down.  Transfers Transfers: Sit to Stand;Stand to [employer] Transfers Sit to Stand: 4: Min assist Stand to Sit: 4: Min assist;To chair/3-in-1;To bed Stand Pivot Transfers: 4: Min assist Details for Transfer Assistance: Pt held to PT's arms for transfer and standing (Pt used BSC during session and was incontinent of urine when standing from [facility]) Ambulation/Gait Assistance: Not tested (comment) (No back brace in room therefore deferred )  Increased time for all mobility due to pain.     Exercises General Exercises - Lower Extremity Ankle Circles/Pumps: AROM;Both;5 reps;Supine   PT Diagnosis: Difficulty walking;Acute pain  PT Problem List: Decreased strength;Decreased activity tolerance;Decreased balance;Decreased mobility;Decreased knowledge of use of DME;Pain PT Treatment Interventions: DME instruction;Gait training;Functional mobility training;Therapeutic activities;Therapeutic exercise;Balance training;Patient/family education     PT Goals(Current goals can be found in the care plan section) Acute Rehab PT Goals Patient Stated Goal: pt agreed to decrease pain and improve mobility PT Goal Formulation: With patient Time For Goal Achievement: 10/23/13 Potential to Achieve Goals: Good  Visit Information  Last PT Received On: 10/16/13 Assistance Needed: +1 History of Present Illness: Savannah Todd is a 79 y.o. female who was at her nursing home and fell twice yesterdayCT Abd with acute/subacute L2 and L1 compression fractures with fracture of posterior elements. Also with rib fractures.       Prior Functioning  Home Living Family/patient expects to be discharged  to:: Skilled nursing facility Prior Function Level of Independence: Needs assistance Gait / Transfers Assistance Needed: per pt: was walking with walker independently Communication Communication: No difficulties    Cognition   Cognition Arousal/Alertness: Awake/alert Behavior During Therapy: WFL for tasks assessed/performed Overall Cognitive Status: History of cognitive impairments - at baseline Memory: Decreased short-term memory (oriented to person, place and situation at time of eval). Did however ask some of the same questions multiple times during eval.   Extremity/Trunk Assessment Upper Extremity Assessment Upper Extremity Assessment: Defer to OT evaluation Lower Extremity Assessment Lower Extremity Assessment: Generalized weakness   Balance Balance Balance Assessed: Yes (Pt stood for about 1 minute twice with min A, holding to PTs)  End of Session PT - End of Session Equipment Utilized During Treatment: Oxygen Activity Tolerance: Patient limited by pain Patient left: in bed;with call bell/phone within reach;with bed alarm set Nurse Communication: Mobility status;Other (comment) (Neurosurgeon wrote for brace in notes, but did not order. )  GP Functional Assessment Tool Used: clinical judgment Functional Limitation: Mobility: Walking and moving around Mobility: Walking and Moving Around Current Status [phone]): At least 40 percent but less than 60 percent impaired, limited or restricted Mobility: Walking and Moving Around Goal Status [phone]): At least 1 percent but less than 20 percent impaired, limited or restricted   Donnella Sham 10/16/2013, 3:34 PM [place], PT  [phone] 10/16/2013

## 2013-10-16 NOTE — Progress Notes (Signed)
Lg amount

## 2013-10-16 NOTE — Assessment & Plan Note (Addendum)
A: Fall with left flank injury. Exam is consistent with musculoskeletal contusion. There is minimal concern for lumbar vertebral fracture but I will obtain a x-ray of her lumbar spine to rule this out. There is no concern for hip fracture dislocation therefore I have discontinued the order for the hip x-rays. Pain control: Tylenol 650 mg 3 times a day.

## 2013-10-16 NOTE — Progress Notes (Signed)
UR completed.  Keyira Mondesir, RN BSN [facility] CCM Trauma/Neuro ICU Case Manager [phone]  

## 2013-10-16 NOTE — Progress Notes (Signed)
Pt BP remained elevated after BP medication was given. Dr Janee Morn made aware and gave orders. Will continue to monitor.

## 2013-10-16 NOTE — Assessment & Plan Note (Addendum)
Patient with intermittently elevated blood pressure. BP is normal today. Patient with known history of aortic stenosis her last echocardiogram on 04/17/10 showed an aortic valve area of 0.8.  Plan to continue amlodipine, HCTZ and digoxin. I question whether continue followup with cardiology if still indicated or would alter the patient's course. Whether or not to refer patient to cardiology as a discussion as to be just had with her son's.

## 2013-10-16 NOTE — Progress Notes (Signed)
desated so watching in SDU. Add spirometer. Patient examined and I agree with the assessment and plan  Violeta Gelinas, MD, MPH, FACS Pager: [phone]  10/16/2013 3:19 PM

## 2013-10-16 NOTE — Progress Notes (Signed)
Patient ID: Savannah Todd, female   DOB: 1920/08/30, 79 y.o.   MRN: [id number]  LOS: 1 day   Subjective: Pt alert and oriented.  C/o right upper chest wall pain.  Denies sob, denies LBP.    Objective: Vital signs in last 24 hours: Temp:  [98 F (36.7 C)-98.5 F (36.9 C)] 98.5 F (36.9 C) (10/27 0345) Pulse Rate:  [50-82] 63 (10/27 0345) Resp:  [8-20] 12 (10/27 0345) BP: (125-175)/(46-107) 167/64 mmHg (10/27 0345) SpO2:  [76 %-99 %] 96 % (10/27 0345) Weight:  [136 lb 14.5 oz (62.1 kg)] 136 lb 14.5 oz (62.1 kg) (10/26 2318) Last BM Date:  (unkown)  Lab Results:  CBC  Recent Labs  10/15/13 0634 10/15/13 0704 10/16/13 0414  WBC 12.1*  --  8.6  HGB 12.2 12.6 11.4*  HCT 35.6* 37.0 34.4*  PLT 253  --  248   BMET  Recent Labs  10/15/13 0704 10/16/13 0414  NA 133* 132*  K 3.5 3.9  CL 95* 96  CO2  --  29  GLUCOSE 123* 109*  BUN 16 13  CREATININE 0.90 0.64  CALCIUM  --  8.7    Imaging: Ct Head Wo Contrast  10/15/2013   CLINICAL DATA:  Patient fell twice yesterday  EXAM: CT HEAD WITHOUT CONTRAST  CT CERVICAL SPINE WITHOUT CONTRAST  TECHNIQUE: Multidetector CT imaging of the head and cervical spine was performed following the standard protocol without intravenous contrast. Multiplanar CT image reconstructions of the cervical spine were also generated.  COMPARISON:  09/14/2013  FINDINGS: CT HEAD FINDINGS  Anticipated age-related atrophy and low attenuation in the white matter. No hemorrhage, extra-axial fluid, infarct, mass, or skull fracture.  CT CERVICAL SPINE FINDINGS  There is a small pneumothorax on the right. There is a small volume of subcutaneous emphysema dorsally in the right paraspinous soft tissues. There is no cervical spine fracture. There is normal anterior-posterior alignment. There is no prevertebral soft tissue swelling. There is multilevel degenerative disc disease.  IMPRESSION: No acute intracranial abnormality. No evidence of cervical spine fracture. Right  pneumothorax and right thoracic subcutaneous emphysema.See report of CT thorax for full details.   Electronically Signed   By: Esperanza Heir M.D.   On: 10/15/2013 08:03   Ct Chest W Contrast  10/15/2013   CLINICAL DATA:  Multiple falls yesterday, bilateral rib pain, crepitus on right side PHYSICAL EXAMINATION  EXAM: CT CHEST, ABDOMEN, AND PELVIS WITH CONTRAST  TECHNIQUE: Multidetector CT imaging of the chest, abdomen and pelvis was performed following the standard protocol during bolus administration of intravenous contrast.  CONTRAST:  OMNIPAQUE IOHEXOL 300 MG/ML  SOLN  COMPARISON:  None.  FINDINGS: CT CHEST FINDINGS  Coronary and aortic calcification noted. No acute abnormalities involving heart or great vessels. Small bilateral pleural effusions. Bilateral mild dependent atelectasis. There is a small to moderate right pneumothorax anteriorly measuring up to 18 mm. No left pneumothorax is identified. On the left, there is a mildly displaced fracture of the posterior 12th rib. There is a moderately displaced fracture of the posterior 11th rib. On the right, there is a mildly displaced fracture of the posterior right 9th rib. There is a severely displaced fracture of the posterior lateral 8th rib. There is a severely displaced fracture of the lateral 7th rib. There is a moderately displaced fracture of the lateral 6th rib. In particular, the proximal fragment of the right 7th rib projects about 1 cm in 2 right lung parenchyma with significant surrounding hematoma,  hemothorax, and subcutaneous emphysema.  CT ABDOMEN AND PELVIS FINDINGS  Liver and gallbladder are normal. Spleen shows no acute findings. There is an incidental subcentimeter splenic calcification. Pancreas is normal. Adrenal glands are normal. There are a few small renal cysts. There is calcification of the aorta. Bowel is normal. No free air or fluid in the abdomen or pelvis. There is diverticulosis of the sigmoid colon. Bladder is normal.  Reproductive organs appear absent. Inferior aspect of the pubic rami are excluded from the field of view. There is a significant compression deformity of the L2 superior endplate. L1 vertebral body shows mild to moderate compression deformity. No retropulsion identified. Fracture line is visible along the superior aspect of L1 vertebral body.  IMPRESSION: 1. Bilateral displaced rib fractures right more numerous and more severely displaced than left. 2. Right pneumothorax. Subcutaneous emphysema. Small bilateral hemo thoraces. 3. L2 superior endplate fracture age indeterminate. L1 compression deformity, age uncertain. Fracture line is visible suggesting acute to subacute fracture. Critical Value/emergent results were called by telephone at the time of interpretation on 10/15/2013 at 8:30 AM to Dr.FRANCES [facility] , who verbally acknowledged these results.   Electronically Signed   By: Esperanza Heir M.D.   On: 10/15/2013 08:31   Ct Cervical Spine Wo Contrast  10/15/2013   CLINICAL DATA:  Patient fell twice yesterday  EXAM: CT HEAD WITHOUT CONTRAST  CT CERVICAL SPINE WITHOUT CONTRAST  TECHNIQUE: Multidetector CT imaging of the head and cervical spine was performed following the standard protocol without intravenous contrast. Multiplanar CT image reconstructions of the cervical spine were also generated.  COMPARISON:  09/14/2013  FINDINGS: CT HEAD FINDINGS  Anticipated age-related atrophy and low attenuation in the white matter. No hemorrhage, extra-axial fluid, infarct, mass, or skull fracture.  CT CERVICAL SPINE FINDINGS  There is a small pneumothorax on the right. There is a small volume of subcutaneous emphysema dorsally in the right paraspinous soft tissues. There is no cervical spine fracture. There is normal anterior-posterior alignment. There is no prevertebral soft tissue swelling. There is multilevel degenerative disc disease.  IMPRESSION: No acute intracranial abnormality. No evidence of cervical spine  fracture. Right pneumothorax and right thoracic subcutaneous emphysema.See report of CT thorax for full details.   Electronically Signed   By: Esperanza Heir M.D.   On: 10/15/2013 08:03   Ct Abdomen Pelvis W Contrast  10/15/2013   CLINICAL DATA:  Multiple falls yesterday, bilateral rib pain, crepitus on right side PHYSICAL EXAMINATION  EXAM: CT CHEST, ABDOMEN, AND PELVIS WITH CONTRAST  TECHNIQUE: Multidetector CT imaging of the chest, abdomen and pelvis was performed following the standard protocol during bolus administration of intravenous contrast.  CONTRAST:  OMNIPAQUE IOHEXOL 300 MG/ML  SOLN  COMPARISON:  None.  FINDINGS: CT CHEST FINDINGS  Coronary and aortic calcification noted. No acute abnormalities involving heart or great vessels. Small bilateral pleural effusions. Bilateral mild dependent atelectasis. There is a small to moderate right pneumothorax anteriorly measuring up to 18 mm. No left pneumothorax is identified. On the left, there is a mildly displaced fracture of the posterior 12th rib. There is a moderately displaced fracture of the posterior 11th rib. On the right, there is a mildly displaced fracture of the posterior right 9th rib. There is a severely displaced fracture of the posterior lateral 8th rib. There is a severely displaced fracture of the lateral 7th rib. There is a moderately displaced fracture of the lateral 6th rib. In particular, the proximal fragment of the right 7th rib  projects about 1 cm in 2 right lung parenchyma with significant surrounding hematoma, hemothorax, and subcutaneous emphysema.  CT ABDOMEN AND PELVIS FINDINGS  Liver and gallbladder are normal. Spleen shows no acute findings. There is an incidental subcentimeter splenic calcification. Pancreas is normal. Adrenal glands are normal. There are a few small renal cysts. There is calcification of the aorta. Bowel is normal. No free air or fluid in the abdomen or pelvis. There is diverticulosis of the sigmoid  colon. Bladder is normal. Reproductive organs appear absent. Inferior aspect of the pubic rami are excluded from the field of view. There is a significant compression deformity of the L2 superior endplate. L1 vertebral body shows mild to moderate compression deformity. No retropulsion identified. Fracture line is visible along the superior aspect of L1 vertebral body.  IMPRESSION: 1. Bilateral displaced rib fractures right more numerous and more severely displaced than left. 2. Right pneumothorax. Subcutaneous emphysema. Small bilateral hemo thoraces. 3. L2 superior endplate fracture age indeterminate. L1 compression deformity, age uncertain. Fracture line is visible suggesting acute to subacute fracture. Critical Value/emergent results were called by telephone at the time of interpretation on 10/15/2013 at 8:30 AM to Dr.FRANCES [facility] , who verbally acknowledged these results.   Electronically Signed   By: Esperanza Heir M.D.   On: 10/15/2013 08:31   Dg Chest Port 1 View  10/15/2013   CLINICAL DATA:  Trauma with pneumothorax  EXAM: PORTABLE CHEST - 1 VIEW  COMPARISON:  The chest CT obtained earlier in the day  FINDINGS: There are displaced rib fractures on the right with soft tissue air. The right base pneumothorax seen on CT is not convincingly seen on portable chest radiographic examination.  There is patchy atelectasis in both lung bases. Elsewhere lungs are clear. Heart is mildly enlarged with normal pulmonary vascularity. There is atherosclerotic change in the aorta.  IMPRESSION: Persistent patchy bibasilar atelectasis. Displaced rib fractures on the right with soft tissue air. The right base pneumothorax seen by CT the earlier in the day is not convincingly seen on this study. A pneumothorax of this nature the may be difficult to discern on a single portable chest examination. No tension component.  When patient is clinically able, upright PA and lateral chest radiograph the would be advisable to better  assess for right-sided pneumothorax.   Electronically Signed   By: Bretta Bang M.D.   On: 10/15/2013 14:50     PE: General appearance: alert, cooperative, appears stated age and no distress Head: Normocephalic, without obvious abnormality, atraumatic Eyes: conjunctivae/corneas clear. PERRL, EOM's intact. Fundi benign. Resp: clear to auscultation bilaterally Cardio: regular rate and rhythm, S1, S2 normal, 3/6 SEM. No click, rub or gallop GI: soft, non-tender; bowel sounds normal; no masses,  no organomegaly Extremities: extremities normal, atraumatic, no cyanosis or edema Neurologic: no focal changes   Patient Active Problem List   Diagnosis Date Noted  . History of recent fall 07/27/2013  . Person living in residential institution 07/27/2013  . Aortic stenosis 06/05/2011  . Dementia, in, senility 06/05/2011  . Hypothyroidism 06/05/2011  . Irritable bowel syndrome 06/05/2011  . Benign hypertensive heart disease without heart failure 06/05/2011  . Hypercholesterolemia 06/05/2011    Assessment/Plan: Fall Right sided rib fractures/PTX -repeat PA/Lateral CXR to evaluate PTX. Oxygenation normal.   -aggressive pulmonary toilet -pain control -PT eval and treat UTI -Urine Cx pending -DC foley -start rocephin ABL anemia - mild, stable Chronic medical problems-AVS, hypothyroid, dementia, CAD are stable  VTE - SCD's, Lovenox, mobilize  FEN - advance diet, PO pain meds, SL IV Dispo -- transfer to floor pending CXR   Ashok Norris, ANP-BC General Trauma PA Pager: [phone]   10/16/2013 8:35 AM

## 2013-10-16 NOTE — Progress Notes (Signed)
Pt c/o of being unable to void placed on bed pan x 2 with minimal urine out, pt's bladder scanned multiple passes showed greater than 400 cc of urine. Dr. Carolynne Edouard called order given to place foley. Will proceed and continue to monitor

## 2013-10-16 NOTE — Progress Notes (Signed)
INITIAL NUTRITION ASSESSMENT  DOCUMENTATION CODES Per approved criteria  -Not Applicable   INTERVENTION:  Diet advancement as medically able; recommend regular diet.  Resource Breeze PO TID, each supplement provides 250 kcal and 9 grams of protein.  NUTRITION DIAGNOSIS: Increased nutrient needs related to recent fall with rib fractures as evidenced by estimated nutrition needs.   Goal: Intake to meet >90% of estimated nutrition needs.  Monitor:  Diet advancement, PO intake, labs, weight trend.  Reason for Assessment: MST=2  79 y.o. female  Admitting Dx: S/P fall x 2 at SNF  ASSESSMENT: Patient was brought to the ER on 10/26 where CT showed a small right sided pneumothorax and right rib fractures.   Patient reports good intake PTA, but that she's lost weight because "I've been walking." She reports that she was not trying to lose weight, but she was trying to prevent gaining weight. According to review of usual weights, patient has been gradually gaining weight over the past two years. Patient states that she has been eating well and was recently told to avoid foods high in fat.  Nutrition Focused Physical Exam:  Subcutaneous Fat:  Orbital Region: WNL Upper Arm Region: WNL Thoracic and Lumbar Region: WNL  Muscle:  Temple Region: mild-moderate depletion Clavicle Bone Region: mild-moderate depletion Clavicle and Acromion Bone Region: mild-moderate depletion Scapular Bone Region: WNL Dorsal Hand: severe depletion Patellar Region: WNL Anterior Thigh Region: WNL Posterior Calf Region: WNL  Edema: none   Height: Ht Readings from Last 1 Encounters:  10/15/13 5\' 1"  (1.549 m)    Weight: Wt Readings from Last 1 Encounters:  10/15/13 136 lb 14.5 oz (62.1 kg)    Ideal Body Weight: 47.7 kg  % Ideal Body Weight: 130%  Wt Readings from Last 10 Encounters:  10/15/13 136 lb 14.5 oz (62.1 kg)  07/20/13 130 lb 9.6 oz (59.24 kg)  11/11/12 133 lb 6.4 oz (60.51 kg)   06/01/12 118 lb (53.524 kg)  10/20/11 124 lb 12.8 oz (56.609 kg)  06/05/11 125 lb (56.7 kg)  02/06/11 119 lb 6 oz (54.148 kg)    Usual Body Weight: 140 lbs per patient; 130 lb (3 months ago per above usual weights)  % Usual Body Weight: 105%  BMI:  Body mass index is 25.88 kg/(m^2).  Estimated Nutritional Needs: Kcal: 1350-1550 Protein: 75-85 gm Fluid: 1.4-1.6 L  Skin: bruise on ribs from fall  Diet Order: Clear Liquid  EDUCATION NEEDS: -No education needs identified at this time   Intake/Output Summary (Last 24 hours) at 10/16/13 1122 Last data filed at 10/16/13 0915  Gross per 24 hour  Intake 875.83 ml  Output    925 ml  Net -49.17 ml    Last BM: unknown   Labs:   Recent Labs Lab 10/15/13 0704 10/16/13 0414  NA 133* 132*  K 3.5 3.9  CL 95* 96  CO2  --  29  BUN 16 13  CREATININE 0.90 0.64  CALCIUM  --  8.7  GLUCOSE 123* 109*    CBG (last 3)  No results found for this basename: GLUCAP,  in the last 72 hours  Scheduled Meds: . acetaminophen  650 mg Oral Q8H  . amLODipine  5 mg Oral Daily  . balsalazide  1,500 mg Oral TID  . cefTRIAXone (ROCEPHIN)  IV  2 g Intravenous Q24H  . digoxin  125 mcg Oral Daily  . enoxaparin (LOVENOX) injection  40 mg Subcutaneous Q24H  . hydrochlorothiazide  25 mg Oral Daily  .  levothyroxine  125 mcg Oral QAC breakfast  . pantoprazole  40 mg Oral Daily   Or  . pantoprazole (PROTONIX) IV  40 mg Intravenous Daily  . simvastatin  10 mg Oral QHS    Continuous Infusions: . dextrose 5 % and 0.9 % NaCl with KCl 20 mEq/L 50 mL/hr at 10/15/13 1453    Past Medical History  Diagnosis Date  . Thyroid disease     hypothyroidism  . Hypertension   . IBS (irritable bowel syndrome)   . Aortic stenosis   . Memory disorder     mild  . Hyperlipidemia   . GERD (gastroesophageal reflux disease)   . Ulcerative colitis   . Arthritis   . Anxiety     Past Surgical History  Procedure Laterality Date  .  Esophagogastroduodenoscopy  01/30/1993    with biopsy/second duodenum normal/mucosa normal/no ulcerations or any inflammatory activity/no evidence of extrinsic compression or active inflammation/fundus & cardia normal/2 to 3cm hiatal hernia/proximal esophagus normal  . Esophagogastroduodenoscopy  07/18/1991    ampulla normal/pyloric channel,prepyloric area, antrum & body normal/fundus & cardia normal/distal & proximal esophagus normal/  . Abdominal hysterectomy      Joaquin Courts, RD, LDN, CNSC Pager [phone] After Hours Pager [phone]

## 2013-10-17 DIAGNOSIS — S32010A Wedge compression fracture of first lumbar vertebra, initial encounter for closed fracture: Secondary | ICD-10-CM

## 2013-10-17 DIAGNOSIS — S32020A Wedge compression fracture of second lumbar vertebra, initial encounter for closed fracture: Secondary | ICD-10-CM

## 2013-10-17 DIAGNOSIS — S270XXA Traumatic pneumothorax, initial encounter: Secondary | ICD-10-CM

## 2013-10-17 DIAGNOSIS — S2241XA Multiple fractures of ribs, right side, initial encounter for closed fracture: Secondary | ICD-10-CM

## 2013-10-17 DIAGNOSIS — D62 Acute posthemorrhagic anemia: Secondary | ICD-10-CM | POA: Diagnosis not present

## 2013-10-17 DIAGNOSIS — W19XXXA Unspecified fall, initial encounter: Secondary | ICD-10-CM

## 2013-10-17 LAB — URINE CULTURE: Colony Count: >=100000

## 2013-10-17 MED ORDER — TRAMADOL HCL 50 MG PO TABS
100.0000 mg | ORAL_TABLET | Freq: Four times a day (QID) | ORAL | Status: DC
  Administered 2013-10-17 – 2013-10-25 (×30): 100 mg via ORAL
  Filled 2013-10-17 (×33): qty 2

## 2013-10-17 MED ORDER — MORPHINE SULFATE 2 MG/ML IJ SOLN
2.0000 mg | INTRAMUSCULAR | Status: DC | PRN
  Administered 2013-10-21: 2 mg via INTRAVENOUS
  Filled 2013-10-17: qty 1

## 2013-10-17 MED ORDER — IBUPROFEN 800 MG PO TABS
800.0000 mg | ORAL_TABLET | Freq: Three times a day (TID) | ORAL | Status: AC
  Administered 2013-10-17 – 2013-10-18 (×6): 800 mg via ORAL
  Filled 2013-10-17 (×8): qty 1

## 2013-10-17 MED ORDER — IPRATROPIUM-ALBUTEROL 20-100 MCG/ACT IN AERS
1.0000 | INHALATION_SPRAY | Freq: Four times a day (QID) | RESPIRATORY_TRACT | Status: DC
  Administered 2013-10-17 – 2013-10-21 (×15): 1 via RESPIRATORY_TRACT
  Filled 2013-10-17: qty 4

## 2013-10-17 MED ORDER — POLYETHYLENE GLYCOL 3350 17 G PO PACK
17.0000 g | PACK | Freq: Every day | ORAL | Status: DC
  Administered 2013-10-17 – 2013-10-22 (×5): 17 g via ORAL
  Filled 2013-10-17 (×7): qty 1

## 2013-10-17 MED ORDER — DOCUSATE SODIUM 100 MG PO CAPS
100.0000 mg | ORAL_CAPSULE | Freq: Two times a day (BID) | ORAL | Status: DC
  Administered 2013-10-17 – 2013-10-19 (×5): 100 mg via ORAL
  Filled 2013-10-17 (×4): qty 1

## 2013-10-17 MED ORDER — HYDROCODONE-ACETAMINOPHEN 5-325 MG PO TABS
0.5000 | ORAL_TABLET | ORAL | Status: DC | PRN
  Administered 2013-10-17 – 2013-10-18 (×2): 1 via ORAL
  Filled 2013-10-17 (×2): qty 1

## 2013-10-17 NOTE — Progress Notes (Signed)
Patient ID: Savannah Todd, female   DOB: 09-Apr-1920, 79 y.o.   MRN: [id number]   LOS: 2 days   Subjective: Confused this morning.    Objective: Vital signs in last 24 hours: Temp:  [98.1 F (36.7 C)-98.4 F (36.9 C)] 98.1 F (36.7 C) (10/28 0744) Pulse Rate:  [56-66] 57 (10/28 0748) Resp:  [13-21] 17 (10/28 0748) BP: (170-188)/(53-87) 188/59 mmHg (10/28 0748) SpO2:  [94 %-99 %] 99 % (10/28 0748) Last BM Date:  (unkown)   Physical Exam General appearance: no distress and but moans at regular intervals Resp: rales bilaterally Cardio: Bradycardic, murmur GI: normal findings: bowel sounds normal and soft, non-tender   Assessment/Plan: Fall  Right sided rib fractures/PTX -- Pulmonary toilet L1/L2 compression fxs of uncertain chronicity -- LSO brace UTI -- Culture grew E coli, d/c rocephin after dose today (3d) ABL anemia - mild, stable  Chronic medical problems- Home meds VTE - SCD's, Lovenox, mobilize  FEN - Schedule tramadol, add NSAID (will have to watch for allergic rxn as pt has listed allergy to Naproxen resulting in itching), encourage orals for pain Dispo -- Transfer to tele. Made pt DNR in accordance with wishes while in SNF    Freeman Caldron, PA-C Pager: [phone] General Trauma PA Pager: [phone]   10/17/2013

## 2013-10-17 NOTE — Progress Notes (Signed)
Report called to RN on 6N. Son Jane notified of room change all belongings went with pt. Pt transported via bed by RN.

## 2013-10-17 NOTE — Progress Notes (Signed)
Orthopedic Tech Progress Note Patient Details:  Savannah Todd January 19, 1920 [id number]  Patient ID: [name], female   DOB: Oct 03, 1920, 79 y.o.   MRN: [id number]   [name] 10/17/2013, 8:43 AM Called [employer] for saccal brace.

## 2013-10-17 NOTE — Progress Notes (Signed)
UR completed.  Pattiann Solanki, RN BSN [facility] CCM Trauma/Neuro ICU Case Manager [phone]  

## 2013-10-17 NOTE — Progress Notes (Signed)
A little confused. Transfer to tele. Patient examined and I agree with the assessment and plan  Violeta Gelinas, MD, MPH, FACS Pager: [phone]  10/17/2013 11:12 AM

## 2013-10-18 ENCOUNTER — Inpatient Hospital Stay (HOSPITAL_COMMUNITY): Payer: Medicare Other

## 2013-10-18 DIAGNOSIS — K560 Paralytic ileus: Secondary | ICD-10-CM

## 2013-10-18 DIAGNOSIS — N390 Urinary tract infection, site not specified: Secondary | ICD-10-CM

## 2013-10-18 LAB — URINALYSIS, ROUTINE W REFLEX MICROSCOPIC
Bilirubin Urine: NEGATIVE
Glucose, UA: NEGATIVE mg/dL
Ketones, ur: NEGATIVE mg/dL
Leukocytes, UA: NEGATIVE
Protein, ur: 100 mg/dL — AB
Urobilinogen, UA: 0.2 mg/dL (ref 0.0–1.0)

## 2013-10-18 LAB — URINE MICROSCOPIC-ADD ON

## 2013-10-18 MED ORDER — TRAMADOL HCL 50 MG PO TABS: 100.0000 mg | ORAL_TABLET | Freq: Four times a day (QID) | ORAL | Status: DC

## 2013-10-18 MED ORDER — IBUPROFEN 800 MG PO TABS: 800.0000 mg | ORAL_TABLET | Freq: Three times a day (TID) | ORAL | Status: DC

## 2013-10-18 MED ORDER — BISACODYL 10 MG RE SUPP
10.0000 mg | Freq: Once | RECTAL | Status: AC
  Administered 2013-10-18: 10 mg via RECTAL
  Filled 2013-10-18: qty 1

## 2013-10-18 MED ORDER — POTASSIUM CHLORIDE IN NACL 20-0.45 MEQ/L-% IV SOLN
INTRAVENOUS | Status: DC
  Administered 2013-10-18 – 2013-10-19 (×2): via INTRAVENOUS
  Filled 2013-10-18 (×4): qty 1000

## 2013-10-18 MED ORDER — BOOST / RESOURCE BREEZE PO LIQD: 1.0000 | Freq: Three times a day (TID) | ORAL | Status: DC

## 2013-10-18 NOTE — Progress Notes (Signed)
Physical Therapy Treatment Patient Details Name: Savannah Todd MRN: [id number] DOB: [birth year] Today's Date: 10/18/2013 Time: 4098-1191 PT Time Calculation (min): 29 min  PT Assessment / Plan / Recommendation  History of Present Illness Savannah Todd is a 79 y.o. female who was at her nursing home and fell twice yesterdayCT Abd with acute/subacute L2 and L1 compression fractures with fracture of posterior elements. Also with rib fractures.   PT Comments   Pt extremely limited by cognition today to complete bed mobility and transfers.  Gait was not attempted because of her assistance level today.  Pt verbalized that she understood one-step commands but would not initiate tasks.  Follow Up Recommendations  SNF     Does the patient have the potential to tolerate intense rehabilitation     Barriers to Discharge        Equipment Recommendations  Rolling walker with 5" wheels    Recommendations for Other Services    Frequency Min 3X/week   Progress towards PT Goals Progress towards PT goals: Not progressing toward goals - comment (Due to cognition)  Plan      Precautions / Restrictions Precautions Precautions: Fall Precaution Comments: pt also with significant dementia Required Braces or Orthoses: Spinal Brace Spinal Brace: Lumbar corset;Applied in sitting position Spinal Brace Comments: donned brace in sitting.  Pt tolerated well. Restrictions Weight Bearing Restrictions: No   Pertinent Vitals/Pain Pt denied pain throughout tx.    Mobility  Bed Mobility Bed Mobility: Rolling Right;Right Sidelying to Sit;Sitting - Scoot to [place] of Bed Rolling Right: 2: Max assist Right Sidelying to Sit: 2: Max assist Supine to Sit: 3: Mod assist Sitting - Scoot to Edge of Bed: 2: Max assist Sit to Supine: 2: Max assist Details for Bed Mobility Assistance: Pt required step by step cues for motor planning today.  Appear she may be worse cognitively than she was on admit.  Pt would verbalize  understanding of commands but would not initiate bed mobility tasks. Transfers Transfers: Sit to Stand;Stand to Sit;Stand Pivot Transfers Sit to Stand: 1: +2 Total assist Sit to Stand: Patient Percentage: 40% Stand to Sit: 3: Mod assist Stand Pivot Transfers: 1: +2 Total assist Stand Pivot Transfers: Patient Percentage: 40% Details for Transfer Assistance: Pt held to OT arms to stand to get to [facility]. Ambulation/Gait Ambulation/Gait Assistance: Not tested (comment) Wheelchair Mobility Wheelchair Mobility: No    Exercises     PT Diagnosis:    PT Problem List:   PT Treatment Interventions:     PT Goals (current goals can now be found in the care plan section) Acute Rehab PT Goals Patient Stated Goal: none stated.  Pt unable to understand when goals were offered.  Visit Information  Last PT Received On: 10/18/13 Assistance Needed: +2 (For safety and ambulation) History of Present Illness: Savannah Todd is a 79 y.o. female who was at her nursing home and fell twice yesterdayCT Abd with acute/subacute L2 and L1 compression fractures with fracture of posterior elements. Also with rib fractures.    Subjective Data  Patient Stated Goal: none stated.  Pt unable to understand when goals were offered.   Cognition  Cognition Arousal/Alertness: Awake/alert Behavior During Therapy: WFL for tasks assessed/performed Overall Cognitive Status: History of cognitive impairments - at baseline Area of Impairment: Orientation;Attention;Memory;Following commands;Safety/judgement;Awareness;Problem solving Orientation Level: Disoriented to;Place;Time;Situation Current Attention Level: Focused Memory: Decreased short-term memory Following Commands: Follows one step commands inconsistently Safety/Judgement: Decreased awareness of safety;Decreased awareness of deficits Awareness: Intellectual Problem Solving:  Slow processing;Decreased initiation;Difficulty sequencing;Requires verbal cues General  Comments: Pt has baseline dementia but his appears to be more severe than her baseline.    Balance  Balance Balance Assessed: Yes  End of Session PT - End of Session Equipment Utilized During Treatment: Gait belt Activity Tolerance: Other (comment) (Limited by cognition) Patient left: in chair;with call bell/phone within reach;with family/visitor present Nurse Communication: Mobility status   GP     Ernestina Columbia, SPTA 10/18/2013, 2:57 PM

## 2013-10-18 NOTE — Clinical Social Work Note (Signed)
Clinical Social Worker continuing to follow patient and family for support and discharge planning needs.  Patient is a current resident at [facility] and plans to return at discharge.  CSW spoke with facility and patient son who are both agreeable with patient return once medically stable.  CSW remains available for support and to facilitate patient discharge needs once medically ready.  Macario Golds, [place] [phone]

## 2013-10-18 NOTE — Progress Notes (Signed)
Patient ID: Savannah Todd, female   DOB: 03/18/1920, 79 y.o.   MRN: [id number]   LOS: 3 days   Subjective: RN reports pt vomited after dinner last night and breakfast this am. Denies nausea or abd pain now. Son reports patient is more confused than usual.   Objective: Vital signs in last 24 hours: Temp:  [97.8 F (36.6 C)-98.6 F (37 C)] 97.8 F (36.6 C) (10/29 0504) Pulse Rate:  [55-77] 77 (10/29 0504) Resp:  [17-18] 18 (10/29 0504) BP: (151-182)/(61-82) 157/72 mmHg (10/29 0602) SpO2:  [93 %-99 %] 93 % (10/29 0504) Weight:  [144 lb 8 oz (65.545 kg)] 144 lb 8 oz (65.545 kg) (10/28 1143) Last BM Date:  (unkown)   Laboratory  UA: Pending   Physical Exam General appearance: alert and no distress Resp: clear to auscultation bilaterally Cardio: regular rate and rhythm GI: Mild distension, mildly firm, NT, +BS   Assessment/Plan: Fall  Right sided rib fractures/PTX -- Pulmonary toilet  L1/L2 compression fxs of uncertain chronicity -- LSO brace  UTI -- Recheck UA with continued confusion ABL anemia - mild, stable  Chronic medical problems- Home meds  VTE - SCD's, Lovenox, mobilize  FEN - Suspect ileus from spinal fxs, get abd x-ray. Consider prokinetic. Decrease diet to clears. Dispo -- Will d/c tele given DNR status, may need to investigate increased confusion further.     Freeman Caldron, PA-C Pager: [phone] General Trauma PA Pager: [phone]   10/18/2013

## 2013-10-18 NOTE — Evaluation (Signed)
Occupational Therapy Evaluation Patient Details Name: Savannah Todd MRN: [id number] DOB: 1920/12/13 Today's Date: 10/18/2013 Time: 7829-5621 OT Time Calculation (min): 28 min  OT Assessment / Plan / Recommendation History of present illness Savannah Todd is a 79 y.o. female who was at her nursing home and fell twice yesterdayCT Abd with acute/subacute L2 and L1 compression fractures with fracture of posterior elements. Also with rib fractures.   Clinical Impression   Pt admitted with above fx and new TLSO brace and now has the deficits listed below.  Pt was fairly I at [facility] before this fall and would benefit from OT services to attempt to reach her baseline level of functioning so she can return there.    OT Assessment  Patient needs continued OT Services    Follow Up Recommendations  SNF    Barriers to Discharge      Equipment Recommendations  None recommended by OT    Recommendations for Other Services    Frequency  Min 2X/week    Precautions / Restrictions Precautions Precautions: Fall Precaution Comments: pt also with significant dementia Required Braces or Orthoses: Spinal Brace Spinal Brace: Other (comment) (not stated how to donn) Spinal Brace Comments: donned brace in sitting.  Pt tolerated well. Restrictions Weight Bearing Restrictions: No   Pertinent Vitals/Pain Pt could not rate pain but did groan when getting in and out of bed.    ADL  Eating/Feeding: Performed;Moderate assistance Where Assessed - Eating/Feeding: Bed level Grooming: Performed;Wash/dry hands;Wash/dry face;Brushing hair;Moderate assistance Where Assessed - Grooming: Supported sitting Upper Body Bathing: Simulated;Minimal assistance Where Assessed - Upper Body Bathing: Supported sitting Lower Body Bathing: Performed;Maximal assistance Where Assessed - Lower Body Bathing: Supported sit to stand Upper Body Dressing: Performed;Maximal assistance Where Assessed - Upper Body Dressing:  Supported sitting Lower Body Dressing: Simulated;+1 Total assistance Where Assessed - Lower Body Dressing: Supported sit to Pharmacist, hospital: Performed;Moderate assistance Toilet Transfer Method: Stand pivot Toilet Transfer Equipment: Bedside commode Toileting - Clothing Manipulation and Hygiene: Performed;+1 Total assistance Where Assessed - Toileting Clothing Manipulation and Hygiene: Sit to stand from 3-in-1 or toilet Equipment Used: Rolling walker Transfers/Ambulation Related to ADLs: Pt transferred to bsc with mod assist.  Pt having difficulty following commands for adls and mobility. ADL Comments: Pt needs max to total assist with adls due to TLSO brace and overall confusion on what to do for adls.  Spoke to SNF and they stated she was not  that impaired when she lived at [facility].    OT Diagnosis: Cognitive deficits;Generalized weakness;Acute pain  OT Problem List: Decreased strength;Decreased activity tolerance;Impaired balance (sitting and/or standing);Decreased cognition;Decreased safety awareness;Decreased knowledge of use of DME or AE;Decreased knowledge of precautions;Pain OT Treatment Interventions: Self-care/ADL training;Therapeutic activities;DME and/or AE instruction   OT Goals(Current goals can be found in the care plan section) Acute Rehab OT Goals Patient Stated Goal: none stated.  Pt unable to understand when goals were offered. OT Goal Formulation: Patient unable to participate in goal setting Time For Goal Achievement: 11/01/13 Potential to Achieve Goals: Fair ADL Goals Pt Will Perform Eating: with set-up;sitting Pt Will Perform Grooming: with supervision;standing Pt Will Perform Upper Body Dressing: with supervision;sitting (assist for brace only) Pt Will Perform Lower Body Dressing: with min assist;sit to/from stand Pt Will Transfer to Toilet: with min guard assist;ambulating  Visit Information  Last OT Received On: 10/18/13 Assistance Needed: +1 History  of Present Illness: Savannah Todd is a 79 y.o. female who was at her nursing home and  fell twice yesterdayCT Abd with acute/subacute L2 and L1 compression fractures with fracture of posterior elements. Also with rib fractures.       Prior Functioning     Home Living Family/patient expects to be discharged to:: Skilled nursing facility Prior Function Level of Independence: Needs assistance Gait / Transfers Assistance Needed: per pt: was walking with walker independently ADL's / Homemaking Assistance Needed: Per nursing home staff, pt needed assist to bathe in shower but could dress self, groom, feed self and toilet when asked to do so. No hands on assist needed for these tasks. Communication Communication: No difficulties         Vision/Perception Vision - Assessment Vision Assessment: Vision not tested Additional Comments: unable to test vision b/c of cognitive deficits. Perception Perception: Not tested Praxis Praxis: Not tested   Cognition  Cognition Arousal/Alertness: Awake/alert Behavior During Therapy: WFL for tasks assessed/performed Overall Cognitive Status: Impaired/Different from baseline Area of Impairment: Orientation;Attention;Memory;Following commands;Safety/judgement;Awareness;Problem solving Orientation Level: Disoriented to;Place;Time;Situation Current Attention Level: Focused Memory: Decreased short-term memory Following Commands: Follows one step commands inconsistently Safety/Judgement: Decreased awareness of safety;Decreased awareness of deficits Awareness: Intellectual Problem Solving: Slow processing;Decreased initiation;Difficulty sequencing;Requires verbal cues General Comments: Pt has baseline dementia but his appears to be more severe than her baseline.    Extremity/Trunk Assessment Upper Extremity Assessment Upper Extremity Assessment: Overall WFL for tasks assessed;Difficult to assess due to impaired cognition Lower Extremity Assessment Lower  Extremity Assessment: Defer to PT evaluation Cervical / Trunk Assessment Cervical / Trunk Assessment: Kyphotic     Mobility Bed Mobility Bed Mobility: Supine to Sit;Sit to Supine Supine to Sit: 3: Mod assist Sit to Supine: 2: Max assist Details for Bed Mobility Assistance: Pt required step by step cues for motor planning today.  Appear she may be worse cognitively than she was on admit. Transfers Transfers: Sit to Stand;Stand to Sit Sit to Stand: 3: Mod assist;From bed Stand to Sit: 3: Mod assist;To bed Details for Transfer Assistance: Pt held to OT arms to stand to get to [facility].     Exercise     Balance Balance Balance Assessed: Yes   End of Session OT - End of Session Activity Tolerance: Patient limited by fatigue Patient left: in bed;with call bell/phone within reach;with bed alarm set Nurse Communication: Mobility status;Other (comment) (Cognition worse than baseline)  GO     Hope Budds 10/18/2013, 12:16 PM [phone]

## 2013-10-18 NOTE — Progress Notes (Signed)
Re-check [employer] fitting brace. Patient examined and I agree with the assessment and plan  Violeta Gelinas, MD, MPH, FACS Pager: [phone]  10/18/2013 11:30 AM

## 2013-10-19 DIAGNOSIS — K5900 Constipation, unspecified: Secondary | ICD-10-CM

## 2013-10-19 MED ORDER — FLEET ENEMA 7-19 GM/118ML RE ENEM
1.0000 | ENEMA | Freq: Once | RECTAL | Status: AC
  Administered 2013-10-19: 1 via RECTAL
  Filled 2013-10-19: qty 1

## 2013-10-19 MED ORDER — DOCUSATE SODIUM 100 MG PO CAPS
200.0000 mg | ORAL_CAPSULE | Freq: Two times a day (BID) | ORAL | Status: DC
  Administered 2013-10-19 – 2013-10-22 (×5): 200 mg via ORAL
  Filled 2013-10-19 (×6): qty 2

## 2013-10-19 NOTE — Progress Notes (Signed)
Claims she had a BM after enema. Patient examined and I agree with the assessment and plan  Violeta Gelinas, MD, MPH, FACS Pager: [phone]  10/19/2013 2:53 PM

## 2013-10-19 NOTE — Progress Notes (Signed)
Seen and agreed 10/19/2013 [employer] [facility] Elizabeth PTA [phone] pager [phone] office    

## 2013-10-19 NOTE — Progress Notes (Signed)
Patient ID: Savannah Todd, female   DOB: Sep 20, 1920, 79 y.o.   MRN: [id number]   LOS: 4 days   Subjective: No c/o. Denies N/V.   Objective: Vital signs in last 24 hours: Temp:  [98 F (36.7 C)-98.9 F (37.2 C)] 98.9 F (37.2 C) (10/30 0510) Pulse Rate:  [64-80] 80 (10/30 0510) Resp:  [16-20] 20 (10/30 0510) BP: (152-173)/(67-95) 173/95 mmHg (10/30 0510) SpO2:  [93 %-98 %] 96 % (10/30 0845) Last BM Date: 10/15/13   Physical Exam General appearance: alert and no distress Resp: clear to auscultation bilaterally Cardio: regular rate and rhythm GI: Soft, +BS, mild TTP suprapubic   Assessment/Plan: Fall  Right sided rib fractures/PTX -- Pulmonary toilet  L1/L2 compression fxs of uncertain chronicity -- LSO brace  UTI -- Resolved ABL anemia - mild, stable  Chronic medical problems- Home meds  VTE - SCD's, Lovenox, mobilize  FEN - Abdominal x-ray indicative of constipation, will give enema Dispo -- Constipation    Freeman Caldron, PA-C Pager: [phone] General Trauma PA Pager: [phone]   10/19/2013

## 2013-10-20 ENCOUNTER — Inpatient Hospital Stay (HOSPITAL_COMMUNITY): Payer: Medicare Other

## 2013-10-20 DIAGNOSIS — R4182 Altered mental status, unspecified: Secondary | ICD-10-CM

## 2013-10-20 DIAGNOSIS — T1490XA Injury, unspecified, initial encounter: Secondary | ICD-10-CM

## 2013-10-20 DIAGNOSIS — K589 Irritable bowel syndrome without diarrhea: Secondary | ICD-10-CM

## 2013-10-20 DIAGNOSIS — S270XXA Traumatic pneumothorax, initial encounter: Secondary | ICD-10-CM

## 2013-10-20 DIAGNOSIS — E876 Hypokalemia: Secondary | ICD-10-CM

## 2013-10-20 LAB — CBC
HCT: 32 % — ABNORMAL LOW (ref 36.0–46.0)
MCH: 30.3 pg (ref 26.0–34.0)
MCHC: 36.9 g/dL — ABNORMAL HIGH (ref 30.0–36.0)
MCV: 82.1 fL (ref 78.0–100.0)
RBC: 3.9 MIL/uL (ref 3.87–5.11)
RDW: 11.9 % (ref 11.5–15.5)

## 2013-10-20 LAB — BASIC METABOLIC PANEL
BUN: 23 mg/dL (ref 6–23)
BUN: 23 mg/dL (ref 6–23)
CO2: 33 mEq/L — ABNORMAL HIGH (ref 19–32)
Calcium: 8.6 mg/dL (ref 8.4–10.5)
Chloride: 71 mEq/L — ABNORMAL LOW (ref 96–112)
Creatinine, Ser: 0.49 mg/dL — ABNORMAL LOW (ref 0.50–1.10)
Creatinine, Ser: 0.41 mg/dL — ABNORMAL LOW (ref 0.50–1.10)
GFR calc Af Amer: >90 mL/min (ref >90)
GFR calc non Af Amer: 83 mL/min — ABNORMAL LOW (ref >90)
Glucose, Bld: 140 mg/dL — ABNORMAL HIGH (ref 70–99)
Glucose, Bld: 114 mg/dL — ABNORMAL HIGH (ref 70–99)
Sodium: 114 mEq/L — CL (ref 135–145)

## 2013-10-20 LAB — VITAMIN B12: Vitamin B-12: 992 pg/mL — ABNORMAL HIGH (ref 211–911)

## 2013-10-20 LAB — MAGNESIUM: Magnesium: 1.9 mg/dL (ref 1.5–2.5)

## 2013-10-20 MED ORDER — POTASSIUM CHLORIDE 10 MEQ/100ML IV SOLN
10.0000 meq | INTRAVENOUS | Status: AC
  Administered 2013-10-20 (×4): 10 meq via INTRAVENOUS
  Filled 2013-10-20 (×4): qty 100

## 2013-10-20 MED ORDER — POTASSIUM CHLORIDE 10 MEQ/100ML IV SOLN
10.0000 meq | INTRAVENOUS | Status: AC
  Administered 2013-10-20 – 2013-10-21 (×2): 10 meq via INTRAVENOUS
  Filled 2013-10-20 (×2): qty 100

## 2013-10-20 MED ORDER — SODIUM CHLORIDE 1 G PO TABS
1.0000 g | ORAL_TABLET | Freq: Three times a day (TID) | ORAL | Status: DC
  Administered 2013-10-20: 1 g via ORAL
  Filled 2013-10-20 (×5): qty 1

## 2013-10-20 MED ORDER — SODIUM CHLORIDE 0.9 % IV SOLN
INTRAVENOUS | Status: DC
  Administered 2013-10-20: 22:00:00 via INTRAVENOUS

## 2013-10-20 NOTE — Progress Notes (Signed)
Pt's K+ and Na still in critical level; MD on call paged and notified; order for an IV fluid given. Continue runs of KCl still 3 bags remaining. Will continue to monitor.

## 2013-10-20 NOTE — Progress Notes (Signed)
LOS: 5 days   Subjective: Patient is resting in bed. She seems confused and mumbles incoherent words in response to questions. She eventually is able to say her name. Otherwise she is not oriented to time or place. She says she has had a "little bit" of a bowel movement yesterday. No other complaints. Denies shortness of breath or pain.  Her son is at the bedside and is concerned about her decline in mental status over the past couple days. Also spoke with another son on phone who is a radiologist in the area about work-up for confusion. Patient's family is not comfortable with discharging patient anytime soon due to mental status.   Objective: Vital signs in last 24 hours: Temp:  [97.3 F (36.3 C)-97.8 F (36.6 C)] 97.3 F (36.3 C) (10/31 0524) Pulse Rate:  [77-82] 82 (10/31 0524) Resp:  [15-16] 16 (10/31 0524) BP: (147-171)/(57-87) 153/87 mmHg (10/31 0524) SpO2:  [93 %-96 %] 93 % (10/31 0524) Last BM Date: 10/19/13   Laboratory  CBC No results found for this basename: WBC, HGB, HCT, PLT,  in the last 72 hours BMET No results found for this basename: NA, K, CL, CO2, GLUCOSE, BUN, CREATININE, CALCIUM,  in the last 72 hours   Physical Exam General appearance: Confused female resting in bed in no apparent distress Head: Normocephalic, without obvious abnormality, atraumatic Eyes: pupils minimally reactive to dilate Resp: rhonchi on right  Cardio: III/IV systolic murmur heard best at right sternal border; normal rate GI: mildly distended abdomen with tenderness to palpation of suprapubic and LLQ. positive bowel sounds.  Extremities: no edema present Pulses: 2+ and symmetric Neurologic: oriented to person only   Assessment/Plan: Fall Right side rib fractures/ptx- pulmonary toilet; recheck CXR Acute mental status change: CT head on 10/29 revealed no acute abnormalities; chronic atrophy and ischemic white matter disease. Recheck CXR, CBC, and BMP.  UTI-resolved Constipation: Abd  plain films on 10/29 showed constipation. Continue enema. Continue bowel regimen ABL Anemia: recheck CBC VTE: SCDs, lovenox FEN: recheck BMP; continue regular diet.  Dispo: remain inpatient; PT/OT therapies.   Ordered CBC, BMP, and CXR. If there are no findings supporting recent confusion, will consult with neurology.     Maris Berger Pager: [phone] General Trauma PA Pager: [phone]   10/20/2013

## 2013-10-20 NOTE — Progress Notes (Signed)
Physical Therapy Treatment Patient Details Name: Savannah Todd MRN: [id number] DOB: 08/30/1920 Today's Date: 10/20/2013 Time: 7829-5621 PT Time Calculation (min): 27 min  PT Assessment / Plan / Recommendation  History of Present Illness Savannah Todd is a 79 y.o. female who was at her nursing home and fell twice yesterdayCT Abd with acute/subacute L2 and L1 compression fractures with fracture of posterior elements. Also with rib fractures.   PT Comments   Pt received incontinent of urine and stool. Assisted patient to bedside commode. Patient +BM and Urine again. Performed hygiene and multiple standing trials. Patient unable to tolerate ambulation. Assisted patient to chair. Family and nsg tech present.  Follow Up Recommendations  SNF     Does the patient have the potential to tolerate intense rehabilitation     Barriers to Discharge        Equipment Recommendations  Rolling walker with 5" wheels    Recommendations for Other Services    Frequency Min 3X/week   Progress towards PT Goals Progress towards PT goals: Not progressing toward goals - comment  Plan Current plan remains appropriate    Precautions / Restrictions Precautions Precautions: Fall Precaution Comments: pt also with significant dementia Required Braces or Orthoses: Spinal Brace Spinal Brace: Lumbar corset;Applied in sitting position Spinal Brace Comments: donned brace in sitting.  Pt tolerated well. Restrictions Weight Bearing Restrictions: No   Pertinent Vitals/Pain No pain value given    Mobility  Bed Mobility Bed Mobility: Rolling Right;Right Sidelying to Sit;Sitting - Scoot to [place] of Bed Rolling Right: 2: Max assist Right Sidelying to Sit: 2: Max assist Supine to Sit: 3: Mod assist Sitting - Scoot to Edge of Bed: 2: Max assist Sit to Supine: 2: Max assist Details for Bed Mobility Assistance: Pt required step by step cues for motor planning today.  Appear she may be worse cognitively than she was on  admit.  Pt would verbalize understanding of commands but would not initiate bed mobility tasks. Transfers Transfers: Sit to Stand;Stand to [employer] Transfers Sit to Stand: 3: Mod assist Stand to Sit: 3: Mod assist Stand Pivot Transfers: 3: Mod assist Details for Transfer Assistance: VCs for hand placement and proper positioning. Performed sit to stand x 4 for hygiene purposes Ambulation/Gait Ambulation/Gait Assistance: Not tested (comment) Wheelchair Mobility Wheelchair Mobility: No      PT Goals (current goals can now be found in the care plan section) Acute Rehab PT Goals Patient Stated Goal: none stated.  Pt unable to understand when goals were offered. PT Goal Formulation: With patient Time For Goal Achievement: 10/23/13 Potential to Achieve Goals: Good  Visit Information  Last PT Received On: 10/20/13 Assistance Needed: +2 (For safety and ambulation) History of Present Illness: Savannah Todd is a 79 y.o. female who was at her nursing home and fell twice yesterdayCT Abd with acute/subacute L2 and L1 compression fractures with fracture of posterior elements. Also with rib fractures.    Subjective Data  Subjective: I have to use the bathroom Patient Stated Goal: none stated.  Pt unable to understand when goals were offered.   Cognition  Cognition Arousal/Alertness: Awake/alert Behavior During Therapy: WFL for tasks assessed/performed Overall Cognitive Status: History of cognitive impairments - at baseline Area of Impairment: Orientation;Attention;Memory;Following commands;Safety/judgement;Awareness;Problem solving Orientation Level: Disoriented to;Place;Time;Situation Current Attention Level: Focused Memory: Decreased short-term memory Following Commands: Follows one step commands inconsistently Safety/Judgement: Decreased awareness of safety;Decreased awareness of deficits Awareness: Intellectual Problem Solving: Slow processing;Decreased initiation;Difficulty  sequencing;Requires verbal cues General  Comments: Pt has baseline dementia but his appears to be more severe than her baseline.    Balance  Balance Balance Assessed: Yes  End of Session PT - End of Session Equipment Utilized During Treatment: Gait belt Activity Tolerance: Other (comment) (limited by need for hygiene) Patient left: in chair;with call bell/phone within reach;with family/visitor present Nurse Communication: Mobility status   GP     Fabio Asa 10/20/2013, 3:46 PM Charlotte Crumb, PT DPT  [phone]

## 2013-10-20 NOTE — Progress Notes (Signed)
CRITICAL VALUE ALERT  Critical value received:  Na - 113 ans K+ - 2.7  Date of notification:  10/20/2013  Time of notification:  2130  Critical value read back:yes  Nurse who received alert:  Geannie Risen  MD notified (1st page):  Dr. Lindie Spruce  Time of first page:  2135  MD notified (2nd page):  Time of second page:  Responding MD:  Dr. Lindie Spruce  Time MD responded:  2140

## 2013-10-20 NOTE — Clinical Social Work Note (Signed)
Clinical Social Worker continuing to follow patient and family for support and placement needs.  Patient sons are aware and agreeable to patient return to [facility] once medically stable.  Facility is in agreement with patient return pending bed availability once patient is deemed medically stable.  CSW will continue to follow for support and to facilitate patient discharge needs once medically ready.  Macario Golds, [place] [phone]

## 2013-10-20 NOTE — Consult Note (Addendum)
Neurology Consultation Reason for Consult: Confusion Referring Physician: Jimmye Norman  CC: Confusion  History is obtained from: Son, patient  HPI: Savannah Todd is a 79 y.o. female with a history of declining memory problems over the past year who presented after a fall pneumothorax L2 compression fracture. She has had a decline in her mental status significantly since being admitted and due to concern for this neurology has been consulted. She has not had any evidence of seizure activity, no period of loss of consciousness. She has not had any clear evidence of stroke, but rather gradual decline in mental status.   ROS: A 14 point ROS was performed and is negative except as noted in the HPI.  Past Medical History  Diagnosis Date  . Thyroid disease     hypothyroidism  . Hypertension   . IBS (irritable bowel syndrome)   . Aortic stenosis   . Memory disorder     mild  . Hyperlipidemia   . GERD (gastroesophageal reflux disease)   . Ulcerative colitis   . Arthritis   . Anxiety     Family History: Father-heart disease  Social History: Tob: Never smoker  Exam: Current vital signs: BP 150/81  Pulse 84  Temp(Src) 97.6 F (36.4 C) (Axillary)  Resp 18  Ht 5\' 1"  (1.549 m)  Wt 65.545 kg (144 lb 8 oz)  BMI 27.32 kg/m2  SpO2 96% Vital signs in last 24 hours: Temp:  [97.3 F (36.3 C)-97.6 F (36.4 C)] 97.6 F (36.4 C) (10/31 1336) Pulse Rate:  [77-84] 84 (10/31 1336) Resp:  [16-18] 18 (10/31 1336) BP: (147-153)/(57-87) 150/81 mmHg (10/31 1336) SpO2:  [93 %-96 %] 96 % (10/31 1336)  General: In bed, NAD CV: Regular rate and rhythm Mental Status: Patient is awake, alert, states years 1982, answers "the place I always go" when asked where she is She appears to have significant problems with her short-term memory recalling recent events. She does make some occasional paraphasic errors. She has signs of concrete thinking She refuses to attempt to spell world forward or  backward Cranial Nerves: II: Visual Fields are full. Pupils are equal, round, and reactive to light.  Discs are difficult to visualize. III,IV, VI: EOMI without ptosis or diploplia.  V: Facial sensation is symmetric to temperature VII: Facial movement is symmetric.  VIII: hearing is intact to voice X: Uvula elevates symmetrically XI: Shoulder shrug is symmetric. XII: tongue is midline without atrophy or fasciculations.  Motor: Tone is normal. Bulk is normal. 5/5 strength was present in all four extremities.  Sensory: Sensation is symmetric to light touch and temperature in the arms and legs. Deep Tendon Reflexes: 2+ and symmetric in the biceps and patellae.  Plantars: Toes are downgoing bilaterally.  Cerebellar: FNF with mild tremor bilaterally Gait: Not tested due to patient safety concerns   I have reviewed labs in epic and the results pertinent to this consultation are: BMP 132 from 10/27 Today her sodium has fallen to 114  I have reviewed the images obtained: CT head-negative MRI brain is negative  Impression: 79 year old female with increasing confusion in the setting of hospitalization. I certainly think that her drop in sodium could be related to her worsening mental status. This does seem to be being addressed by the trauma team (urine osmolality ordered), may be worthwhile to have internal medicine way in on hyponatremia  Recommendations: 1) would consider internal medicine consult for hyponatremia 2) no further workup needed from confusion standpoint at this time.  Roland Rack, MD [facility] [phone]  If 7pm- 7am, please page neurology on call at [phone].

## 2013-10-20 NOTE — Progress Notes (Signed)
Not sure shy the patient is more confused.  Will check some labs and have neurology see the patient.  This patient has been seen and I agree with the findings and treatment plan.  Marta Lamas. Gae Bon, MD, FACS [phone] (pager) [phone] (direct pager) Trauma Surgeon

## 2013-10-20 NOTE — Progress Notes (Signed)
CRITICAL VALUE ALERT  Critical value received:  Na+ - 114 and K+ - 2.6  Date of notification:  10/20/2013  Time of notification:  1507  Critical value read back: yes  Nurse who received alert: Jacqualine Code, RN  MD notified (1st page):  Charma Igo, PA  Time of first page: 1509  MD notified (2nd page):  Time of second page:  Responding MD:  Charma Igo, PA   Time MD responded:  [phone]

## 2013-10-21 ENCOUNTER — Inpatient Hospital Stay (HOSPITAL_COMMUNITY): Payer: Medicare Other

## 2013-10-21 DIAGNOSIS — E871 Hypo-osmolality and hyponatremia: Secondary | ICD-10-CM

## 2013-10-21 DIAGNOSIS — E876 Hypokalemia: Secondary | ICD-10-CM

## 2013-10-21 LAB — CBC
HCT: 30.8 % — ABNORMAL LOW (ref 36.0–46.0)
Hemoglobin: 11.2 g/dL — ABNORMAL LOW (ref 12.0–15.0)
MCH: 29.5 pg (ref 26.0–34.0)
MCV: 81.1 fL (ref 78.0–100.0)
Platelets: 283 10*3/uL (ref 150–400)
RBC: 3.8 MIL/uL — ABNORMAL LOW (ref 3.87–5.11)
RDW: 11.8 % (ref 11.5–15.5)
WBC: 13.9 10*3/uL — ABNORMAL HIGH (ref 4.0–10.5)

## 2013-10-21 LAB — OSMOLALITY: Osmolality: 237 mOsm/kg — ABNORMAL LOW (ref 275–300)

## 2013-10-21 LAB — BASIC METABOLIC PANEL
CO2: 33 mEq/L — ABNORMAL HIGH (ref 19–32)
Chloride: 76 mEq/L — ABNORMAL LOW (ref 96–112)
Creatinine, Ser: 0.42 mg/dL — ABNORMAL LOW (ref 0.50–1.10)
GFR calc Af Amer: >90 mL/min (ref >90)
Glucose, Bld: 112 mg/dL — ABNORMAL HIGH (ref 70–99)

## 2013-10-21 MED ORDER — SODIUM CHLORIDE 1 G PO TABS
1.0000 g | ORAL_TABLET | Freq: Four times a day (QID) | ORAL | Status: DC
  Administered 2013-10-21 – 2013-10-22 (×4): 1 g via ORAL
  Filled 2013-10-21 (×8): qty 1

## 2013-10-21 MED ORDER — POTASSIUM CHLORIDE IN NACL 20-0.9 MEQ/L-% IV SOLN
INTRAVENOUS | Status: DC
  Administered 2013-10-21: 50 mL/h via INTRAVENOUS
  Filled 2013-10-21 (×2): qty 1000

## 2013-10-21 NOTE — Progress Notes (Signed)
Pt's serum osmolality 237; Dr. Lindie Spruce paged and notified of critical value; no new orders given. Will continue to monitor.

## 2013-10-21 NOTE — Progress Notes (Signed)
Pt's Na level 116; MD on call - Dr. Lindie Spruce, paged and notified; new order given. Will continue to monitor.

## 2013-10-21 NOTE — Progress Notes (Signed)
CRITICAL VALUE ALERT  Critical value received:  Serum osmolality - 237  Date of notification:  10/21/2013  Time of notification:  0315  Critical value read back:yes  Nurse who received alert:  Geannie Risen  MD notified (1st page):  Dr. Lindie Spruce  Time of first page:  0320  MD notified (2nd page):  Time of second page:  Responding MD:  Dr. Lindie Spruce  Time MD responded:  [phone]

## 2013-10-21 NOTE — Progress Notes (Signed)
<principal problem not specified>  Assessment: Still confused, hyponatremic and hypokalemic, but improved from yesterday  Plan: Has started Na+ replacement with oral salt tabs. Will add K+ to IVF and give a few more runs IV; will recheck labs in AM   Subjective: Feels like she is confused, doesn't feel right, c/o some rib pain on right  Objective: Vital signs in last 24 hours: Temp:  [97.6 F (36.4 C)-97.9 F (36.6 C)] 97.9 F (36.6 C) (11/01 0553) Pulse Rate:  [76-84] 76 (11/01 0553) Resp:  [16-18] 16 (11/01 0553) BP: (150-179)/(71-81) 179/79 mmHg (11/01 0553) SpO2:  [93 %-97 %] 96 % (11/01 0957) Last BM Date: 10/20/13  Intake/Output from previous day:    General appearance: alert, cooperative, no distress and still a little confused Resp: clear to auscultation bilaterally GI: soft, non-tender; bowel sounds normal; no masses,  no organomegaly  Lab Results:  Results for orders placed during the hospital encounter of 10/15/13 (from the past 24 hour(s))  BASIC METABOLIC PANEL     Status: Abnormal   Collection Time    10/20/13 12:16 PM      Result Value Range   Sodium 114 (*) 135 - 145 mEq/L   Potassium 2.6 (*) 3.5 - 5.1 mEq/L   Chloride 71 (*) 96 - 112 mEq/L   CO2 32  19 - 32 mEq/L   Glucose, Bld 140 (*) 70 - 99 mg/dL   BUN 23  6 - 23 mg/dL   Creatinine, Ser 9.60 (*) 0.50 - 1.10 mg/dL   Calcium 8.8  8.4 - 45.4 mg/dL   GFR calc non Af Amer 83 (*) >90 mL/min   GFR calc Af Amer >90  >90 mL/min  CBC     Status: Abnormal   Collection Time    10/20/13 12:16 PM      Result Value Range   WBC 13.5 (*) 4.0 - 10.5 K/uL   RBC 3.90  3.87 - 5.11 MIL/uL   Hemoglobin 11.8 (*) 12.0 - 15.0 g/dL   HCT 09.8 (*) 11.9 - 14.7 %   MCV 82.1  78.0 - 100.0 fL   MCH 30.3  26.0 - 34.0 pg   MCHC 36.9 (*) 30.0 - 36.0 g/dL   RDW 82.9  56.2 - 13.0 %   Platelets 284  150 - 400 K/uL  TSH     Status: Abnormal   Collection Time    10/20/13  4:50 PM      Result Value Range   TSH 7.244 (*)  0.350 - 4.500 uIU/mL  VITAMIN B12     Status: Abnormal   Collection Time    10/20/13  4:50 PM      Result Value Range   Vitamin B-12 992 (*) 211 - 911 pg/mL  OSMOLALITY, URINE     Status: None   Collection Time    10/20/13  6:20 PM      Result Value Range   Osmolality, Ur 582  390 - 1090 mOsm/kg  BASIC METABOLIC PANEL     Status: Abnormal   Collection Time    10/20/13  8:00 PM      Result Value Range   Sodium 113 (*) 135 - 145 mEq/L   Potassium 2.7 (*) 3.5 - 5.1 mEq/L   Chloride 73 (*) 96 - 112 mEq/L   CO2 33 (*) 19 - 32 mEq/L   Glucose, Bld 114 (*) 70 - 99 mg/dL   BUN 23  6 - 23 mg/dL   Creatinine, Ser 8.65 (*)  0.50 - 1.10 mg/dL   Calcium 8.6  8.4 - 16.1 mg/dL   GFR calc non Af Amer 88 (*) >90 mL/min   GFR calc Af Amer >90  >90 mL/min  MAGNESIUM     Status: None   Collection Time    10/20/13  8:00 PM      Result Value Range   Magnesium 1.9  1.5 - 2.5 mg/dL  OSMOLALITY     Status: Abnormal   Collection Time    10/20/13  8:30 PM      Result Value Range   Osmolality 237 (*) 275 - 300 mOsm/kg  CBC     Status: Abnormal   Collection Time    10/21/13  5:05 AM      Result Value Range   WBC 13.9 (*) 4.0 - 10.5 K/uL   RBC 3.80 (*) 3.87 - 5.11 MIL/uL   Hemoglobin 11.2 (*) 12.0 - 15.0 g/dL   HCT 09.6 (*) 04.5 - 40.9 %   MCV 81.1  78.0 - 100.0 fL   MCH 29.5  26.0 - 34.0 pg   MCHC 36.4 (*) 30.0 - 36.0 g/dL   RDW 81.1  91.4 - 78.2 %   Platelets 283  150 - 400 K/uL  BASIC METABOLIC PANEL     Status: Abnormal   Collection Time    10/21/13  5:05 AM      Result Value Range   Sodium 116 (*) 135 - 145 mEq/L   Potassium 3.1 (*) 3.5 - 5.1 mEq/L   Chloride 76 (*) 96 - 112 mEq/L   CO2 33 (*) 19 - 32 mEq/L   Glucose, Bld 112 (*) 70 - 99 mg/dL   BUN 19  6 - 23 mg/dL   Creatinine, Ser 9.56 (*) 0.50 - 1.10 mg/dL   Calcium 8.3 (*) 8.4 - 10.5 mg/dL   GFR calc non Af Amer 87 (*) >90 mL/min   GFR calc Af Amer >90  >90 mL/min     Studies/Results Radiology     MEDS, Scheduled .  acetaminophen  650 mg Oral Q8H  . amLODipine  5 mg Oral Daily  . balsalazide  1,500 mg Oral TID  . digoxin  125 mcg Oral Daily  . docusate sodium  200 mg Oral BID  . enoxaparin (LOVENOX) injection  40 mg Subcutaneous Q24H  . feeding supplement (RESOURCE BREEZE)  1 Container Oral TID BM  . hydrochlorothiazide  25 mg Oral Daily  . Ipratropium-Albuterol  1 puff Inhalation QID  . levothyroxine  125 mcg Oral QAC breakfast  . polyethylene glycol  17 g Oral Daily  . simvastatin  10 mg Oral QHS  . sodium chloride  1 g Oral QID  . traMADol  100 mg Oral Q6H       LOS: 6 days    Currie Paris, MD, [facility], [place] [phone]   10/21/2013 11:30 AM

## 2013-10-21 NOTE — Progress Notes (Signed)
CRITICAL VALUE ALERT  Critical value received:  Na - 116  Date of notification:  10/21/2013  Time of notification:  0650  Critical value read back:yes  Nurse who received alert:  Geannie Risen, RN  MD notified (1st page):  Dr. Lindie Spruce  Time of first page:  [phone]  MD notified (2nd page):  Time of second page:  Responding MD:  Dr. Lindie Spruce  Time MD responded:  0700

## 2013-10-22 ENCOUNTER — Inpatient Hospital Stay (HOSPITAL_COMMUNITY): Payer: Medicare Other

## 2013-10-22 DIAGNOSIS — S2239XA Fracture of one rib, unspecified side, initial encounter for closed fracture: Secondary | ICD-10-CM

## 2013-10-22 DIAGNOSIS — E876 Hypokalemia: Secondary | ICD-10-CM | POA: Diagnosis present

## 2013-10-22 DIAGNOSIS — E871 Hypo-osmolality and hyponatremia: Secondary | ICD-10-CM

## 2013-10-22 LAB — BASIC METABOLIC PANEL
BUN: 16 mg/dL (ref 6–23)
CO2: 31 mEq/L (ref 19–32)
Calcium: 8.5 mg/dL (ref 8.4–10.5)
Calcium: 8.1 mg/dL — ABNORMAL LOW (ref 8.4–10.5)
Chloride: 74 mEq/L — ABNORMAL LOW (ref 96–112)
Chloride: 75 mEq/L — ABNORMAL LOW (ref 96–112)
Creatinine, Ser: 0.43 mg/dL — ABNORMAL LOW (ref 0.50–1.10)
Creatinine, Ser: 0.43 mg/dL — ABNORMAL LOW (ref 0.50–1.10)
GFR calc Af Amer: >90 mL/min (ref >90)
GFR calc Af Amer: >90 mL/min (ref >90)
GFR calc Af Amer: >90 mL/min (ref >90)
GFR calc non Af Amer: 86 mL/min — ABNORMAL LOW (ref >90)
GFR calc non Af Amer: 86 mL/min — ABNORMAL LOW (ref >90)
GFR calc non Af Amer: 87 mL/min — ABNORMAL LOW (ref >90)
Potassium: 3.2 mEq/L — ABNORMAL LOW (ref 3.5–5.1)
Potassium: 3.5 mEq/L (ref 3.5–5.1)
Sodium: 112 mEq/L — CL (ref 135–145)
Sodium: 113 mEq/L — CL (ref 135–145)

## 2013-10-22 LAB — OSMOLALITY, URINE: Osmolality, Ur: 539 mOsm/kg (ref 390–1090)

## 2013-10-22 LAB — URIC ACID: Uric Acid, Serum: 1.8 mg/dL — ABNORMAL LOW (ref 2.4–7.0)

## 2013-10-22 LAB — NA AND K (SODIUM & POTASSIUM), RAND UR: Sodium, Ur: 59 mEq/L

## 2013-10-22 LAB — CHLORIDE, URINE, RANDOM: Chloride Urine: 92 mEq/L

## 2013-10-22 LAB — OSMOLALITY: Osmolality: 240 mOsm/kg — CL (ref 275–300)

## 2013-10-22 MED ORDER — SODIUM CHLORIDE 3 % IV SOLN
INTRAVENOUS | Status: DC
  Filled 2013-10-22: qty 500

## 2013-10-22 MED ORDER — DESMOPRESSIN ACETATE 4 MCG/ML IJ SOLN
1.0000 ug | Freq: Three times a day (TID) | INTRAMUSCULAR | Status: DC
  Administered 2013-10-23: 1 ug via SUBCUTANEOUS
  Filled 2013-10-22 (×6): qty 0.25

## 2013-10-22 MED ORDER — POTASSIUM CHLORIDE 20 MEQ/15ML (10%) PO LIQD
40.0000 meq | Freq: Once | ORAL | Status: AC
  Administered 2013-10-22: 40 meq via ORAL
  Filled 2013-10-22: qty 30

## 2013-10-22 MED ORDER — SODIUM CHLORIDE 3 % IV SOLN
INTRAVENOUS | Status: DC
  Administered 2013-10-24: 15:00:00 via INTRAVENOUS
  Filled 2013-10-22 (×4): qty 500

## 2013-10-22 MED ORDER — SODIUM CHLORIDE 0.9 % IV SOLN
INTRAVENOUS | Status: DC
  Administered 2013-10-22: 1000 mL via INTRAVENOUS

## 2013-10-22 MED ORDER — SODIUM CHLORIDE 3 % IV SOLN: INTRAVENOUS | Status: DC

## 2013-10-22 MED ORDER — SODIUM CHLORIDE 3 % IV SOLN
Freq: Once | INTRAVENOUS | Status: AC
  Administered 2013-10-22: 22:00:00 via INTRAVENOUS
  Filled 2013-10-22: qty 500

## 2013-10-22 MED ORDER — SODIUM CHLORIDE 3 % IV SOLN
Freq: Once | INTRAVENOUS | Status: DC
  Filled 2013-10-22: qty 500

## 2013-10-22 MED ORDER — SODIUM CHLORIDE 0.9 % IJ SOLN
10.0000 mL | INTRAMUSCULAR | Status: DC | PRN
  Administered 2013-10-22: 30 mL
  Administered 2013-10-23: 20 mL
  Administered 2013-10-25: 30 mL

## 2013-10-22 MED ORDER — SODIUM CHLORIDE 1 G PO TABS
1.0000 g | ORAL_TABLET | Freq: Four times a day (QID) | ORAL | Status: DC
  Administered 2013-10-22 – 2013-10-24 (×11): 1 g via ORAL
  Filled 2013-10-22 (×15): qty 1

## 2013-10-22 NOTE — Progress Notes (Signed)
CRITICAL VALUE ALERT  Critical value received:  Sodium 112  Date of notification:  10/22/13  Time of notification:  0700  Critical value read back:yes  Nurse who received alert:  Lorenda Peck RN  MD notified (1st page):  Dr. Lindie Spruce  Time of first page:  0720  MD notified (2nd page):  Time of second page:  Responding MD:    Time MD responded:

## 2013-10-22 NOTE — Consult Note (Addendum)
[facility] Medical Consultation  Savannah Todd NGE:[id number] DOB: 01/01/1920 DOA: 10/15/2013 PCP: Gaspar Bidding, DO   Requesting physician: Jamey Ripa Date of consultation: 10/22/13 Reason for consultation: Hyponatremia  Impression/Recommendations Principal Problem:   Multiple fractures of ribs of right side Active Problems:   Fall   Pneumothorax, traumatic   Compression fracture of L1 lumbar vertebra   Compression fracture of L2   Acute blood loss anemia   Hypokalemia   Hyponatremia  1. Chronic Severe Hyponatremia with moderate symptoms: Unclear etiology at this time.  She had a mildly elevated TSH (7) on 10/31, a low osmolarity and high urine osmolarity.  Etiology could include volume depletion, diuretics, CHF, cirrhosis, ARF, SIADH, endocrine disorders, primary polydipsia, low solute intake. She is mildly edematous today, however, she has been noted to have no edema from previous notes; possibly this is from fluid repletion.  She appears euvolemic, possibly mildly volume overloaded.  She has no history of CHF, renal failure or cirrhosis. Possibly an endocrine disorder vs. Low solute intake post injury.  SIADH is a further possibility.  She is on a diuretic, HCTZ.  - Chronic due to being prolonged > 48 hours, severe due to being < 120 - Will recheck serum osmolarity, urine osmolarity and urine Na, K, Cl today prior to therapy - Check AM cortisol and ACTH to evaluate for adrenal insufficiency - STAT BMET now - Would recommend starting hypertonic saline with 50cc bolus followed by infusion of 15cc/hr - In addition will recommend starting desmopressin at SQ every 8 hours - Spoke with pharmacy, and it is a strong recommendation that the hypertonic saline be infused through a central line (PICC is appropriate) - She will need BMETs every 4 hours while on hypertonic saline.  It is vitally important that these be timely.  - Correction of Na should be no more than 4-6 mEq/L in 24  hours, with goal being no more than in 24 hours.  If the rate should exceed this, hypertonic saline should be stopped  2. Hypokalemia - Would replace IV as needed, would recommend multi lumen PICC if available to be able to run both KCl and saline  3. Multiple rib fractures/fall - Treatment per primary team.   [facility] will followup again tomorrow. Please contact me if I can be of assistance in the meanwhile. Thank you for this consultation.  Chief Complaint: Lethargy/Confusion  HPI:  Savannah Todd is a 79yo woman with PMH of AS, Dementia, IBS, hypothyroidism, HLD who lives in [facility] who presented on 10/26 after a fall.  During the fall she suffered right rib fractures and a small right sided pneumothorax.  She has been managed on the Trauma Service.  Her Na on day of admission was 133, followed by 132 on 10/27.  Per notes on 10/27 she did start to have some confusion at that time.  On 10/29 she was noted to have 2 episodes of emesis, but was not nauseated afterwards.  She continued to be confused.  And then on 10/31 she was more confused, as noted by her son.  At that time a Na level was drawn and was 10/31, her serum osm was 237 and urine osm was 582.  At that time her fluids were switched from 1/2NS with of KCL to NS for about 12 hours.  She was also started on Na tabletsOn 11/1, her MS cleared somewhat and her Na was 116.  KCL was added to her fluids.  Today, the hospitalist service was called for consult as her Na is 112.   Today on my exam, Savannah Todd is lethargic and confused.  She has some slowed breathing.  She did open her eyes to voice and mumbled a few words.  This history was mainly obtained through chart review and discussion with primary team.    Per Chart review, she does not have any history of CHF, kidney disease or liver disease.  On labs, she does not have renal failure, last LFTs in July of this year were WNL.    Review of Systems:   Unable to be obtained 2/2 patients mental status, lethargy  Past Medical History  Diagnosis Date  . Thyroid disease     hypothyroidism  . Hypertension   . IBS (irritable bowel syndrome)   . Aortic stenosis   . Memory disorder     mild  . Hyperlipidemia   . GERD (gastroesophageal reflux disease)   . Ulcerative colitis   . Arthritis   . Anxiety    Past Surgical History  Procedure Laterality Date  . Esophagogastroduodenoscopy  01/30/1993    with biopsy/second duodenum normal/mucosa normal/no ulcerations or any inflammatory activity/no evidence of extrinsic compression or active inflammation/fundus & cardia normal/2 to 3cm hiatal hernia/proximal esophagus normal  . Esophagogastroduodenoscopy  07/18/1991    ampulla normal/pyloric channel,prepyloric area, antrum & body normal/fundus & cardia normal/distal & proximal esophagus normal/  . Abdominal hysterectomy     Social History:  reports that she has never smoked. She does not have any smokeless tobacco history on file. She reports that she does not drink alcohol or use illicit drugs.  Allergies  Allergen Reactions  . Niaspan [Niacin Er] Itching  . Bactrim Nausea Only  . Benazepril Hcl Cough  . Naprosyn [Naproxen] Itching   Family History  Problem Relation Age of Onset  . Heart disease    . Cancer Mother   . Heart disease Father     Prior to Admission medications   Medication Sig Start Date End Date Taking? Authorizing Provider  acetaminophen (TYLENOL) 650 MG CR tablet Take 650 mg by mouth every 8 (eight) hours.   Yes Historical Provider, MD  amLODipine (NORVASC) 5 MG tablet Take 5 mg by mouth daily.   Yes Historical Provider, MD  balsalazide (COLAZAL) 750 MG capsule Take 2 capsules (1,500 mg total) by mouth 3 (three) times daily. 3 tablets TID 08/09/13  Yes Josalyn C Funches, MD  BREWERS YEAST PO Take 2 tablets by mouth 3 (three) times daily.     Yes Historical Provider, MD  Calcium Carbonate Antacid (TUMS PO) Take 1 tablet  by mouth as needed (indigestion).    Yes Historical Provider, MD  Calcium-Vitamin D-Vitamin K (VIACTIV PO) Take 1 tablet by mouth daily.    Yes Historical Provider, MD  cholecalciferol (VITAMIN D) 400 UNITS TABS tablet Take 400 Units by mouth daily.   Yes Historical Provider, MD  digoxin (LANOXIN) 0.125 MG tablet Take 1 tablet (125 mcg total) by mouth daily. 01/08/13  Yes Collene Gobble, MD  diphenhydrAMINE (BENADRYL) 25 mg capsule Take 25 mg by mouth every 6 (six) hours as needed for itching.   Yes Historical Provider, MD  HYDROcodone-acetaminophen (NORCO/VICODIN) 5-325 MG per tablet Take 1 tablet by mouth every 6 (six) hours as needed for pain.   Yes Historical Provider, MD  levothyroxine (SYNTHROID, LEVOTHROID) 125 MCG tablet Take 125 mcg by mouth daily.   Yes Historical Provider, MD  Multiple Vitamin (MULTIVITAMIN PO)  Take 1 tablet by mouth daily.     Yes Historical Provider, MD  simvastatin (ZOCOR) 10 MG tablet Take 10 mg by mouth at bedtime.   Yes Historical Provider, MD  bisacodyl (DULCOLAX) 10 MG suppository Place 10 mg rectally daily as needed for constipation (if constipation not relieved by milk of magnesia).    Historical Provider, MD  feeding supplement, RESOURCE BREEZE, (RESOURCE BREEZE) LIQD Take 1 Container by mouth 3 (three) times daily between meals. 10/18/13   Ashok Norris, NP  hydrochlorothiazide (HYDRODIURIL) 25 MG tablet 1/2 tablet on Mon, Wed, and Fri only 07/23/13   Chelle S Jeffery, PA-C  ibuprofen (ADVIL,MOTRIN) 800 MG tablet Take 1 tablet (800 mg total) by mouth 3 (three) times daily. 10/18/13 11/01/13  Ashok Norris, NP  traMADol (ULTRAM) 50 MG tablet Take 2 tablets (100 mg total) by mouth every 6 (six) hours. 10/18/13 11/18/13  Ashok Norris, NP   Physical Exam: Blood pressure 142/62, pulse 64, temperature 97.5 F (36.4 C), temperature source Oral, resp. rate 18, height 5\' 1"  (1.549 m), weight 144 lb 8 oz (65.545 kg), SpO2 92.00%. Filed Vitals:   10/22/13 1006  BP:    Pulse: 64  Temp:   Resp:    RR on my exam was 7 breaths per minute   General:  Sleepy, but somewhat alert to voice, mumbling words, orientation not able to be assessed, did not follow commands  Eyes: Pupils round, reactive to light when opening eyes  Neck: JVP difficult to assess given patient sitting upright in chair, appeared elevated  Cardiovascular: RR, NR, + 3/6 systolic murmur noted  Respiratory: RR of 7, no wheezing, no rales  Abdomen: Soft, non distended, tenderness difficult to assess given patient status  Skin: Thin with ecchymoses at sites of previous IVs  Musculoskeletal: 1+ pitting edema to bilateral mid calf  Psychiatric: unable to assess  Neurologic: patient not following commands, no posturing.   Labs on Admission:  Basic Metabolic Panel:  Recent Labs Lab 10/16/13 0414 10/20/13 1216 10/20/13 2000 10/21/13 0505 10/22/13 0530  NA 132* 114* 113* 116* 112*  K 3.9 2.6* 2.7* 3.1* 3.2*  CL 96 71* 73* 76* 74*  CO2 29 32 33* 33* 31  GLUCOSE 109* 140* 114* 112* 94  BUN 13 23 23 19 15   CREATININE 0.64 0.49* 0.41* 0.42* 0.43*  CALCIUM 8.7 8.8 8.6 8.3* 8.2*  MG  --   --  1.9  --   --    CBC:  Recent Labs Lab 10/16/13 0414 10/20/13 1216 10/21/13 0505  WBC 8.6 13.5* 13.9*  HGB 11.4* 11.8* 11.2*  HCT 34.4* 32.0* 30.8*  MCV 89.6 82.1 81.1  PLT 248 284 283    Radiological Exams on Admission: Dg Chest 2 View  10/20/2013   CLINICAL DATA:  Confusion. History of aortic stenosis.  EXAM: CHEST  2 VIEW  COMPARISON:  10/16/2012  FINDINGS: Calcified atherosclerotic disease affects the thoracic aorta. Lung volumes are low and there are multiple displaced right rib fractures. There is airspace consolidation involving the left lower lobe. Right-sided pneumothorax is identified which measures approximately 1.8 cm in thickness. This appears increased from previous exam.  IMPRESSION: 1. Mild increase in right apical pneumothorax which now measures approximately 1.8 cm  in thickness.  2. Suspect left lower lobe airspace consolidation.  These results will be called to the ordering clinician or representative by the Radiologist Assistant, and communication documented in the PACS Dashboard. .   Electronically Signed   By: Signa Kell  M.D.   On: 10/20/2013 16:25   Mr Brain Wo Contrast  10/20/2013   CLINICAL DATA:  Confusion. Recent fall. Stroke risk factors include hypertension and hyperlipidemia.  EXAM: MRI HEAD WITHOUT CONTRAST  TECHNIQUE: Multiplanar, multisequence MR imaging was performed. No intravenous contrast was administered.  COMPARISON:  CT head 10/18/2013.  FINDINGS: No evidence for acute infarction, hemorrhage, mass lesion, hydrocephalus, or extra-axial fluid. Advanced atrophy. Extensive chronic microvascular ischemic change. No vascular occlusion. Unremarkable midline structures. No visible osseous abnormality or scalp hematoma. Bilateral cataract extraction. No acute sinus or mastoid disease. Good agreement with prior CT.  IMPRESSION: Chronic changes as described. No evidence for acute stroke, occult shearing injury, or extra-axial hematoma.   Electronically Signed   By: Davonna Belling M.D.   On: 10/20/2013 19:08   Dg Chest Port 1 View  10/21/2013   CLINICAL DATA:  Pneumothorax.  EXAM: PORTABLE CHEST - 1 VIEW  COMPARISON:  10/20/2013  FINDINGS: The small right apical pneumothorax has not significantly changed. Again noted are displaced right lower rib fractures. No significant pleural effusion. Heart size is stable. Left lung is clear. Trachea is midline.  IMPRESSION: Stable small right apical pneumothorax. Minimal change from the previous examination.   Electronically Signed   By: Richarda Overlie M.D.   On: 10/21/2013 09:02   Time spent: 80 minutes  Criselda Peaches [facility] Pager [phone]  If 7PM-7AM, please contact night-coverage [email or web address] Password [email or web address] 10/22/2013, 1:13 PM

## 2013-10-22 NOTE — Progress Notes (Signed)
Multiple fractures of ribs of right side  Assessment: Hyponatremia worse today despite IV NS and oral Na tabs; still mildly hypokalemic as well. Stable from rib/pelvic fx  Plan: Will obtain medical consult for hyponatremia. Likely will need some hypertonic saline   Subjective: No c/o this am  Objective: Vital signs in last 24 hours: Temp:  [96.8 F (36 C)-97.6 F (36.4 C)] 97.5 F (36.4 C) (11/02 0517) Pulse Rate:  [61-76] 64 (11/02 1006) Resp:  [16-18] 18 (11/02 0517) BP: (142-167)/(57-68) 142/62 mmHg (11/02 1003) SpO2:  [92 %-94 %] 92 % (11/02 0517) Last BM Date: 10/21/13  Intake/Output from previous day: 11/01 0701 - 11/02 0700 In: 1591.7 [P.O.:840; I.V.:751.7] Out: -   General appearance: alert, no distress and slowed mentation Resp: clear to auscultation bilaterally GI: soft, non-tender; bowel sounds normal; no masses,  no organomegaly  Lab Results:  Results for orders placed during the hospital encounter of 10/15/13 (from the past 24 hour(s))  BASIC METABOLIC PANEL     Status: Abnormal   Collection Time    10/22/13  5:30 AM      Result Value Range   Sodium 112 (*) 135 - 145 mEq/L   Potassium 3.2 (*) 3.5 - 5.1 mEq/L   Chloride 74 (*) 96 - 112 mEq/L   CO2 31  19 - 32 mEq/L   Glucose, Bld 94  70 - 99 mg/dL   BUN 15  6 - 23 mg/dL   Creatinine, Ser 4.09 (*) 0.50 - 1.10 mg/dL   Calcium 8.2 (*) 8.4 - 10.5 mg/dL   GFR calc non Af Amer 86 (*) >90 mL/min   GFR calc Af Amer >90  >90 mL/min     Studies/Results Radiology     MEDS, Scheduled . acetaminophen  650 mg Oral Q8H  . amLODipine  5 mg Oral Daily  . balsalazide  1,500 mg Oral TID  . digoxin  125 mcg Oral Daily  . docusate sodium  200 mg Oral BID  . enoxaparin (LOVENOX) injection  40 mg Subcutaneous Q24H  . feeding supplement (RESOURCE BREEZE)  1 Container Oral TID BM  . hydrochlorothiazide  25 mg Oral Daily  . levothyroxine  125 mcg Oral QAC breakfast  . polyethylene glycol  17 g Oral Daily  .  simvastatin  10 mg Oral QHS  . sodium chloride  1 g Oral QID  . traMADol  100 mg Oral Q6H       LOS: 7 days    Currie Paris, MD, [facility], [place] [phone]   10/22/2013 10:45 AM

## 2013-10-22 NOTE — Progress Notes (Addendum)
Have attempted to obtain a central line for Savannah Todd.  PICC team unable to place one until tomorrow due to case load, have consulted PCCM for possible central line.  Called and spoke with PCCM and they plan to place central line during evening shift (around 7pm).  Have called and spoken to nurse re: administration of desmopressin and hypertonic saline.  Have changed start times of those medications as well to start in the evening after the central line is placed.

## 2013-10-22 NOTE — Procedures (Signed)
Central Venous Catheter Insertion Procedure Note Savannah Todd [id number] 12-05-1920  Procedure: Insertion of Central Venous Catheter Indications: Drug and/or fluid administration and Frequent blood sampling  Procedure Details Consent: Risks of procedure as well as the alternatives and risks of each were explained to the (patient/caregiver).  Consent for procedure obtained. Time Out: Verified patient identification, verified procedure, site/side was marked, verified correct patient position, special equipment/implants available, medications/allergies/relevent history reviewed, required imaging and test results available.  Performed  Maximum sterile technique was used including antiseptics, cap, gloves, gown, hand hygiene, mask and sheet. Skin prep: Chlorhexidine; local anesthetic administered A antimicrobial bonded/coated triple lumen catheter was placed in the left internal jugular vein using the Seldinger technique.  Evaluation Blood flow good Complications: No apparent complications Patient did tolerate procedure well. Chest X-ray ordered to verify placement.  CXR: pending.  Overton Mam, M.D. Pulmonary and Critical Care Medicine Call E-link with questions [phone] 10/22/2013, 8:28 PM

## 2013-10-23 DIAGNOSIS — E871 Hypo-osmolality and hyponatremia: Secondary | ICD-10-CM

## 2013-10-23 DIAGNOSIS — I359 Nonrheumatic aortic valve disorder, unspecified: Secondary | ICD-10-CM

## 2013-10-23 DIAGNOSIS — E876 Hypokalemia: Secondary | ICD-10-CM

## 2013-10-23 DIAGNOSIS — E039 Hypothyroidism, unspecified: Secondary | ICD-10-CM

## 2013-10-23 LAB — BASIC METABOLIC PANEL
BUN: 9 mg/dL (ref 6–23)
BUN: 10 mg/dL (ref 6–23)
CO2: 30 mEq/L (ref 19–32)
CO2: 31 mEq/L (ref 19–32)
CO2: 31 mEq/L (ref 19–32)
CO2: 30 mEq/L (ref 19–32)
CO2: 30 mEq/L (ref 19–32)
Calcium: 8.2 mg/dL — ABNORMAL LOW (ref 8.4–10.5)
Calcium: 7.9 mg/dL — ABNORMAL LOW (ref 8.4–10.5)
Chloride: 76 mEq/L — ABNORMAL LOW (ref 96–112)
Chloride: 77 mEq/L — ABNORMAL LOW (ref 96–112)
Chloride: 78 mEq/L — ABNORMAL LOW (ref 96–112)
Chloride: 77 mEq/L — ABNORMAL LOW (ref 96–112)
Creatinine, Ser: 0.39 mg/dL — ABNORMAL LOW (ref 0.50–1.10)
Creatinine, Ser: 0.4 mg/dL — ABNORMAL LOW (ref 0.50–1.10)
Creatinine, Ser: 0.47 mg/dL — ABNORMAL LOW (ref 0.50–1.10)
Creatinine, Ser: 0.49 mg/dL — ABNORMAL LOW (ref 0.50–1.10)
GFR calc Af Amer: >90 mL/min (ref >90)
GFR calc Af Amer: >90 mL/min (ref >90)
GFR calc non Af Amer: 83 mL/min — ABNORMAL LOW (ref >90)
Glucose, Bld: 83 mg/dL (ref 70–99)
Glucose, Bld: 104 mg/dL — ABNORMAL HIGH (ref 70–99)
Glucose, Bld: 152 mg/dL — ABNORMAL HIGH (ref 70–99)
Glucose, Bld: 120 mg/dL — ABNORMAL HIGH (ref 70–99)
Potassium: 2.9 mEq/L — ABNORMAL LOW (ref 3.5–5.1)
Potassium: 3.1 mEq/L — ABNORMAL LOW (ref 3.5–5.1)
Sodium: 111 mEq/L — CL (ref 135–145)
Sodium: 115 mEq/L — CL (ref 135–145)
Sodium: 116 mEq/L — CL (ref 135–145)

## 2013-10-23 LAB — DIGOXIN LEVEL: Digoxin Level: 0.7 ng/mL — ABNORMAL LOW (ref 0.8–2.0)

## 2013-10-23 MED ORDER — POTASSIUM CHLORIDE CRYS ER 20 MEQ PO TBCR
40.0000 meq | EXTENDED_RELEASE_TABLET | Freq: Once | ORAL | Status: AC
  Administered 2013-10-23: 40 meq via ORAL
  Filled 2013-10-23: qty 2

## 2013-10-23 MED ORDER — LEVOTHYROXINE SODIUM 150 MCG PO TABS
150.0000 ug | ORAL_TABLET | Freq: Every day | ORAL | Status: DC
  Administered 2013-10-24 – 2013-10-25 (×2): 150 ug via ORAL
  Filled 2013-10-23 (×3): qty 1

## 2013-10-23 MED ORDER — SENNA 8.6 MG PO TABS
1.0000 | ORAL_TABLET | Freq: Every day | ORAL | Status: DC
  Filled 2013-10-23 (×2): qty 1

## 2013-10-23 MED ORDER — POTASSIUM CHLORIDE CRYS ER 20 MEQ PO TBCR
40.0000 meq | EXTENDED_RELEASE_TABLET | Freq: Two times a day (BID) | ORAL | Status: DC
  Administered 2013-10-23 (×2): 40 meq via ORAL
  Filled 2013-10-23 (×4): qty 2

## 2013-10-23 NOTE — Progress Notes (Signed)
Pt's Na level still on critical low 117 @ 2155; MD's consistently following her treatment. Will continue to monitor.

## 2013-10-23 NOTE — Progress Notes (Signed)
NUTRITION FOLLOW UP  Intervention:   1.  Supplements; continue Resource Breeze po TID, each supplement provides 250 kcal and 9 grams of protein.  Supplements with Ensure pudding for medication administration as needed. 2. General healthful diet; encourage intake as able.  Offer protein in dairy products  And supplements.   Nutrition Dx:   Increased nutrient needs related to recent fall with rib fractures as evidenced by estimated nutrition needs.   Goal:  Intake to meet >90% of estimated nutrition needs.   Monitor:  Diet advancement, PO intake, labs, weight trend.   Assessment:   Patient was brought to the ER on 10/26 where CT showed a small right sided pneumothorax and right rib fractures.   Pt sleeping soundly at time of visit.  Discussed intake with RN who reports pt has been eating and drinking well.  Pt chooses high calorie foods, and is taking several supplements for additional protein.  RN states pt consumed ~50% of meals this afternoon for lunch, has consumed supplements, and is asking for more to drink.   Continue current plan.   Height: Ht Readings from Last 1 Encounters:  10/17/13 5\' 1"  (1.549 m)    Weight Status:   Wt Readings from Last 1 Encounters:  10/17/13 144 lb 8 oz (65.545 kg)    Re-estimated needs:  Kcal: 1350-1550 Protein: 75-85 Fluid: >1.5 L/day  Skin: intact  Diet Order: General  No intake or output data in the 24 hours ending 10/23/13 1434  Last BM: 11/3   Labs:   Recent Labs Lab 10/20/13 1216 10/20/13 2000  10/23/13 0044 10/23/13 0453 10/23/13 1055  NA 114* 113*  < > 111* 115* 116*  K 2.6* 2.7*  < > 2.9* 3.2* 3.1*  CL 71* 73*  < > 76* 77* 78*  CO2 32 33*  < > 30 31 31   BUN 23 23  < > 12 10 9   CREATININE 0.49* 0.41*  < > 0.39* 0.40* 0.38*  CALCIUM 8.8 8.6  < > 7.7* 8.2* 8.2*  MG  --  1.9  --   --   --  1.6  GLUCOSE 140* 114*  < > 168* 83 104*  < > = values in this interval not displayed.  CBG (last 3)  No results found for  this basename: GLUCAP,  in the last 72 hours  Scheduled Meds: . acetaminophen  650 mg Oral Q8H  . amLODipine  5 mg Oral Daily  . balsalazide  1,500 mg Oral TID  . digoxin  125 mcg Oral Daily  . docusate sodium  200 mg Oral BID  . enoxaparin (LOVENOX) injection  40 mg Subcutaneous Q24H  . feeding supplement (RESOURCE BREEZE)  1 Container Oral TID BM  . [START ON 10/24/2013] levothyroxine  150 mcg Oral QAC breakfast  . potassium chloride  40 mEq Oral BID  . senna  1 tablet Oral Daily  . simvastatin  10 mg Oral QHS  . sodium chloride  1 g Oral QID  . traMADol  100 mg Oral Q6H    Continuous Infusions: . sodium chloride (hypertonic) 15 mL/hr (10/23/13 0035)    Loyce Dys, MS RD LDN Clinical Inpatient Dietitian Pager: [phone] Weekend/After hours pager: [phone]

## 2013-10-23 NOTE — Clinical Social Work Note (Signed)
Clinical Social Worker continuing to follow patient and family for support and discharge planning needs.  Patient is from [place] and per family plans to return to [place] once medically ready.  CSW left message with facility to confirm patient return once medically ready - await return call.  CSW remains available for support and and to facilitate patient discharge needs once medically stable.  Macario Golds, [place] [phone]

## 2013-10-23 NOTE — Progress Notes (Signed)
Chart reviewed. Imp/Rec  Hyponatremia:  Likely multifactorial: hypotonic IVF on admission (which was stopped several days ago), thiazide diuretic (stopped yesterday), hypothyroid state (TSH high at 7).  Cortisol pending.  Urine osmolarity inappropriately high, so could be an element of SIADH.  Also, daily miralax may be contributing.  D/c miralax. Increase synthroid to 150 mcg daily.  Continue hypertonic saline and monitor sodiums q4h to avoid increasing too rapidly.  D/c DDAVP.    Hypokalemia being repleted.  Will check magnesium  Abnormal EKG reading prolonged QT.  Could be error, since q waves barely visible.  Will review with cardiology.  Replete K. Check mag.  Place on tele for now and repeat in am.    Aortic stenosis  Dementia  DNR  Subjective: No complaints  Objective: Vital signs in last 24 hours: Temp:  [97.9 F (36.6 C)-98.4 F (36.9 C)] 97.9 F (36.6 C) (11/03 0506) Pulse Rate:  [54-77] 54 (11/03 0837) Resp:  [18-20] 20 (11/03 0506) BP: (134-145)/(49-76) 139/76 mmHg (11/03 0506) SpO2:  [95 %-98 %] 95 % (11/03 0506) Weight change:  Last BM Date: 10/21/13  Intake/Output from previous day: 11/02 0701 - 11/03 0700 In: 180 [P.O.:180] Out: -  Intake/Output this shift:   Gen:  Alert.  Working with PT. Eating pudding in recliner Lungs CTA without WRR CV RRR with harsh murmur Abd: S, NT, ND Ext: no CCE  Lab Results:  Recent Labs  10/20/13 1216 10/21/13 0505  WBC 13.5* 13.9*  HGB 11.8* 11.2*  HCT 32.0* 30.8*  PLT 284 283   BMET  Recent Labs  10/23/13 0044 10/23/13 0453  NA 111* 115*  K 2.9* 3.2*  CL 76* 77*  CO2 30 31  GLUCOSE 168* 83  BUN 12 10  CREATININE 0.39* 0.40*  CALCIUM 7.7* 8.2*   Serum osm 240 Urine osm 539 Urine Na+59  Studies/Results: Dg Chest Port 1 View  10/22/2013   CLINICAL DATA:  Status post central line placement  EXAM: PORTABLE CHEST - 1 VIEW  COMPARISON:  10/21/2013  FINDINGS: The cardiac shadow is stable. A left jugular  central line is been placed with the catheter tip projecting over the proximal superior vena cava. No pneumothorax is noted. The lungs are stable. The previously seen right pneumothorax is not well visualized. Multiple right rib fractures are again noted.  IMPRESSION: Status post central line placement without evidence of pneumothorax.  Otherwise stable chest.   Electronically Signed   By: Alcide Clever M.D.   On: 10/22/2013 21:01    Medications:  Scheduled Meds: . acetaminophen  650 mg Oral Q8H  . amLODipine  5 mg Oral Daily  . balsalazide  1,500 mg Oral TID  . desmopressin  1 mcg Subcutaneous Q8H  . digoxin  125 mcg Oral Daily  . docusate sodium  200 mg Oral BID  . enoxaparin (LOVENOX) injection  40 mg Subcutaneous Q24H  . feeding supplement (RESOURCE BREEZE)  1 Container Oral TID BM  . levothyroxine  125 mcg Oral QAC breakfast  . polyethylene glycol  17 g Oral Daily  . potassium chloride  40 mEq Oral BID  . simvastatin  10 mg Oral QHS  . sodium chloride  1 g Oral QID  . traMADol  100 mg Oral Q6H   Continuous Infusions: . sodium chloride (hypertonic) 15 mL/hr (10/23/13 0035)   PRN Meds:.bisacodyl, HYDROcodone-acetaminophen, morphine injection, ondansetron (ZOFRAN) IV, ondansetron, sodium chloride   LOS: 8 days   Bohdi Leeds L 10/23/2013, 11:51 AM

## 2013-10-23 NOTE — Progress Notes (Signed)
Patient ID: Savannah Todd, female   DOB: 06-10-1920, 79 y.o.   MRN: [id number]   LOS: 8 days   Subjective: No c/o. Oriented to person only.   Objective: Vital signs in last 24 hours: Temp:  [97.9 F (36.6 C)-98.4 F (36.9 C)] 97.9 F (36.6 C) (11/03 0506) Pulse Rate:  [64-77] 77 (11/03 0506) Resp:  [18-20] 20 (11/03 0506) BP: (134-145)/(49-76) 139/76 mmHg (11/03 0506) SpO2:  [95 %-98 %] 95 % (11/03 0506) Last BM Date: 10/21/13   Laboratory  BMET  Recent Labs  10/23/13 0044 10/23/13 0453  NA 111* 115*  K 2.9* 3.2*  CL 76* 77*  CO2 30 31  GLUCOSE 168* 83  BUN 12 10  CREATININE 0.39* 0.40*  CALCIUM 7.7* 8.2*    Physical Exam General appearance: no distress Resp: clear to auscultation bilaterally Cardio: Irreg irreg, suspect some ectopy, will get EKG GI: normal findings: bowel sounds normal and soft, non-tender   Assessment/Plan: Fall  Right side rib fractures/ptx- pulmonary toilet Hyponatremia -- Appreciate IM help. Awaiting osmolalities and cortisol tests from this morning. ABL Anemia: Stable FEN -- EKG, K+ for hypokalemia VTE: SCDs, lovenox  Dispo: Hyponatremia    Freeman Caldron, PA-C Pager: [phone] General Trauma PA Pager: [phone]   10/23/2013

## 2013-10-23 NOTE — Progress Notes (Signed)
Appreciate medicine management of Na. TLC in and will likely need 3%NaCl. Repleting K+. Patient examined and I agree with the assessment and plan  Violeta Gelinas, MD, MPH, FACS Pager: [phone]  10/23/2013 10:27 AM

## 2013-10-23 NOTE — Progress Notes (Signed)
Physical Therapy Treatment Patient Details Name: Savannah Todd MRN: [id number] DOB: 06-Jan-1920 Today's Date: 10/23/2013 Time: 0102-7253 PT Time Calculation (min): 31 min  PT Assessment / Plan / Recommendation  History of Present Illness Savannah Todd is a 79 y.o. female who was at her nursing home and fell twice yesterdayCT Abd with acute/subacute L2 and L1 compression fractures with fracture of posterior elements. Also with rib fractures.   PT Comments   Patient able to initiate some steps with ambulation today, however, patient again limited by need for use of commode and hygiene. Patient assisted with standing transfers from multiple surfaces and left in chair with blinds open and nsg aware. Will continue to follow and progress activity as tolerated. Continue to recommend SNF upon discharge.   Follow Up Recommendations  SNF           Equipment Recommendations  Rolling walker with 5" wheels       Frequency Min 3X/week   Progress towards PT Goals Progress towards PT goals: Not progressing toward goals - comment  Plan Current plan remains appropriate    Precautions / Restrictions Precautions Precautions: Fall Precaution Comments: pt also with significant dementia Required Braces or Orthoses: Spinal Brace Spinal Brace: Lumbar corset;Applied in sitting position Spinal Brace Comments: donned brace in sitting.  Pt tolerated well. Restrictions Weight Bearing Restrictions: No   Pertinent Vitals/Pain Patient BP assessed ([id number]) BP 127/83, SpO2 94% on room air prior to activity HR 67    Mobility  Bed Mobility Bed Mobility: Rolling Left;Left Sidelying to Sit;Sitting - Scoot to Edge of Bed Rolling Left: 3: Mod assist Left Sidelying to Sit: 3: Mod assist Sitting - Scoot to Edge of Bed: 4: Min assist (max time to perform) Details for Bed Mobility Assistance: Pt required step by step cues for motor planning today.  Appear she may be worse cognitively than she was on admit.  Pt  would verbalize understanding of commands but would not initiate bed mobility tasks. Transfers Transfers: Sit to Stand;Stand to Sit Sit to Stand: 3: Mod assist Stand to Sit: 3: Mod assist Details for Transfer Assistance: VCs and tactile cues for hand placement and proper positioning. Performed sit to stand x 4 for hygiene purposes Ambulation/Gait Ambulation/Gait Assistance: 3: Mod assist;2: Max assist Ambulation Distance (Feet): 6 Feet Assistive device: Rolling walker Ambulation/Gait Assistance Details: mod assist for stability initally, then patient started to buckle RLE requiring max assist to maintain upright. Manual assist for RW support and VCs for sequencing and stride Gait Pattern: Step-to pattern;Decreased stride length;Shuffle;Lateral hip instability;Trunk flexed;Narrow base of support Wheelchair Mobility Wheelchair Mobility: No    Exercises General Exercises - Lower Extremity Ankle Circles/Pumps: AROM;Both;10 reps (reclined in chair)     PT Goals (current goals can now be found in the care plan section) Acute Rehab PT Goals Patient Stated Goal: none stated.  Pt unable to understand when goals were offered. PT Goal Formulation: With patient Time For Goal Achievement: 10/23/13 Potential to Achieve Goals: Good  Visit Information  Last PT Received On: 10/23/13 Assistance Needed: +2 (For safety and ambulation) History of Present Illness: Savannah Todd is a 79 y.o. female who was at her nursing home and fell twice yesterdayCT Abd with acute/subacute L2 and L1 compression fractures with fracture of posterior elements. Also with rib fractures.    Subjective Data  Subjective: I have to use the bathroom Patient Stated Goal: none stated.  Pt unable to understand when goals were offered.   Cognition  Cognition  Arousal/Alertness: Awake/alert Behavior During Therapy: WFL for tasks assessed/performed Overall Cognitive Status: History of cognitive impairments - at baseline Area of  Impairment: Orientation;Attention;Memory;Following commands;Safety/judgement;Awareness;Problem solving Orientation Level: Disoriented to;Place;Time;Situation Current Attention Level: Focused Memory: Decreased short-term memory Following Commands: Follows one step commands inconsistently Safety/Judgement: Decreased awareness of safety;Decreased awareness of deficits Awareness: Intellectual Problem Solving: Slow processing;Decreased initiation;Difficulty sequencing;Requires verbal cues    Balance  Balance Balance Assessed: Yes  End of Session PT - End of Session Equipment Utilized During Treatment: Gait belt Activity Tolerance: Other (comment) (limited by need for hygiene) Patient left: in chair;with call bell/phone within reach (blinds open, tray in front, nsg aware) Nurse Communication: Mobility status (call to report dizziness in chair)   GP     Fabio Asa 10/23/2013, 12:48 PM Charlotte Crumb, PT DPT  [phone]

## 2013-10-24 DIAGNOSIS — F0390 Unspecified dementia without behavioral disturbance: Secondary | ICD-10-CM

## 2013-10-24 LAB — BASIC METABOLIC PANEL
BUN: 10 mg/dL (ref 6–23)
BUN: 9 mg/dL (ref 6–23)
BUN: 9 mg/dL (ref 6–23)
BUN: 9 mg/dL (ref 6–23)
BUN: 7 mg/dL (ref 6–23)
CO2: 30 mEq/L (ref 19–32)
CO2: 28 mEq/L (ref 19–32)
CO2: 27 mEq/L (ref 19–32)
Calcium: 8 mg/dL — ABNORMAL LOW (ref 8.4–10.5)
Calcium: 8.2 mg/dL — ABNORMAL LOW (ref 8.4–10.5)
Calcium: 7.9 mg/dL — ABNORMAL LOW (ref 8.4–10.5)
Chloride: 88 mEq/L — ABNORMAL LOW (ref 96–112)
Chloride: 89 mEq/L — ABNORMAL LOW (ref 96–112)
Chloride: 90 mEq/L — ABNORMAL LOW (ref 96–112)
Creatinine, Ser: 0.42 mg/dL — ABNORMAL LOW (ref 0.50–1.10)
Creatinine, Ser: 0.44 mg/dL — ABNORMAL LOW (ref 0.50–1.10)
Creatinine, Ser: 0.43 mg/dL — ABNORMAL LOW (ref 0.50–1.10)
Creatinine, Ser: 0.45 mg/dL — ABNORMAL LOW (ref 0.50–1.10)
Creatinine, Ser: 0.37 mg/dL — ABNORMAL LOW (ref 0.50–1.10)
GFR calc Af Amer: >90 mL/min (ref >90)
GFR calc Af Amer: >90 mL/min (ref >90)
GFR calc non Af Amer: 86 mL/min — ABNORMAL LOW (ref >90)
GFR calc non Af Amer: >90 mL/min (ref >90)
Glucose, Bld: 129 mg/dL — ABNORMAL HIGH (ref 70–99)
Glucose, Bld: 101 mg/dL — ABNORMAL HIGH (ref 70–99)
Glucose, Bld: 131 mg/dL — ABNORMAL HIGH (ref 70–99)
Glucose, Bld: 128 mg/dL — ABNORMAL HIGH (ref 70–99)
Glucose, Bld: 126 mg/dL — ABNORMAL HIGH (ref 70–99)
Potassium: 4.4 mEq/L (ref 3.5–5.1)
Sodium: 119 mEq/L — CL (ref 135–145)
Sodium: 121 mEq/L — ABNORMAL LOW (ref 135–145)
Sodium: 124 mEq/L — ABNORMAL LOW (ref 135–145)
Sodium: 125 mEq/L — ABNORMAL LOW (ref 135–145)

## 2013-10-24 MED ORDER — MAGNESIUM SULFATE 40 MG/ML IJ SOLN
2.0000 g | Freq: Once | INTRAMUSCULAR | Status: AC
  Administered 2013-10-24: 2 g via INTRAVENOUS
  Filled 2013-10-24: qty 50

## 2013-10-24 NOTE — Progress Notes (Signed)
Occupational Therapy Treatment Patient Details Name: Savannah Todd MRN: [id number] DOB: 09/04/1920 Today's Date: 10/24/2013 Time: 4098-1191 OT Time Calculation (min): 33 min  OT Assessment / Plan / Recommendation  History of present illness Savannah Todd is a 79 y.o. female who was at her nursing home and fell twice yesterdayCT Abd with acute/subacute L2 and L1 compression fractures with fracture of posterior elements. Also with rib fractures.   OT comments  Pt not progressing towards goals. Pt performed toileting tasks today and stood multiple times from [facility] and was incontinent. +2 Assist for transfers for safety. OT downgraded some goals in care plan section.   Follow Up Recommendations  SNF    Barriers to Discharge       Equipment Recommendations  None recommended by OT    Recommendations for Other Services    Frequency Min 2X/week   Progress towards OT Goals Progress towards OT goals: Not progressing toward goals - comment (goals downgraded)  Plan Discharge plan remains appropriate    Precautions / Restrictions Precautions Precautions: Fall;Back Required Braces or Orthoses: Spinal Brace Spinal Brace: Lumbar corset;Applied in sitting position Restrictions Weight Bearing Restrictions: No   Pertinent Vitals/Pain Appeared to be in pain when getting back in bed. Repositioned.     ADL  Grooming: Performed;Brushing hair;Minimal assistance;Moderate assistance;Wash/dry face;Teeth care Where Assessed - Grooming: Supported sitting Lower Body Bathing: Other (comment) (only washed tops of legs) Where Assessed - Lower Body Bathing: Supported sitting Upper Body Dressing: Maximal assistance (back brace) Where Assessed - Upper Body Dressing: Supported sitting Toilet Transfer: +2 Total assistance Toilet Transfer: Patient Percentage: 50% Statistician Method: Sit to Barista: Materials engineer and Hygiene: +1 Total  assistance Where Assessed - Glass blower/designer Manipulation and Hygiene: Sit to stand from 3-in-1 or toilet Equipment Used: Back brace;Gait belt;Rolling walker Transfers/Ambulation Related to ADLs: +2 total A (30%) for stand pivot transfer and +2 Total A (50%/60%) for sit <> stand transfers ADL Comments: Pt incontinent of urine upon standing-pivoted to BSC. Pt stood multiple times from [facility] and urinated on floor multiple times. Pt able to wash tops of legs with washcloth. OT educated on back precautions.  Pt sat on BSC and combed hair and also washed face and brushed teeth-Assistance to sit up straight as she was leaning way forwards.     OT Diagnosis:    OT Problem List:   OT Treatment Interventions:     OT Goals(current goals can now be found in the care plan section) Acute Rehab OT Goals Patient Stated Goal: none stated OT Goal Formulation: Patient unable to participate in goal setting Time For Goal Achievement: 11/01/13 Potential to Achieve Goals: Fair ADL Goals Pt Will Perform Eating: with set-up;sitting Pt Will Perform Grooming: with min assist;standing Pt Will Perform Upper Body Dressing: with supervision;sitting (assist for brace only) Pt Will Perform Lower Body Dressing: with mod assist;sit to/from stand Pt Will Transfer to Toilet: with min assist;ambulating;bedside commode Pt Will Perform Toileting - Clothing Manipulation and hygiene: with min assist;sit to/from stand;sitting/lateral leans  Visit Information  Last OT Received On: 10/24/13 Assistance Needed: +1 History of Present Illness: Savannah Todd is a 79 y.o. female who was at her nursing home and fell twice yesterdayCT Abd with acute/subacute L2 and L1 compression fractures with fracture of posterior elements. Also with rib fractures.    Subjective Data      Prior Functioning       Cognition  Cognition Arousal/Alertness: Awake/alert Behavior  During Therapy: WFL for tasks assessed/performed Overall Cognitive  Status: History of cognitive impairments - at baseline Area of Impairment: Orientation;Following commands;Safety/judgement Orientation Level: Disoriented to;Time Following Commands: Follows one step commands inconsistently Safety/Judgement: Decreased awareness of safety    Mobility  Bed Mobility Bed Mobility: Sit to Sidelying Left;Rolling Right;Rolling Left Rolling Right: 4: Min assist Rolling Left: 4: Min assist Sit to Sidelying Left: 1: +2 Total assist Sit to Sidelying Left: Patient Percentage: 30% Details for Bed Mobility Assistance: Assistance with trunk and legs when going to sidelying position. Cues for technique. Transfers Transfers: Sit to Stand;Stand to Sit Sit to Stand: 1: +2 Total assist;With upper extremity assist;From chair/3-in-1 Sit to Stand: Patient Percentage: 50% Stand to Sit: 1: +2 Total assist;To chair/3-in-1;To bed Stand to Sit: Patient Percentage: 60% Details for Transfer Assistance: cues for hand placement    Exercises      Balance     End of Session OT - End of Session Equipment Utilized During Treatment: Gait belt;Back brace;Rolling walker Activity Tolerance: Patient tolerated treatment well Patient left: in bed;with call bell/phone within reach;with bed alarm set  GO     Earlie Raveling OTR/L [phone] 10/24/2013, 3:51 PM

## 2013-10-24 NOTE — Progress Notes (Addendum)
  Imp/Rec  Hyponatremia  improving:  Likely multifactorial: hypotonic IVF on admission (which was stopped several days ago), thiazide diuretic (stopped), hypothyroid state (TSH high at 7).  Cortisol 23, ruling out adrenal insufficiency.  Urine osmolarity inappropriately high, so could be an element of SIADH.  Also, daily miralax may be contributing. miralax d/c'd and started on senna and colace. Increased synthroid to 150 mcg daily.  Continue hypertonic saline and monitor sodiums q4h to avoid increasing too rapidly.  Would continue until sodium above about 125.  Hypokalemia corrected.  Magnesium borderline low.  Will give 2 gm iv (see below)  Abnormal EKG reading prolonged QT.  likely errooneous, since q waves barely visible.  reviewed with cardiology, who agrees.  Nevertheless, will give 2 gm mag to keep closer to 2. Repeat EKG pending.  on tele for now.    Aortic stenosis  Dementia  DNR  Subjective: No complaints  Objective: Vital signs in last 24 hours: Temp:  [97.6 F (36.4 C)-98 F (36.7 C)] 97.6 F (36.4 C) (11/04 0636) Pulse Rate:  [54-67] 67 (11/04 0636) Resp:  [20] 20 (11/04 0636) BP: (160-166)/(64-72) 160/64 mmHg (11/04 0636) SpO2:  [93 %-95 %] 93 % (11/04 0636) Weight change:  Last BM Date: 10/24/13  Intake/Output from previous day:   Intake/Output this shift:   Gen:  Alert in recliner. Appears weak. Confused. Lungs CTA without WRR CV RRR with harsh murmur Abd: S, NT, ND Ext: no CCE  Lab Results: No results found for this basename: WBC, HGB, HCT, PLT,  in the last 72 hours BMET  Recent Labs  10/24/13 0104 10/24/13 0555  NA 119* 121*  K 3.9 4.4  CL 83* 87*  CO2 30 29  GLUCOSE 129* 101*  BUN 10 9  CREATININE 0.42* 0.44*  CALCIUM 8.0* 8.2*   Serum osm 240 Urine osm 539 Urine Na+59  Studies/Results: Dg Chest Port 1 View  10/22/2013   CLINICAL DATA:  Status post central line placement  EXAM: PORTABLE CHEST - 1 VIEW  COMPARISON:  10/21/2013   FINDINGS: The cardiac shadow is stable. A left jugular central line is been placed with the catheter tip projecting over the proximal superior vena cava. No pneumothorax is noted. The lungs are stable. The previously seen right pneumothorax is not well visualized. Multiple right rib fractures are again noted.  IMPRESSION: Status post central line placement without evidence of pneumothorax.  Otherwise stable chest.   Electronically Signed   By: Alcide Clever M.D.   On: 10/22/2013 21:01    Medications:  Scheduled Meds: . acetaminophen  650 mg Oral Q8H  . amLODipine  5 mg Oral Daily  . balsalazide  1,500 mg Oral TID  . digoxin  125 mcg Oral Daily  . docusate sodium  200 mg Oral BID  . enoxaparin (LOVENOX) injection  40 mg Subcutaneous Q24H  . feeding supplement (RESOURCE BREEZE)  1 Container Oral TID BM  . levothyroxine  150 mcg Oral QAC breakfast  . senna  1 tablet Oral Daily  . simvastatin  10 mg Oral QHS  . sodium chloride  1 g Oral QID  . traMADol  100 mg Oral Q6H   Continuous Infusions: . sodium chloride (hypertonic) 15 mL/hr (10/23/13 0035)   PRN Meds:.bisacodyl, HYDROcodone-acetaminophen, morphine injection, ondansetron (ZOFRAN) IV, ondansetron, sodium chloride   LOS: 9 days   Deloros Beretta L 10/24/2013, 8:17 AM

## 2013-10-24 NOTE — Progress Notes (Signed)
Pt's Na level at 0100 was 119. Will continue to monitor.

## 2013-10-24 NOTE — ED Provider Notes (Signed)
Medical screening examination/treatment/procedure(s) were conducted as a shared visit with non-physician practitioner(s) and myself.  I personally evaluated the patient during the encounter.  EKG Interpretation     Ventricular Rate:    PR Interval:    QRS Duration:   QT Interval:    QTC Calculation:   R Axis:     Text Interpretation:              Pt comes in with cc of fall. Multiple rib fractures, with PTX. Pt's pain tolerated, vitals are stable. Surgery consulted.  Derwood Kaplan, MD 10/24/13 0100

## 2013-10-24 NOTE — Progress Notes (Signed)
Na 125. D/c hypertonic saline and monitor  Crista Curb, M.D.

## 2013-10-24 NOTE — Progress Notes (Signed)
Patient ID: Savannah Todd, female   DOB: 07/14/1920, 79 y.o.   MRN: [id number]   LOS: 9 days   Subjective: Says she feels ok. More alert today but still only oriented to person.   Objective: Vital signs in last 24 hours: Temp:  [97.6 F (36.4 C)-98 F (36.7 C)] 97.6 F (36.4 C) (11/04 0636) Pulse Rate:  [54-67] 67 (11/04 0636) Resp:  [20] 20 (11/04 0636) BP: (160-166)/(64-72) 160/64 mmHg (11/04 0636) SpO2:  [93 %-95 %] 93 % (11/04 0636) Last BM Date: 10/24/13   Laboratory  BMET  Recent Labs  10/24/13 0104 10/24/13 0555  NA 119* 121*  K 3.9 4.4  CL 83* 87*  CO2 30 29  GLUCOSE 129* 101*  BUN 10 9  CREATININE 0.42* 0.44*  CALCIUM 8.0* 8.2*    Physical Exam General appearance: alert and no distress Resp: clear to auscultation bilaterally Cardio: regular rate and rhythm GI: normal findings: bowel sounds normal and soft, non-tender   Assessment/Plan: Fall  Right side rib fractures/ptx- pulmonary toilet  Hyponatremia -- Appreciate IM help. Slowly improving. Hypokalemia -- Normalized ABL Anemia: Stable  FEN -- Decrease K+ supplement VTE: SCDs, lovenox  Dispo: Hyponatremia    Freeman Caldron, PA-C Pager: [phone] General Trauma PA Pager: [phone]   10/24/2013

## 2013-10-24 NOTE — Progress Notes (Signed)
MS a little better today. D/W Dr. Lendell Caprice. Needs to stay until Na 125+. Appreciate their help. Patient examined and I agree with the assessment and plan  Violeta Gelinas, MD, MPH, FACS Pager: [phone]  10/24/2013 12:21 PM

## 2013-10-24 NOTE — Progress Notes (Signed)
EKG shows normal QT. D/C tele.  Crista Curb, M.D.

## 2013-10-24 NOTE — Progress Notes (Signed)
UR completed.  Christropher Gintz, RN BSN MHA CCM Trauma/Neuro ICU Case Manager [phone]  

## 2013-10-24 NOTE — Progress Notes (Signed)
Physical Therapy Treatment Patient Details Name: Savannah Todd MRN: [id number] DOB: 1920/09/07 Today's Date: 10/24/2013 Time: 1330-1400 PT Time Calculation (min): 30 min  PT Assessment / Plan / Recommendation  History of Present Illness Savannah Todd is a 79 y.o. female who was at her nursing home and fell twice yesterdayCT Abd with acute/subacute L2 and L1 compression fractures with fracture of posterior elements. Also with rib fractures.   PT Comments   Pt willing and able to participate.  Making slow progress.  Goals downgraded to min A level as pt is currently mod-max A for mobility due to weakness and decreased initiation.  Follow Up Recommendations  SNF     Does the patient have the potential to tolerate intense rehabilitation     Barriers to Discharge        Equipment Recommendations  Rolling walker with 5" wheels    Recommendations for Other Services    Frequency Min 3X/week   Progress towards PT Goals Progress towards PT goals: Goals downgraded-see care plan;Not progressing toward goals - comment  Plan Current plan remains appropriate    Precautions / Restrictions Precautions Precautions: Fall Required Braces or Orthoses: Spinal Brace Spinal Brace: Lumbar corset;Applied in sitting position Restrictions Weight Bearing Restrictions: No   Pertinent Vitals/Pain No c/o pain    Mobility  Bed Mobility Rolling Right: 3: Mod assist Rolling Left: 3: Mod assist Left Sidelying to Sit: 2: Max assist Supine to Sit: 2: Max assist Details for Bed Mobility Assistance: Pt requires cues for all mobility, requires lifting assist for LEs and trunk, able to attempt to assist with UEs.  performed multiple attempts at rolling to change bed linens and gown after incontinence Transfers Sit to Stand: 3: Mod assist Sit to Stand: Patient Percentage: 50% Details for Transfer Assistance: cuing for UE placement, lifting assist Ambulation/Gait Ambulation/Gait Assistance: 3: Mod  assist Ambulation Distance (Feet): 4 Feet Assistive device: Rolling walker Ambulation/Gait Assistance Details: Pt able to take a few small steps forward and backward with RW.  Forward flexed posture, narrow BOS and short step/stride length.    Exercises     PT Diagnosis:    PT Problem List:   PT Treatment Interventions:     PT Goals (current goals can now be found in the care plan section)    Visit Information  Last PT Received On: 10/24/13 Assistance Needed: +1 History of Present Illness: Savannah Todd is a 79 y.o. female who was at her nursing home and fell twice yesterdayCT Abd with acute/subacute L2 and L1 compression fractures with fracture of posterior elements. Also with rib fractures.    Subjective Data      Cognition  Cognition Arousal/Alertness: Awake/alert Behavior During Therapy: WFL for tasks assessed/performed Orientation Level: Disoriented to;Place;Time;Situation Following Commands: Follows one step commands consistently    Balance     End of Session PT - End of Session Equipment Utilized During Treatment: Gait belt Activity Tolerance: Patient limited by fatigue Patient left: in bed;with call bell/phone within reach;with family/visitor present;with bed alarm set   GP     Savannah Todd 10/24/2013, 2:12 PM

## 2013-10-25 ENCOUNTER — Encounter: Payer: Self-pay | Admitting: Internal Medicine

## 2013-10-25 DIAGNOSIS — E785 Hyperlipidemia, unspecified: Secondary | ICD-10-CM | POA: Diagnosis not present

## 2013-10-25 DIAGNOSIS — S32020D Wedge compression fracture of second lumbar vertebra, subsequent encounter for fracture with routine healing: Secondary | ICD-10-CM

## 2013-10-25 DIAGNOSIS — S270XXD Traumatic pneumothorax, subsequent encounter: Secondary | ICD-10-CM

## 2013-10-25 DIAGNOSIS — S32009A Unspecified fracture of unspecified lumbar vertebra, initial encounter for closed fracture: Secondary | ICD-10-CM | POA: Diagnosis not present

## 2013-10-25 DIAGNOSIS — M6281 Muscle weakness (generalized): Secondary | ICD-10-CM | POA: Diagnosis not present

## 2013-10-25 DIAGNOSIS — I359 Nonrheumatic aortic valve disorder, unspecified: Secondary | ICD-10-CM | POA: Diagnosis not present

## 2013-10-25 DIAGNOSIS — Z9181 History of falling: Secondary | ICD-10-CM | POA: Diagnosis not present

## 2013-10-25 DIAGNOSIS — N390 Urinary tract infection, site not specified: Secondary | ICD-10-CM

## 2013-10-25 DIAGNOSIS — D62 Acute posthemorrhagic anemia: Secondary | ICD-10-CM

## 2013-10-25 DIAGNOSIS — R1310 Dysphagia, unspecified: Secondary | ICD-10-CM | POA: Diagnosis not present

## 2013-10-25 DIAGNOSIS — E871 Hypo-osmolality and hyponatremia: Secondary | ICD-10-CM | POA: Diagnosis not present

## 2013-10-25 DIAGNOSIS — Z5189 Encounter for other specified aftercare: Secondary | ICD-10-CM | POA: Diagnosis not present

## 2013-10-25 DIAGNOSIS — I119 Hypertensive heart disease without heart failure: Secondary | ICD-10-CM | POA: Diagnosis not present

## 2013-10-25 DIAGNOSIS — E039 Hypothyroidism, unspecified: Secondary | ICD-10-CM | POA: Diagnosis not present

## 2013-10-25 DIAGNOSIS — S32010D Wedge compression fracture of first lumbar vertebra, subsequent encounter for fracture with routine healing: Secondary | ICD-10-CM

## 2013-10-25 DIAGNOSIS — K589 Irritable bowel syndrome without diarrhea: Secondary | ICD-10-CM | POA: Diagnosis not present

## 2013-10-25 DIAGNOSIS — IMO0002 Reserved for concepts with insufficient information to code with codable children: Secondary | ICD-10-CM | POA: Diagnosis not present

## 2013-10-25 DIAGNOSIS — S2239XA Fracture of one rib, unspecified side, initial encounter for closed fracture: Secondary | ICD-10-CM | POA: Diagnosis not present

## 2013-10-25 DIAGNOSIS — E876 Hypokalemia: Secondary | ICD-10-CM

## 2013-10-25 DIAGNOSIS — IMO0001 Reserved for inherently not codable concepts without codable children: Secondary | ICD-10-CM | POA: Diagnosis not present

## 2013-10-25 DIAGNOSIS — S2249XA Multiple fractures of ribs, unspecified side, initial encounter for closed fracture: Secondary | ICD-10-CM | POA: Diagnosis not present

## 2013-10-25 DIAGNOSIS — R4182 Altered mental status, unspecified: Secondary | ICD-10-CM | POA: Diagnosis not present

## 2013-10-25 DIAGNOSIS — Z593 Problems related to living in residential institution: Secondary | ICD-10-CM | POA: Diagnosis not present

## 2013-10-25 DIAGNOSIS — S2241XD Multiple fractures of ribs, right side, subsequent encounter for fracture with routine healing: Secondary | ICD-10-CM

## 2013-10-25 DIAGNOSIS — E7800 Pure hypercholesterolemia, unspecified: Secondary | ICD-10-CM

## 2013-10-25 DIAGNOSIS — F0390 Unspecified dementia without behavioral disturbance: Secondary | ICD-10-CM

## 2013-10-25 DIAGNOSIS — R269 Unspecified abnormalities of gait and mobility: Secondary | ICD-10-CM | POA: Diagnosis not present

## 2013-10-25 DIAGNOSIS — R488 Other symbolic dysfunctions: Secondary | ICD-10-CM | POA: Diagnosis not present

## 2013-10-25 DIAGNOSIS — I350 Nonrheumatic aortic (valve) stenosis: Secondary | ICD-10-CM

## 2013-10-25 DIAGNOSIS — S2242XD Multiple fractures of ribs, left side, subsequent encounter for fracture with routine healing: Secondary | ICD-10-CM

## 2013-10-25 LAB — BASIC METABOLIC PANEL
BUN: 8 mg/dL (ref 6–23)
Creatinine, Ser: 0.42 mg/dL — ABNORMAL LOW (ref 0.50–1.10)
GFR calc Af Amer: >90 mL/min (ref >90)
GFR calc non Af Amer: 87 mL/min — ABNORMAL LOW (ref >90)

## 2013-10-25 MED ORDER — AMLODIPINE BESYLATE 10 MG PO TABS: 10.0000 mg | ORAL_TABLET | Freq: Every day | ORAL | Status: DC

## 2013-10-25 MED ORDER — TRAMADOL HCL 50 MG PO TABS: 50.0000 mg | ORAL_TABLET | Freq: Four times a day (QID) | ORAL | Status: AC | PRN

## 2013-10-25 MED ORDER — SODIUM CHLORIDE 1 G PO TABS
1.0000 g | ORAL_TABLET | Freq: Two times a day (BID) | ORAL | Status: DC
  Administered 2013-10-25: 1 g via ORAL
  Filled 2013-10-25 (×2): qty 1

## 2013-10-25 MED ORDER — LEVOTHYROXINE SODIUM 150 MCG PO TABS: 150.0000 ug | ORAL_TABLET | Freq: Every day | ORAL | Status: DC

## 2013-10-25 MED ORDER — SODIUM CHLORIDE 1 G PO TABS: 1.0000 g | ORAL_TABLET | Freq: Two times a day (BID) | ORAL | Status: DC

## 2013-10-25 MED ORDER — AMLODIPINE BESYLATE 10 MG PO TABS
10.0000 mg | ORAL_TABLET | Freq: Every day | ORAL | Status: DC
  Administered 2013-10-25: 10 mg via ORAL
  Filled 2013-10-25: qty 1

## 2013-10-25 NOTE — Progress Notes (Signed)
Report given to Elsie Lincoln , RN [facility].

## 2013-10-25 NOTE — Progress Notes (Signed)
Patient had an assisted fall, patient was trying to get into the bedside commode, a student saw her and when student was trying to help her into the bedside commode the patient started to urinate , student then called for more help and just help patient sit on the floor . Charge nurse made aware as well as PA. Patient was checked and no change noted from previous assessment.

## 2013-10-25 NOTE — Progress Notes (Signed)
Son Shikara Mcauliffe made aware of the assisted fall.

## 2013-10-25 NOTE — Progress Notes (Signed)
Patient is doing the best that I have seen.  May be ready for SNF placement.  Na+ up to 127.    This patient has been seen and I agree with the findings and treatment plan.  Marta Lamas. Gae Bon, MD, FACS [phone] (pager) [phone] (direct pager) Trauma Surgeon

## 2013-10-25 NOTE — Clinical Social Work Note (Signed)
Clinical Social Worker facilitated patient discharge including contacting patient family and facility to confirm patient discharge plans.  Clinical information faxed to facility and family agreeable with plan.  CSW arranged ambulance transport via [facility] to [facility].  RN to call report prior to discharge.  Clinical Social Worker will sign off for now as social work intervention is no longer needed. Please consult us again if new need arises.  Jesse Aliyana Dlugosz, LCSW [phone] 

## 2013-10-25 NOTE — Progress Notes (Signed)
  Imp/Rec 1-Hyponatremia: continue to improve.  Likely multifactorial: hypotonic IVF on admission (which was stopped several days ago), thiazide diuretic and hypothyroid state (TSH high at 7).   -continue sodium tablets BID for 1 week -avoid Thiazides  -make sure patient keep herself well hydrated and with good PO intake -BMET in 1 week -repeat TSH and free T4 in 4 weeks -on 10/25/13 Na 127 and climbing  2-Hypokalemia corrected. K and Mg WNL at discharge  3-Abnormal EKG reading prolonged QT.  likely errooneous, since q waves barely visible.  reviewed with cardiology, who agrees.  Nevertheless, patient received 2 gm mag to keep closer to 2. Repeated EKG WNL    4-Aortic stenosis: stable. Continue follow up with cardiology as an outpatient  5-Dementia: at baseline. Continue supportive care  6-Multiple ribs fracture: will continue analgesic therapy and physical reconditioning.  7-HTN: will increase amlodipine to 10mg  daily, since HCTZ discontinued.  -follow up with PCP in 1 week to reassess and to adjust medications as needed -pain contributing. -continue pain control  Subjective: No fever, no nausea or vomiting. Still with some costal pain, especially with movements.  Objective: Vital signs in last 24 hours: Temp:  [97.9 F (36.6 C)-98.6 F (37 C)] 98.3 F (36.8 C) (11/05 1445) Pulse Rate:  [64-91] 91 (11/05 1445) Resp:  [16-18] 18 (11/05 1445) BP: (146-181)/(60-78) 181/74 mmHg (11/05 1445) SpO2:  [93 %-100 %] 93 % (11/05 1445) Weight change:  Last BM Date: 10/24/13  Intake/Output from previous day: 11/04 0701 - 11/05 0700 In: 626.8 [I.V.:576.8; IV Piggyback:50] Out: -  Intake/Output this shift:   Gen:  AAOX2, no fever. Appears weak. Confused. Lungs: CTA bilaterally CV: RRR, with harsh systolic murmur Abd: S, NT, ND Ext: no CCE  BMET  Recent Labs  10/24/13 1815 10/25/13 0510  NA 125* 127*  K 4.0 3.6  CL 90* 91*  CO2 27 29  GLUCOSE 126* 88  BUN 7 8   CREATININE 0.37* 0.42*  CALCIUM 7.9* 8.2*   Serum osm 240 Urine osm 539 Urine Na+59  Studies/Results: No results found.  Medications:  Scheduled Meds: . acetaminophen  650 mg Oral Q8H  . amLODipine  10 mg Oral Daily  . balsalazide  1,500 mg Oral TID  . digoxin  125 mcg Oral Daily  . docusate sodium  200 mg Oral BID  . enoxaparin (LOVENOX) injection  40 mg Subcutaneous Q24H  . feeding supplement (RESOURCE BREEZE)  1 Container Oral TID BM  . levothyroxine  150 mcg Oral QAC breakfast  . senna  1 tablet Oral Daily  . simvastatin  10 mg Oral QHS  . sodium chloride  1 g Oral BID WC  . traMADol  100 mg Oral Q6H   Continuous Infusions:   PRN Meds:.bisacodyl, HYDROcodone-acetaminophen, morphine injection, ondansetron (ZOFRAN) IV, ondansetron, sodium chloride   LOS: 10 days   Yarel Rushlow [phone] 10/25/2013, 4:51 PM

## 2013-10-25 NOTE — Progress Notes (Signed)
Patient ID: Savannah Todd, female   DOB: [birth year], 79 y.o.   MRN: [id number]  LOS: 10 days   Subjective: Patient sitting in chair, alert and oriented to self and place "I'm in a part of the hospital".  Denies pain.    Objective: Vital signs in last 24 hours: Temp:  [97.9 F (36.6 C)-98.1 F (36.7 C)] 98.1 F (36.7 C) (11/05 0559) Pulse Rate:  [64-66] 64 (11/05 0559) Resp:  [16-18] 16 (11/05 0559) BP: (146-175)/(60-78) 175/78 mmHg (11/05 0559) SpO2:  [95 %-96 %] 96 % (11/05 0559) Last BM Date: 10/24/13  Lab Results:  CBC No results found for this basename: WBC, HGB, HCT, PLT,  in the last 72 hours BMET  Recent Labs  10/24/13 1815 10/25/13 0510  NA 125* 127*  K 4.0 3.6  CL 90* 91*  CO2 27 29  GLUCOSE 126* 88  BUN 7 8  CREATININE 0.37* 0.42*  CALCIUM 7.9* 8.2*    Imaging: [facility] Chest Port 1-view 10-22-13 IMPRESSION:  Status post central line placement without evidence of pneumothorax.  Otherwise stable chest.   PE: General appearance: alert, cooperative and no distress Neuro: Alert to self and place, follows commands Resp: clear to auscultation bilaterally, IS 250-500 Cardio: S1, S2 normal GI: soft, non-tender; bowel sounds normal; no masses,  no organomegaly Pulses: 2+ and symmetric Skin: Generalized bruising   Assessment/Plan: has Aortic stenosis; Dementia, in, senility; Hypothyroidism; Irritable bowel syndrome; Benign hypertensive heart disease without heart failure; Hypercholesterolemia; History of recent fall; Person living in residential institution; Fall with injury; Fall; Multiple fractures of ribs of right side; Pneumothorax, traumatic; Compression fracture of L1 lumbar vertebra; Compression fracture of L2; Acute blood loss anemia; Hypokalemia; and Hyponatremia on her problem list.  Patient Active Problem List   Diagnosis Date Noted  . Hypokalemia 10/22/2013  . Hyponatremia 10/22/2013  . Fall 10/17/2013  . Multiple fractures of ribs of right side  10/17/2013  . Pneumothorax, traumatic 10/17/2013  . Compression fracture of L1 lumbar vertebra 10/17/2013  . Compression fracture of L2 10/17/2013  . Acute blood loss anemia 10/17/2013  . Fall with injury 10/16/2013  . History of recent fall 07/27/2013  . Person living in residential institution 07/27/2013  . Aortic stenosis 06/05/2011  . Dementia, in, senility 06/05/2011  . Hypothyroidism 06/05/2011  . Irritable bowel syndrome 06/05/2011  . Benign hypertensive heart disease without heart failure 06/05/2011  . Hypercholesterolemia 06/05/2011   Fall- Continue PT/OT Right rib fx's and ptx- Stable, continue pulmonary toilet Hyponatremia - Resolving, AM NA+ 127, NA+ tablets were dc'd yesterday by Dr. Lendell Caprice Hypokalemia- Resolved VTE - SCD's & Lovenox, OOB to chair   FEN - NSL, Regular diet Dispo- Stable from a trauma standpoint   Norm Parcel, NP Student General Trauma PA Pager: [phone]

## 2013-10-25 NOTE — Discharge Summary (Signed)
Physician Discharge Summary  Patient ID: Savannah Todd MRN: [id number] DOB/AGE: [birth year] 77 y.o.  Admit date: 10/15/2013 Discharge date: 10/25/2013  Discharge Diagnoses Patient Active Problem List   Diagnosis Date Noted  . Hypokalemia 10/22/2013  . Hyponatremia 10/22/2013  . Fall 10/17/2013  . Multiple fractures of ribs of right side 10/17/2013  . Pneumothorax, traumatic 10/17/2013  . Compression fracture of L1 lumbar vertebra 10/17/2013  . Compression fracture of L2 10/17/2013  . Acute blood loss anemia 10/17/2013  . Fall with injury 10/16/2013  . History of recent fall 07/27/2013  . Person living in residential institution 07/27/2013  . Aortic stenosis 06/05/2011  . Dementia, in, senility 06/05/2011  . Hypothyroidism 06/05/2011  . Irritable bowel syndrome 06/05/2011  . Benign hypertensive heart disease without heart failure 06/05/2011  . Hypercholesterolemia 06/05/2011    Consultants Dr. Lisbeth Renshaw for neurosurgery  Dr. Ritta Slot for neurology  Dr. Debe Coder for internal medicine   Procedures None   HPI: Savannah Todd is a resident of [facility]. She reportedly fell 2 times the day before admission. At least one time she landed on her right side. She complained of some right sided chest pain. She was brought to the ER where a CT of the chest, abdomen, and pelvis showed a small right sided pneumothorax, right rib fractures, and L1 and L2 compression fractures of uncertain age. She was admitted to the trauma service for pulmonary toilet. Neurosurgery was consulted and recommended lumbar bracing.   Hospital Course: The patient's pneumothorax did not worsen and her pain was controlled adequately. She was found incidentally on admission to have evidence of a UTI by her urinalysis and was treated appropriately. She had a mild acute blood loss anemia that did not require any transfusion. Even though she came into the hospital with some baseline dementia  and confusion it seemed to steadily worsen while she was here. A workup was undertaken and neurology was consulted. She was found to have severe hyponatremia that did not respond quickly to our first interventions and so internal medicine was consulted. Under their direction her sodium slowly rose and her confusion improved. The etiology was thought to be multifactorial. Once her sodium had crested 125 she was deemed safe to return to her skilled nursing facility and was transferred there in stable condition.      Medication List    STOP taking these medications       hydrochlorothiazide 25 MG tablet  Commonly known as:  HYDRODIURIL     HYDROcodone-acetaminophen 5-325 MG per tablet  Commonly known as:  NORCO/VICODIN      TAKE these medications       acetaminophen 650 MG CR tablet  Commonly known as:  TYLENOL  Take 650 mg by mouth every 8 (eight) hours.     amLODipine 5 MG tablet  Commonly known as:  NORVASC  Take 5 mg by mouth daily.     balsalazide 750 MG capsule  Commonly known as:  COLAZAL  Take 2 capsules (1,500 mg total) by mouth 3 (three) times daily. 3 tablets TID     bisacodyl 10 MG suppository  Commonly known as:  DULCOLAX  Place 10 mg rectally daily as needed for constipation (if constipation not relieved by milk of magnesia).     BREWERS YEAST PO  Take 2 tablets by mouth 3 (three) times daily.     cholecalciferol 400 UNITS Tabs tablet  Commonly known as:  VITAMIN D  Take 400 Units by mouth  daily.     digoxin 0.125 MG tablet  Commonly known as:  LANOXIN  Take 1 tablet (125 mcg total) by mouth daily.     diphenhydrAMINE 25 mg capsule  Commonly known as:  BENADRYL  Take 25 mg by mouth every 6 (six) hours as needed for itching.     feeding supplement (RESOURCE BREEZE) Liqd  Take 1 Container by mouth 3 (three) times daily between meals.     levothyroxine 150 MCG tablet  Commonly known as:  SYNTHROID, LEVOTHROID  Take 1 tablet (150 mcg total) by mouth daily  before breakfast.     MULTIVITAMIN PO  Take 1 tablet by mouth daily.     simvastatin 10 MG tablet  Commonly known as:  ZOCOR  Take 10 mg by mouth at bedtime.     traMADol 50 MG tablet  Commonly known as:  ULTRAM  Take 1-2 tablets (50-100 mg total) by mouth every 6 (six) hours as needed.     TUMS PO  Take 1 tablet by mouth as needed (indigestion).     VIACTIV PO  Take 1 tablet by mouth daily.             Follow-up Information   Follow up with NUNDKUMAR, NEELESH, C, MD. (As needed should low back pain persist over 4-6 weeks)    Specialty:  Neurosurgery   Contact information:   [address], [address] [place] [address] [phone]       Follow up with [facility]. (As needed)    Contact information:   [address] [place] [address] [phone]       Follow up with RIGBY, Damika Harmon, DO In 2 weeks.   Specialty:  Family Medicine   Contact information:   [address] [place] [address] [phone]       Signed: Freeman Caldron, PA-C Pager: [phone] General Trauma PA Pager: [phone]  10/25/2013, 2:56 PM

## 2013-10-25 NOTE — Progress Notes (Signed)
Patient transferred to [facility] living via EMS per strecther. Vital signs 109/73, HR 89 prior to transfer.

## 2013-10-26 ENCOUNTER — Non-Acute Institutional Stay (INDEPENDENT_AMBULATORY_CARE_PROVIDER_SITE_OTHER): Payer: Medicare Other | Admitting: Family Medicine

## 2013-10-26 DIAGNOSIS — E871 Hypo-osmolality and hyponatremia: Secondary | ICD-10-CM

## 2013-10-26 DIAGNOSIS — I359 Nonrheumatic aortic valve disorder, unspecified: Secondary | ICD-10-CM

## 2013-10-26 DIAGNOSIS — I350 Nonrheumatic aortic (valve) stenosis: Secondary | ICD-10-CM

## 2013-10-26 DIAGNOSIS — W19XXXA Unspecified fall, initial encounter: Secondary | ICD-10-CM

## 2013-10-26 DIAGNOSIS — T1490XA Injury, unspecified, initial encounter: Secondary | ICD-10-CM

## 2013-10-26 DIAGNOSIS — F0390 Unspecified dementia without behavioral disturbance: Secondary | ICD-10-CM

## 2013-10-26 NOTE — Discharge Summary (Signed)
Ready for SNF  This patient has been seen and I agree with the findings and treatment plan.  Marta Lamas. Gae Bon, MD, FACS [phone] (pager) [phone] (direct pager) Trauma Surgeon

## 2013-10-26 NOTE — Progress Notes (Signed)
Patient ID: Savannah Todd, female   DOB: [birth year], 79 y.o.   MRN: [id number]  [facility] Admission History and Physical Service Pager: [phone]  Patient name: Savannah Todd Medical record number: [id number] Date of birth: Sep 25, 1920 Age: 79 y.o. Gender: female  Primary Care Provider: Gaspar Bidding, DO Code Status: DNR  Assessment and Plan: Savannah Todd is a 79 y.o. female readmitted to [facility] after hospitalization for falls resulting in rib fractures, spinal compression fractures and pneumothorax. Hospitalization was complicated by profound hyponatremia as well. PMH is significant for dementia, AS, HLD, hypothyroidism and HTN  Falls: Patient doing well since discharge from hospital. She does not have any complaints of pain at this time. She does have multiple healing bruises. She is still a fall risk and this should continue. She is not currently braced. Will con't to work with PT/OT on strength, ROM and pain control. Scheduled Tylenol TID for now. Tramadol prn severe pain but will d/c this if she does not require it.   Blood loss anemia: Stable. Did not require transfusion during hospitalization. Will recheck labs next week.  UTI: Found incidentally on urine in ED. Treated appropriately in hospital and not on antibiotics at this time.   Hyponatremia: Patient with profound confusion during her hospitalization. Labs showed Na of 110, which took several days to correct. On day of discharge, Na was 125. Will need to monitor this closely. Labs next week.  Dementia: Known history of dementia with last MMSE reported to be 23/30. Patient did not fully participate on my exam today. ?delrium vs. Worsening dementia vs. Electrolyte imbalance. She was only able to tell me her last name is Sando and she is in a nursing home. Does not know first name, city, month, year. Will monitor this closely. She does not appear to be on Aricept or Namenda, which can be discussed with  family and nursing home team.   Hypothyroidism: TSH elevated recently. Levothyroxine has been adjusted to 150 mcg, and will recheck TSH in next several weeks as previously planned. No changes at this time.  Hypertension: BP stable. HCTZ d/c at discharge from hospital. Monitor pressures on Norvasc 5mg . Con't digoxin.  FEN/GI: Regular diet  Disposition: Readmit to skilled services.  History of Present Illness: Savannah Todd is a 79 y.o. female readmitted for skilled nursing after she sustained 2 falls on 10/14/13 at [facility] which resulted in multiple rib fractures, compression fractures and small pneumothorax. Much improved and pain well controlled, so patient returned to SNF on evening of 09/24/13.  Review Of Systems: Per HPI with the following additions: None Otherwise 12 point review of systems was performed and was unremarkable.  Patient Active Problem List   Diagnosis Date Noted  . Hypokalemia 10/22/2013  . Hyponatremia 10/22/2013  . Fall 10/17/2013  . Multiple fractures of ribs of right side 10/17/2013  . Pneumothorax, traumatic 10/17/2013  . Compression fracture of L1 lumbar vertebra 10/17/2013  . Compression fracture of L2 10/17/2013  . Acute blood loss anemia 10/17/2013  . Fall with injury 10/16/2013  . History of recent fall 07/27/2013  . Person living in residential institution 07/27/2013  . Aortic stenosis 06/05/2011  . Dementia, in, senility 06/05/2011  . Hypothyroidism 06/05/2011  . Irritable bowel syndrome 06/05/2011  . Benign hypertensive heart disease without heart failure 06/05/2011  . Hypercholesterolemia 06/05/2011   Past Medical History: Past Medical History  Diagnosis Date  . Thyroid disease     hypothyroidism  . Hypertension   .  IBS (irritable bowel syndrome)   . Aortic stenosis   . Memory disorder     mild  . Hyperlipidemia   . GERD (gastroesophageal reflux disease)   . Ulcerative colitis   . Arthritis   . Anxiety    Past Surgical  History: Past Surgical History  Procedure Laterality Date  . Esophagogastroduodenoscopy  01/30/1993    with biopsy/second duodenum normal/mucosa normal/no ulcerations or any inflammatory activity/no evidence of extrinsic compression or active inflammation/fundus & cardia normal/2 to 3cm hiatal hernia/proximal esophagus normal  . Esophagogastroduodenoscopy  07/18/1991    ampulla normal/pyloric channel,prepyloric area, antrum & body normal/fundus & cardia normal/distal & proximal esophagus normal/  . Abdominal hysterectomy     Social History: History  Substance Use Topics  . Smoking status: Never Smoker   . Smokeless tobacco: Not on file  . Alcohol Use: No   Additional social history: Sons are Dr. Stephani Police [phone]), Hermine Messick [phone]) and Cearra Portnoy [phone])  Please also refer to relevant sections of EMR.  Family History: Family History  Problem Relation Age of Onset  . Heart disease    . Cancer Mother   . Heart disease Father    Allergies and Medications: Allergies  Allergen Reactions  . Niaspan [Niacin Er] Itching  . Bactrim Nausea Only  . Benazepril Hcl Cough  . Naprosyn [Naproxen] Itching   Current Outpatient Prescriptions on File Prior to Visit  Medication Sig Dispense Refill  . acetaminophen (TYLENOL) 650 MG CR tablet Take 650 mg by mouth every 8 (eight) hours.      Marland Kitchen amLODipine (NORVASC) 10 MG tablet Take 1 tablet (10 mg total) by mouth daily.      . balsalazide (COLAZAL) 750 MG capsule Take 2 capsules (1,500 mg total) by mouth 3 (three) times daily. 3 tablets TID      . bisacodyl (DULCOLAX) 10 MG suppository Place 10 mg rectally daily as needed for constipation (if constipation not relieved by milk of magnesia).      Janene Madeira YEAST PO Take 2 tablets by mouth 3 (three) times daily.        . Calcium Carbonate Antacid (TUMS PO) Take 1 tablet by mouth as needed (indigestion).       . Calcium-Vitamin D-Vitamin K (VIACTIV PO) Take 1 tablet by mouth daily.        . cholecalciferol (VITAMIN D) 400 UNITS TABS tablet Take 400 Units by mouth daily.      . digoxin (LANOXIN) 0.125 MG tablet Take 1 tablet (125 mcg total) by mouth daily.  30 tablet  5  . diphenhydrAMINE (BENADRYL) 25 mg capsule Take 25 mg by mouth every 6 (six) hours as needed for itching.      . feeding supplement, RESOURCE BREEZE, (RESOURCE BREEZE) LIQD Take 1 Container by mouth 3 (three) times daily between meals.    0  . levothyroxine (SYNTHROID, LEVOTHROID) 150 MCG tablet Take 1 tablet (150 mcg total) by mouth daily before breakfast.      . Multiple Vitamin (MULTIVITAMIN PO) Take 1 tablet by mouth daily.        . simvastatin (ZOCOR) 10 MG tablet Take 10 mg by mouth at bedtime.      . sodium chloride 1 G tablet Take 1 tablet (1 g total) by mouth 2 (two) times daily with a meal. Take medications BID for 1 week      . traMADol (ULTRAM) 50 MG tablet Take 1-2 tablets (50-100 mg total) by mouth every 6 (six) hours as  needed.  24 tablet  0   No current facility-administered medications on file prior to visit.    Objective: Nursing home vital signs reviewed Exam: General: Sitting up in wheelchair, awake in NAD HEENT: MMM Cardiovascular: Loud blowing systolic murmur Respiratory: Good effort, CTA anterior and lateral ascultation Abdomen: Soft, nontender Extremities: 1+ pitting edema of ankles Skin: Diffuse bruising. Most notable on left neck, upper extremities. Has healing abrasion to right cheek lateral to nose Neuro: Oriented to her last name and "nursing home." Attempted to participate with exam but was not contributory. Follows directions. Good grip bilaterally.    Labs and Imaging: CBC BMET   Recent Labs Lab 10/21/13 0505  WBC 13.9*  HGB 11.2*  HCT 30.8*  PLT 283    Recent Labs Lab 10/25/13 0510  NA 127*  K 3.6  CL 91*  CO2 29  BUN 8  CREATININE 0.42*  GLUCOSE 88  CALCIUM 8.2*     Ulyana Pitones Nydia Bouton, MD 10/26/2013, 3:03 PM PGY-3, [facility]  Intern pager: [phone], text pages welcome

## 2013-10-30 ENCOUNTER — Encounter: Payer: Self-pay | Admitting: Internal Medicine

## 2013-10-30 DIAGNOSIS — S2242XD Multiple fractures of ribs, left side, subsequent encounter for fracture with routine healing: Secondary | ICD-10-CM | POA: Insufficient documentation

## 2013-10-30 NOTE — Assessment & Plan Note (Signed)
Seen by neuro surg who recommended brace for comfort;pt has pain meds;not certain this was old or new

## 2013-10-30 NOTE — Assessment & Plan Note (Signed)
Agree will have to be careful with BP not to decrease preload with AS

## 2013-10-30 NOTE — Assessment & Plan Note (Signed)
Currently on no meds

## 2013-10-30 NOTE — Assessment & Plan Note (Signed)
De veloped oin hospital with hyponatremia-corrected prior to d/c

## 2013-10-30 NOTE — Progress Notes (Signed)
MRN: [id number] Name: Savannah Todd  Sex: female Age: 79 y.o. DOB: 11/27/1920  PSC #: Sonny Dandy Facility/Room: 309 Level Of Care: SNF Provider: Merrilee Seashore D Emergency Contacts: Extended Emergency Contact Information Primary Emergency Contact: Zellars,[place] Address: [address]          [place] [address] [place] Home Phone: [phone] Relation: Son Secondary Emergency Contact: Bonna Gains [place] Mobile Phone: [phone] Relation: Son  Code Status: DNR  Allergies: Niaspan; Bactrim; Benazepril hcl; and Naprosyn  Chief Complaint  Patient presents with  . nursing home admission    HPI: Patient is 79 y.o. female who fell and has multiple bilateral rib fractures, a small pneomothorax, 2 lumbar fx, old vs new and has had hyponatremia in the hospital.  Past Medical History  Diagnosis Date  . Thyroid disease     hypothyroidism  . Hypertension   . IBS (irritable bowel syndrome)   . Aortic stenosis   . Memory disorder     mild  . Hyperlipidemia   . GERD (gastroesophageal reflux disease)   . Ulcerative colitis   . Arthritis   . Anxiety     Past Surgical History  Procedure Laterality Date  . Esophagogastroduodenoscopy  01/30/1993    with biopsy/second duodenum normal/mucosa normal/no ulcerations or any inflammatory activity/no evidence of extrinsic compression or active inflammation/fundus & cardia normal/2 to 3cm hiatal hernia/proximal esophagus normal  . Esophagogastroduodenoscopy  07/18/1991    ampulla normal/pyloric channel,prepyloric area, antrum & body normal/fundus & cardia normal/distal & proximal esophagus normal/  . Abdominal hysterectomy        Medication List    Notice   This visit is during an admission. Changes to the med list made in this visit will be reflected in the After Visit Summary of the admission.      No orders of the defined types were placed in this encounter.     There is no  immunization history on file for this patient.  History  Substance Use Topics  . Smoking status: Never Smoker   . Smokeless tobacco: Not on file  . Alcohol Use: No    Family history is noncontributory    Review of Systems  DATA OBTAINED: from patient GENERAL: Feels well no fevers, fatigue, appetite changes SKIN: No itching, rash or wounds EYES: No eye pain, redness, discharge EARS: No earache, tinnitus, change in hearing NOSE: No congestion, drainage or bleeding  MOUTH/THROAT: No mouth or tooth pain, No sore throat, No difficulty chewing or swallowing  RESPIRATORY: No cough, wheezing, SOB CARDIAC: No chest pain, palpitations, GI: No abdominal pain, No N/V/D or constipation, No heartburn or reflux  GU: No dysuria, frequency or urgency, or incontinence  MUSCULOSKELETAL: No unrelieved bone/joint pain NEUROLOGIC: No headache, dizziness or focal weakness PSYCHIATRIC: No overt anxiety or sadness. Sleeps well. No behavior issue.   Filed Vitals:   10/30/13 0915  BP: 152/73  Pulse: 78  Temp: 98.6 F (37 C)  Resp: 18    Physical Exam  GENERAL APPEARANCE: Alert, conversant. Appropriately groomed. No acute distress.  SKIN: No diaphoresis rash, or wounds HEAD: Normocephalic, atraumatic  EYES: Conjunctiva/lids clear. Pupils round, reactive. EOMs intact.  EARS: External exam WNL, canals clear. Hearing grossly normal.  NOSE: No deformity or discharge.  MOUTH/THROAT: Lips w/o lesions. Mouth and throat normal. Tongue moist, w/o lesion.  NECK: No thyroid tenderness, enlargement or nodule  RESPIRATORY: Breathing is even, unlabored. Lung sounds are clear   CARDIOVASCULAR: Heart RRR no murmurs,  rubs or gallops. No peripheral edema.  ARTERIAL: radial pulse 2+, DP pulse 1+  VENOUS: No varicosities. No venous stasis skin changes  GASTROINTESTINAL: Abdomen is soft, non-tender, not distended w/ normal bowel sounds. No mass, ventral or inguinal hernia. No organomegally GENITOURINARY: Bladder  non tender, not distended  MUSCULOSKELETAL: No abnormal joints or musculature NEUROLOGIC: Oriented X3. Cranial nerves 2-12 grossly intact. Moves all extremities no tremor. PSYCHIATRIC: Mood and affect appropriate to situation, no behavioral issues  Patient Active Problem List   Diagnosis Date Noted  . Multiple fractures of ribs of left side with routine healing 10/30/2013  . Hypokalemia 10/22/2013  . Hyponatremia 10/22/2013  . Fall 10/17/2013  . Multiple fractures of ribs of right side 10/17/2013  . Pneumothorax, traumatic 10/17/2013  . Compression fracture of L1 lumbar vertebra 10/17/2013  . Compression fracture of L2 10/17/2013  . Acute blood loss anemia 10/17/2013  . Fall with injury 10/16/2013  . History of recent fall 07/27/2013  . Person living in residential institution 07/27/2013  . Aortic stenosis 06/05/2011  . Dementia, in, senility 06/05/2011  . Hypothyroidism 06/05/2011  . Irritable bowel syndrome 06/05/2011  . Benign hypertensive heart disease without heart failure 06/05/2011  . Hypercholesterolemia 06/05/2011    CBC    Component Value Date/Time   WBC 13.9* 10/21/2013 0505   WBC 7.6 07/20/2013 1510   RBC 3.80* 10/21/2013 0505   RBC 4.76 07/20/2013 1510   HGB 11.2* 10/21/2013 0505   HGB 14.3 07/20/2013 1510   HCT 30.8* 10/21/2013 0505   HCT 44.2 07/20/2013 1510   PLT 283 10/21/2013 0505   MCV 81.1 10/21/2013 0505   MCV 92.8 07/20/2013 1510   LYMPHSABS 1.7 10/15/2013 0634   MONOABS 1.7* 10/15/2013 0634   EOSABS 0.0 10/15/2013 0634   BASOSABS 0.0 10/15/2013 0634    CMP     Component Value Date/Time   NA 127* 10/25/2013 0510   K 3.6 10/25/2013 0510   CL 91* 10/25/2013 0510   CO2 29 10/25/2013 0510   GLUCOSE 88 10/25/2013 0510   BUN 8 10/25/2013 0510   CREATININE 0.42* 10/25/2013 0510   CREATININE 0.89 07/20/2013 1508   CALCIUM 8.2* 10/25/2013 0510   PROT 7.8 07/20/2013 1508   ALBUMIN 4.6 07/20/2013 1508   AST 21 07/20/2013 1508   ALT 14 07/20/2013 1508   ALKPHOS 79  07/20/2013 1508   BILITOT 0.4 07/20/2013 1508   GFRNONAA 87* 10/25/2013 0510   GFRAA >90 10/25/2013 0510    Assessment and Plan  Pneumothorax, traumatic Py was noted to have a small pneumo on the right which did not worsen or require surgical intervention  Multiple fractures of ribs of right side Displaced rib fx bilateral - L post 12 and 11; on R rib 9 ;severely displaced fx post/lateral 8th; SEVERELY displaced lateral 7 which projects 1 cm into R lung parenchyma; displaced lateral 6; in hospital had pulm toilet, here pain medications  Multiple fractures of ribs of left side with routine healing See under right ribs  Compression fracture of L1 lumbar vertebra Seen by neuro surg who recommended brace for comfort;pt has pain meds;not certain this was old or new  Compression fracture of L2 Not sure if old or new;see under L1  Hyponatremia Discovered in hospital by worsening dementia Na 113, felt to be multifactoral and corrected up to 125  Hypokalemia De veloped oin hospital with hyponatremia-corrected prior to d/c  Acute blood loss anemia Will recheck to make sure stable  Dementia, in, senility Currently  on no meds   Hypothyroidism Will continue replacement  Benign hypertensive heart disease without heart failure Amlodipine, HCTZ, dig; h/o of intermiddently elevated BP ;will watch  Aortic stenosis Agree will have to be careful with BP not to decrease preload with AS  Hypercholesterolemia Continue Zocor  UTI - incidentally found in hospital and treated  Margit Hanks, MD    This encounter was created in error - please disregard.

## 2013-10-30 NOTE — Assessment & Plan Note (Signed)
Will recheck to make sure stable

## 2013-10-30 NOTE — Assessment & Plan Note (Signed)
Not sure if old or new;see under L1

## 2013-10-30 NOTE — Assessment & Plan Note (Signed)
See under right ribs

## 2013-10-30 NOTE — Assessment & Plan Note (Signed)
Displaced rib fx bilateral - L post 12 and 11; on R rib 9 ;severely displaced fx post/lateral 8th; SEVERELY displaced lateral 7 which projects 1 cm into R lung parenchyma; displaced lateral 6; in hospital had pulm toilet, here pain medications

## 2013-10-30 NOTE — Assessment & Plan Note (Signed)
Discovered in hospital by worsening dementia Na 113, felt to be multifactoral and corrected up to 125

## 2013-10-30 NOTE — Assessment & Plan Note (Signed)
Will continue replacement

## 2013-10-30 NOTE — Assessment & Plan Note (Signed)
Py was noted to have a small pneumo on the right which did not worsen or require surgical intervention

## 2013-10-30 NOTE — Assessment & Plan Note (Signed)
Amlodipine, HCTZ, dig; h/o of intermiddently elevated BP ;will watch

## 2013-10-30 NOTE — Assessment & Plan Note (Signed)
Continue Zocor 

## 2013-11-03 ENCOUNTER — Non-Acute Institutional Stay: Payer: Medicare Other | Admitting: Family Medicine

## 2013-11-03 DIAGNOSIS — Z9181 History of falling: Secondary | ICD-10-CM | POA: Diagnosis not present

## 2013-11-03 DIAGNOSIS — F0390 Unspecified dementia without behavioral disturbance: Secondary | ICD-10-CM

## 2013-11-03 DIAGNOSIS — E871 Hypo-osmolality and hyponatremia: Secondary | ICD-10-CM | POA: Diagnosis not present

## 2013-11-03 DIAGNOSIS — Z593 Problems related to living in residential institution: Secondary | ICD-10-CM

## 2013-11-03 NOTE — Assessment & Plan Note (Signed)
Improved at time of hospital discharge. Repeat BMP pending.

## 2013-11-03 NOTE — Assessment & Plan Note (Signed)
Currently stable. Appreciate neurology recommendations.

## 2013-11-03 NOTE — Assessment & Plan Note (Signed)
Appears stable. Patient currently asymptomatic. Monitor clinically

## 2013-11-03 NOTE — Assessment & Plan Note (Signed)
Readmitted to [facility] nursing home on 10/25/2013.

## 2013-11-03 NOTE — Assessment & Plan Note (Signed)
Patient has had multiple recent falls in the nursing home. These appear to be multifactorial. -Currently patient is in wheelchair, ambulation only with assistance -Bed alarm in place -Continue PT/OT

## 2013-11-03 NOTE — Assessment & Plan Note (Signed)
Patient's mental status appears to be improving after recent hospitalization. Patient will require repeat MMSE next few weeks to see if she has progressed back to her baseline.

## 2013-11-03 NOTE — Progress Notes (Signed)
Patient ID: Savannah RUNDE, female   DOB: [birth year], 79 y.o.   MRN: [id number] [facility] Nursing Home Admission Note  Patient name: Savannah Todd Medical record number: [id number] Date of birth: December 04, 1920 Age: 79 y.o. Gender: female  Primary Care Provider: Gaspar Bidding, DO  Chief Complaint: 79 year old female readmitted to [facility] nursing home status post discharge from [facility].  History of Present Illness: Savannah Todd is a 79 y.o. year old female recently discharge from [facility] after sustaining a right pneumothorax, multiple right-sided rib fractures, and L1/L2 compression fractures. These injuries were thought to be secondary to a fall at the nursing home. Please refer to resident dictation 10/26/2013 4 history of present illness.  Patient is without complaint today. She is seen and evaluated in her room. No family members are present  Pneumothorax/rib fracture-appears to be healing fairly well, patient declines any shortness of breath, pain is controlled  Compression fractures-patient states that she doesn't have back pain today, currently in wheelchair, currently on scheduled Tylenol and tramadol as needed for pain  Hyponatremia-confusion appears to be improved from her hospitalization however she is not currently at her baseline  Dementia - most recent MMSE score was 23/30, mental status improved from hospitalization  Multiple falls- multifactorial in etiology, she has been found at the side of her bed multiple times without injury, no new bruising or pain, she has not hit her head, no loss of consciousness  Patient Active Problem List   Diagnosis Date Noted  . Multiple fractures of ribs of left side with routine healing 10/30/2013  . Hypokalemia 10/22/2013  . Hyponatremia 10/22/2013  . Fall 10/17/2013  . Multiple fractures of ribs of right side 10/17/2013  . Pneumothorax, traumatic 10/17/2013  . Compression fracture of L1 lumbar  vertebra 10/17/2013  . Compression fracture of L2 10/17/2013  . Acute blood loss anemia 10/17/2013  . Fall with injury 10/16/2013  . History of recent fall 07/27/2013  . Person living in residential institution 07/27/2013  . Aortic stenosis 06/05/2011  . Dementia, in, senility 06/05/2011  . Hypothyroidism 06/05/2011  . Irritable bowel syndrome 06/05/2011  . Benign hypertensive heart disease without heart failure 06/05/2011  . Hypercholesterolemia 06/05/2011   Past Medical History: Past Medical History  Diagnosis Date  . Thyroid disease     hypothyroidism  . Hypertension   . IBS (irritable bowel syndrome)   . Aortic stenosis   . Memory disorder     mild  . Hyperlipidemia   . GERD (gastroesophageal reflux disease)   . Ulcerative colitis   . Arthritis   . Anxiety     Past Surgical History: Past Surgical History  Procedure Laterality Date  . Esophagogastroduodenoscopy  01/30/1993    with biopsy/second duodenum normal/mucosa normal/no ulcerations or any inflammatory activity/no evidence of extrinsic compression or active inflammation/fundus & cardia normal/2 to 3cm hiatal hernia/proximal esophagus normal  . Esophagogastroduodenoscopy  07/18/1991    ampulla normal/pyloric channel,prepyloric area, antrum & body normal/fundus & cardia normal/distal & proximal esophagus normal/  . Abdominal hysterectomy      Social History: History   Social History  . Marital Status: Married    Spouse Name: N/A    Number of Children: 3  . Years of Education: N/A   Social History Main Topics  . Smoking status: Never Smoker   . Smokeless tobacco: Not on file  . Alcohol Use: No  . Drug Use: No  . Sexual Activity: No   Other Topics  Concern  . Not on file   Social History Narrative   Widow.    3 adult children.    Son:  Smt Lokey, [place] [phone]   Hermine Messick [phone]   Catha Brow [phone]    Family History: Family History  Problem Relation Age of Onset  . Heart disease    .  Cancer Mother   . Heart disease Father     Allergies: Allergies  Allergen Reactions  . Niaspan [Niacin Er] Itching  . Bactrim Nausea Only  . Benazepril Hcl Cough  . Naprosyn [Naproxen] Itching    Current Outpatient Prescriptions  Medication Sig Dispense Refill  . acetaminophen (TYLENOL) 650 MG CR tablet Take 650 mg by mouth every 8 (eight) hours.      Marland Kitchen amLODipine (NORVASC) 10 MG tablet Take 1 tablet (10 mg total) by mouth daily.      . balsalazide (COLAZAL) 750 MG capsule Take 2 capsules (1,500 mg total) by mouth 3 (three) times daily. 3 tablets TID      . bisacodyl (DULCOLAX) 10 MG suppository Place 10 mg rectally daily as needed for constipation (if constipation not relieved by milk of magnesia).      Janene Madeira YEAST PO Take 2 tablets by mouth 3 (three) times daily.        . Calcium Carbonate Antacid (TUMS PO) Take 1 tablet by mouth as needed (indigestion).       . Calcium-Vitamin D-Vitamin K (VIACTIV PO) Take 1 tablet by mouth daily.       . cholecalciferol (VITAMIN D) 400 UNITS TABS tablet Take 400 Units by mouth daily.      . digoxin (LANOXIN) 0.125 MG tablet Take 1 tablet (125 mcg total) by mouth daily.  30 tablet  5  . diphenhydrAMINE (BENADRYL) 25 mg capsule Take 25 mg by mouth every 6 (six) hours as needed for itching.      . feeding supplement, RESOURCE BREEZE, (RESOURCE BREEZE) LIQD Take 1 Container by mouth 3 (three) times daily between meals.    0  . levothyroxine (SYNTHROID, LEVOTHROID) 150 MCG tablet Take 1 tablet (150 mcg total) by mouth daily before breakfast.      . Multiple Vitamin (MULTIVITAMIN PO) Take 1 tablet by mouth daily.        . simvastatin (ZOCOR) 10 MG tablet Take 10 mg by mouth at bedtime.      . sodium chloride 1 G tablet Take 1 tablet (1 g total) by mouth 2 (two) times daily with a meal. Take medications BID for 1 week      . traMADol (ULTRAM) 50 MG tablet Take 1-2 tablets (50-100 mg total) by mouth every 6 (six) hours as needed.  24 tablet  0   No  current facility-administered medications for this visit.   Review Of Systems: Per HPI with the following additions: No acute pain, no shortness of breath, no chest pain, no nausea, no vomiting, no diarrhea, bruising present over multiple extremities is improving Otherwise 12 point review of systems was performed and was unremarkable.  Physical Exam:            General: alert, cooperative and appears stated age HEENT: extra ocular movement intact, sclera clear, anicteric, oropharynx clear, no lesions, neck supple with midline trachea and thyroid without masses Heart: RRR, 3/6 SEM is heard Lungs: clear to auscultation, no wheezes or rales and unlabored breathing Abdomen: abdomen is soft without significant tenderness, masses, organomegaly or guarding Extremities: Multiple healing ecchymoses over her extremities,  bilateral 2+ lower extremity edema Skin: Abrasion over right cheek healing well Neurology: Alert and oriented x3, knows who the president is, cranial nerves II through XII intact, moving all extremities  Labs and Imaging: Lab Results  Component Value Date/Time   NA 127* 10/25/2013  5:10 AM   K 3.6 10/25/2013  5:10 AM   CL 91* 10/25/2013  5:10 AM   CO2 29 10/25/2013  5:10 AM   BUN 8 10/25/2013  5:10 AM   CREATININE 0.42* 10/25/2013  5:10 AM   CREATININE 0.89 07/20/2013  3:08 PM   GLUCOSE 88 10/25/2013  5:10 AM   Lab Results  Component Value Date   WBC 13.9* 10/21/2013   HGB 11.2* 10/21/2013   HCT 30.8* 10/21/2013   MCV 81.1 10/21/2013   PLT 283 10/21/2013    Assessment and Plan: LOXLEY CIBRIAN is a 79 y.o. year old female admitted to [facility] after recent admission to [facility] for right-sided pneumothorax, multiple rib fractures, and L1/L2 compression fracture.  Please see problem specific assessment and plan. I personally reviewed recent resident note and agree with the assessment and plan as documented.

## 2013-11-03 NOTE — Assessment & Plan Note (Signed)
Currently stable. Appreciate neurology recommendation.

## 2013-11-08 LAB — BASIC METABOLIC PANEL
BUN: 15 mg/dL (ref 4–21)
Creatinine: 0.6 mg/dL (ref 0.5–1.1)
Potassium: 4.8 mmol/L (ref 3.4–5.3)
Sodium: 136 mmol/L — AB (ref 137–147)

## 2013-11-09 ENCOUNTER — Encounter: Payer: Self-pay | Admitting: Emergency Medicine

## 2013-11-13 ENCOUNTER — Encounter: Payer: Self-pay | Admitting: Pharmacist

## 2013-11-24 ENCOUNTER — Non-Acute Institutional Stay (INDEPENDENT_AMBULATORY_CARE_PROVIDER_SITE_OTHER): Payer: Medicare Other | Admitting: Sports Medicine

## 2013-11-24 DIAGNOSIS — I350 Nonrheumatic aortic (valve) stenosis: Secondary | ICD-10-CM

## 2013-11-24 DIAGNOSIS — Z9181 History of falling: Secondary | ICD-10-CM

## 2013-11-24 DIAGNOSIS — E039 Hypothyroidism, unspecified: Secondary | ICD-10-CM

## 2013-11-24 DIAGNOSIS — S32010D Wedge compression fracture of first lumbar vertebra, subsequent encounter for fracture with routine healing: Secondary | ICD-10-CM

## 2013-11-24 DIAGNOSIS — Z5189 Encounter for other specified aftercare: Secondary | ICD-10-CM

## 2013-11-24 DIAGNOSIS — S270XXD Traumatic pneumothorax, subsequent encounter: Secondary | ICD-10-CM

## 2013-11-24 DIAGNOSIS — IMO0002 Reserved for concepts with insufficient information to code with codable children: Secondary | ICD-10-CM

## 2013-11-24 DIAGNOSIS — I359 Nonrheumatic aortic valve disorder, unspecified: Secondary | ICD-10-CM

## 2013-11-24 DIAGNOSIS — F0390 Unspecified dementia without behavioral disturbance: Secondary | ICD-10-CM

## 2013-11-24 NOTE — Progress Notes (Signed)
[facility] Savannah Todd - 79 y.o. female MRN [id number]  Date of birth: 08/04/1920  CC, HPI, Interval History & ROS  Savannah Todd is seen today at [facility] nursing home for chronic disease management.  She has recently been readmitted from the hospital for fall and found to have pneumonia as well as pneumothorax secondary to rib fractures; as well as compression fractures of L1 and L2.  Overall she reports feeling significantly better.  She denies any specific complaints.  Labs are pending for January 8 including TSH, T3-T4  Her chronic medical conditions include: Aortic stenosis Hypertension Hyperlipidemia  Pt denies chest pain, dyspnea at rest or exertion, PND, stable lower extremity edema  Her digoxin has been stopped by the primary geriatric team.  No palpitations, no known history of atrial fibrillation  Patient denies any facial asymmetry, unilateral weakness, or dysarthria.   frequent falls   reportedly increasingly frequent falls.  Vitamin D has been checked and is 45  Denies any debilitating pain    Pertinent History & Care Coordination  Savannah Todd's major active medical problems include: # Dementia: MMSE of 22, frequent falls due to poor safety awareness # CVD: (Aortic Stenosis, HTN, HLD) - no known CAD, no prior MI or CVA No prior ECHOs  Other Pertinent Med/Surg/Hosp History: # Irritable Bowel Syndrome   Follow up Issues:    History  Smoking status  . Never Smoker   Smokeless tobacco  . Not on file   Health Maintenance Due  Topic  . Tetanus/tdap   . Colonoscopy   . Zostavax   . Pneumococcal Polysaccharide Vaccine Age 79 And Over     Recent Labs  07/20/13 1508 10/20/13 1650 11/27/13  TSH 4.275 7.244* 0.32*     Otherwise past Medical, Surgical, Social, and Family History Reviewed per EMR Medications and Allergies reviewed and all updated if necessary. Objective Findings  VITALS: Filed Vitals:   11/24/13 1200  BP: 127/72  Pulse:  68  Temp: 96.8 F (36 C)  Resp: 20  Weight: 126 lb (57.153 kg)  SpO2: 98%    BP Readings from Last 3 Encounters:  11/24/13 127/72  10/25/13 181/74  10/30/13 152/73   Wt Readings from Last 3 Encounters:  11/24/13 126 lb (57.153 kg)  10/17/13 144 lb 8 oz (65.545 kg)  07/20/13 130 lb 9.6 oz (59.24 kg)     PHYSICAL EXAM: GENERAL:  elderly thin Caucasian  female. In no discomfort; no respiratory distress  PSYCH: alert and appropriate, pleasantly interactive.  Not oriented x4.  Short-term recall is lacking   HNEENT:   CARDIO:  3/6 systolic ejection murmur heard best at the right upper sternal border.  Regular rate and rhythm   LUNGS: CTA B, no wheezes, no crackles  ABDOMEN: +BS, soft, non-tender, no rigidity, no guarding, no masses/hepatosplenomegaly  EXTREM:  Warm, well perfused.  Moves all 4 extremities spontaneously; no lateralization.  No noted foot lesions.  Distal pulses 2+/4.  no pretibial edema. Get up and go test less than 5 seconds   GU:   SKIN:      Previous Medications   ACETAMINOPHEN (TYLENOL) 650 MG CR TABLET    Take 650 mg by mouth every 8 (eight) hours.   AMLODIPINE (NORVASC) 5 MG TABLET    Take 5 mg by mouth daily.   BALSALAZIDE (COLAZAL) 750 MG CAPSULE    Take 2 capsules (1,500 mg total) by mouth 3 (three) times daily. 3 tablets TID   BISACODYL (DULCOLAX) 10 MG  SUPPOSITORY    Place 10 mg rectally daily as needed for constipation (if constipation not relieved by milk of magnesia).   CALCIUM CARBONATE ANTACID (TUMS PO)    Take 1 tablet by mouth as needed (indigestion).    CALCIUM-VITAMIN D-VITAMIN K (VIACTIV PO)    Take 1 tablet by mouth daily.    CHOLECALCIFEROL (VITAMIN D) 400 UNITS TABS TABLET    Take 400 Units by mouth daily.   DIPHENHYDRAMINE (BENADRYL) 25 MG CAPSULE    Take 25 mg by mouth every 6 (six) hours as needed for itching.   FEEDING SUPPLEMENT, RESOURCE BREEZE, (RESOURCE BREEZE) LIQD    Take 1 Container by mouth 3 (three) times daily between meals.    LEVOTHYROXINE (SYNTHROID, LEVOTHROID) 150 MCG TABLET    Take 1 tablet (150 mcg total) by mouth daily before breakfast.   MULTIPLE VITAMIN (MULTIVITAMIN PO)    Take 1 tablet by mouth daily.     SIMVASTATIN (ZOCOR) 10 MG TABLET    Take 10 mg by mouth at bedtime.   Modified Medications   No medications on file   New Prescriptions   No medications on file   Discontinued Medications   DIGOXIN (LANOXIN) 0.125 MG TABLET    Take 1 tablet (125 mcg total) by mouth daily.   HYDROCODONE-ACETAMINOPHEN (NORCO/VICODIN) 5-325 MG PER TABLET    Take 1 tablet by mouth every 6 (six) hours as needed for moderate pain (pain).  No orders of the defined types were placed in this encounter.    Assessment & Plan  For further discussion of A/P and for follow up issues see problem based charting.

## 2013-11-27 LAB — TSH: TSH: 0.32 u[IU]/mL — AB (ref 0.41–5.90)

## 2013-11-29 ENCOUNTER — Encounter: Payer: Self-pay | Admitting: Emergency Medicine

## 2013-11-30 DIAGNOSIS — S32009A Unspecified fracture of unspecified lumbar vertebra, initial encounter for closed fracture: Secondary | ICD-10-CM | POA: Diagnosis not present

## 2013-12-04 ENCOUNTER — Encounter: Payer: Self-pay | Admitting: Sports Medicine

## 2013-12-04 NOTE — Assessment & Plan Note (Signed)
No respiratory compromise at this time.  Oxygen saturation is 98%. Continue to monitor

## 2013-12-04 NOTE — Assessment & Plan Note (Signed)
Poor safety awareness. Has a bed alarm and reminded to ask for assistance

## 2013-12-04 NOTE — Assessment & Plan Note (Signed)
Is now off of digoxin. Continue to monitor.

## 2013-12-04 NOTE — Assessment & Plan Note (Signed)
Vitamin D adequate.  On calcium and vitamin D supplementation.

## 2013-12-04 NOTE — Assessment & Plan Note (Signed)
Will plan for repeat MMSE at next visit. > Consider dementia medication

## 2013-12-04 NOTE — Assessment & Plan Note (Signed)
On Synthroid 150 mcg daily. Labs pending

## 2013-12-28 ENCOUNTER — Non-Acute Institutional Stay: Payer: Medicare Other | Admitting: Family Medicine

## 2013-12-28 DIAGNOSIS — I119 Hypertensive heart disease without heart failure: Secondary | ICD-10-CM

## 2013-12-28 DIAGNOSIS — S270XXD Traumatic pneumothorax, subsequent encounter: Secondary | ICD-10-CM

## 2013-12-28 DIAGNOSIS — Z5189 Encounter for other specified aftercare: Secondary | ICD-10-CM | POA: Diagnosis not present

## 2013-12-28 DIAGNOSIS — R609 Edema, unspecified: Secondary | ICD-10-CM

## 2013-12-28 DIAGNOSIS — E039 Hypothyroidism, unspecified: Secondary | ICD-10-CM

## 2013-12-28 DIAGNOSIS — I359 Nonrheumatic aortic valve disorder, unspecified: Secondary | ICD-10-CM

## 2013-12-28 DIAGNOSIS — S32010A Wedge compression fracture of first lumbar vertebra, initial encounter for closed fracture: Secondary | ICD-10-CM

## 2013-12-28 DIAGNOSIS — S32009A Unspecified fracture of unspecified lumbar vertebra, initial encounter for closed fracture: Secondary | ICD-10-CM

## 2013-12-28 DIAGNOSIS — E871 Hypo-osmolality and hyponatremia: Secondary | ICD-10-CM

## 2013-12-28 DIAGNOSIS — I350 Nonrheumatic aortic (valve) stenosis: Secondary | ICD-10-CM

## 2013-12-28 DIAGNOSIS — F0390 Unspecified dementia without behavioral disturbance: Secondary | ICD-10-CM

## 2013-12-28 DIAGNOSIS — R600 Localized edema: Secondary | ICD-10-CM | POA: Insufficient documentation

## 2013-12-28 MED ORDER — LEVOTHYROXINE SODIUM 125 MCG PO TABS: 125.0000 ug | ORAL_TABLET | Freq: Every day | ORAL | Status: DC

## 2013-12-28 NOTE — Assessment & Plan Note (Signed)
Patient currently without symptoms. Physical exam unremarkable. Continue to monitor

## 2013-12-28 NOTE — Assessment & Plan Note (Signed)
Appears stable. Agree with resident note for repeat MSSE and next visit

## 2013-12-28 NOTE — Progress Notes (Signed)
   Subjective:    Patient ID: Savannah Todd, female    DOB: 09-19-1920, 80 y.o.   MRN: [id number]  HPI 80 year old female with past medical history of dementia, hypothyroidism, hypertension, aortic stenosis, history of multiple falls and subsequent rib fracture was seen and evaluated at [facility] on 12/27/2013. Patient is without acute complaint. She denies pain in her extremities or over previous area of rib fracture. Patient states that she is tolerating her diet well, having regular bowel movements. She uses a walker to help with ambulation except when she is in her room. No recent falls identified by nursing staff.   Review of Systems  Constitutional: Negative for fever, chills and fatigue.  HENT: Negative for congestion.   Respiratory: Negative for choking, chest tightness and shortness of breath.   Cardiovascular: Negative for chest pain, palpitations and leg swelling.  Neurological: Negative for dizziness, tremors and syncope.  Psychiatric/Behavioral: Positive for confusion. Negative for behavioral problems and agitation.       Objective:   Physical Exam Vitals: Reviewed General: Pleasant Caucasian female, seen and evaluated in the dining hall, no acute distress HEENT: Normocephalic, pupils equal in size bilaterally, no scleral icterus, moist mucous membranes, neck was supple, no cervical adenopathy Cardiac: Regular rate and rhythm, 3/6 systolic murmur, no heaves or thrills Respiratory: Clear to auscultation bilaterally, normal effort Abdomen: Soft, nontender, bowel sounds present Extremities 1+ edema in bilateral lower extremities, 2+ radial pulses bilaterally  Reviewed recent lab work: Sodium on 11/07/2013 was 136, BMP at that time was within normal limits; TSH performed on 11/27/2013 was low at 0.324     Assessment & Plan:  Please see problem specific assessment and plan.

## 2013-12-28 NOTE — Assessment & Plan Note (Signed)
Repeat BMP within normal limits. Hyponatremia has resolved

## 2013-12-28 NOTE — Assessment & Plan Note (Signed)
Likely multifactorial from venous insufficiency/aortic stenosis/medication related. Will attempt trial off of amlodipine to see if this improves her edema. Continue compression stockings.

## 2013-12-28 NOTE — Assessment & Plan Note (Signed)
Patient's blood pressure remains well-controlled. Will discontinue amlodipine secondary to lower extremity edema. Will monitor blood pressures

## 2013-12-28 NOTE — Assessment & Plan Note (Signed)
Patient had Synthroid dose decreased to 127mcg on 11/29/2013 secondary to low TSH. Repeat TSH scheduled for 12/28/2013. -Will adjust Synthroid based on repeat TSH

## 2013-12-28 NOTE — Assessment & Plan Note (Signed)
Patient is currently asymptomatic without recent falls. Stable off digoxin.

## 2013-12-28 NOTE — Assessment & Plan Note (Signed)
Patient without complaint. Continue calcium and vitamin D.

## 2014-01-09 ENCOUNTER — Encounter: Payer: Self-pay | Admitting: Pharmacist

## 2014-01-16 ENCOUNTER — Telehealth: Payer: Self-pay | Admitting: Sports Medicine

## 2014-01-16 DIAGNOSIS — Z9181 History of falling: Secondary | ICD-10-CM | POA: Diagnosis not present

## 2014-01-16 DIAGNOSIS — I69993 Ataxia following unspecified cerebrovascular disease: Secondary | ICD-10-CM | POA: Diagnosis not present

## 2014-01-16 DIAGNOSIS — R269 Unspecified abnormalities of gait and mobility: Secondary | ICD-10-CM | POA: Diagnosis not present

## 2014-01-16 NOTE — Telephone Encounter (Signed)
[facility] called to let the doctor know that Savannah Todd fell again and she has a small hematoma above her right eyebrow. jw

## 2014-01-16 NOTE — Telephone Encounter (Signed)
I spoke with nurse who states that patient did not have LOC and no concussion-like symptoms. Pt awake and ready to eat dinner. Pt not taking any anti-coagulants or anti-platelet medications. No further work up at this time. Will see pt tomorrow AM.

## 2014-01-17 DIAGNOSIS — R269 Unspecified abnormalities of gait and mobility: Secondary | ICD-10-CM | POA: Diagnosis not present

## 2014-01-17 DIAGNOSIS — I69993 Ataxia following unspecified cerebrovascular disease: Secondary | ICD-10-CM | POA: Diagnosis not present

## 2014-01-17 DIAGNOSIS — Z9181 History of falling: Secondary | ICD-10-CM | POA: Diagnosis not present

## 2014-01-17 LAB — TSH: TSH: 2.2 u[IU]/mL (ref <=5.90)

## 2014-01-18 ENCOUNTER — Encounter: Payer: Self-pay | Admitting: Family Medicine

## 2014-01-18 ENCOUNTER — Other Ambulatory Visit: Payer: Self-pay | Admitting: Family Medicine

## 2014-01-18 DIAGNOSIS — I69993 Ataxia following unspecified cerebrovascular disease: Secondary | ICD-10-CM | POA: Diagnosis not present

## 2014-01-18 DIAGNOSIS — E039 Hypothyroidism, unspecified: Secondary | ICD-10-CM | POA: Diagnosis not present

## 2014-01-18 DIAGNOSIS — Z9181 History of falling: Secondary | ICD-10-CM | POA: Diagnosis not present

## 2014-01-18 DIAGNOSIS — R269 Unspecified abnormalities of gait and mobility: Secondary | ICD-10-CM | POA: Diagnosis not present

## 2014-01-19 DIAGNOSIS — I69993 Ataxia following unspecified cerebrovascular disease: Secondary | ICD-10-CM | POA: Diagnosis not present

## 2014-01-19 DIAGNOSIS — R269 Unspecified abnormalities of gait and mobility: Secondary | ICD-10-CM | POA: Diagnosis not present

## 2014-01-19 DIAGNOSIS — Z9181 History of falling: Secondary | ICD-10-CM | POA: Diagnosis not present

## 2014-01-20 DIAGNOSIS — I69993 Ataxia following unspecified cerebrovascular disease: Secondary | ICD-10-CM | POA: Diagnosis not present

## 2014-01-20 DIAGNOSIS — Z9181 History of falling: Secondary | ICD-10-CM | POA: Diagnosis not present

## 2014-01-20 DIAGNOSIS — R269 Unspecified abnormalities of gait and mobility: Secondary | ICD-10-CM | POA: Diagnosis not present

## 2014-01-22 DIAGNOSIS — Z9181 History of falling: Secondary | ICD-10-CM | POA: Diagnosis not present

## 2014-01-22 DIAGNOSIS — I69993 Ataxia following unspecified cerebrovascular disease: Secondary | ICD-10-CM | POA: Diagnosis not present

## 2014-01-22 DIAGNOSIS — R269 Unspecified abnormalities of gait and mobility: Secondary | ICD-10-CM | POA: Diagnosis not present

## 2014-01-23 DIAGNOSIS — R269 Unspecified abnormalities of gait and mobility: Secondary | ICD-10-CM | POA: Diagnosis not present

## 2014-01-23 DIAGNOSIS — I69993 Ataxia following unspecified cerebrovascular disease: Secondary | ICD-10-CM | POA: Diagnosis not present

## 2014-01-23 DIAGNOSIS — Z9181 History of falling: Secondary | ICD-10-CM | POA: Diagnosis not present

## 2014-01-24 DIAGNOSIS — R269 Unspecified abnormalities of gait and mobility: Secondary | ICD-10-CM | POA: Diagnosis not present

## 2014-01-24 DIAGNOSIS — I69993 Ataxia following unspecified cerebrovascular disease: Secondary | ICD-10-CM | POA: Diagnosis not present

## 2014-01-24 DIAGNOSIS — Z9181 History of falling: Secondary | ICD-10-CM | POA: Diagnosis not present

## 2014-01-25 DIAGNOSIS — Z9181 History of falling: Secondary | ICD-10-CM | POA: Diagnosis not present

## 2014-01-25 DIAGNOSIS — I69993 Ataxia following unspecified cerebrovascular disease: Secondary | ICD-10-CM | POA: Diagnosis not present

## 2014-01-25 DIAGNOSIS — R269 Unspecified abnormalities of gait and mobility: Secondary | ICD-10-CM | POA: Diagnosis not present

## 2014-01-25 DIAGNOSIS — E785 Hyperlipidemia, unspecified: Secondary | ICD-10-CM | POA: Diagnosis not present

## 2014-01-26 DIAGNOSIS — R269 Unspecified abnormalities of gait and mobility: Secondary | ICD-10-CM | POA: Diagnosis not present

## 2014-01-26 DIAGNOSIS — I69993 Ataxia following unspecified cerebrovascular disease: Secondary | ICD-10-CM | POA: Diagnosis not present

## 2014-01-26 DIAGNOSIS — Z9181 History of falling: Secondary | ICD-10-CM | POA: Diagnosis not present

## 2014-01-28 DIAGNOSIS — E785 Hyperlipidemia, unspecified: Secondary | ICD-10-CM | POA: Diagnosis not present

## 2014-01-28 DIAGNOSIS — I10 Essential (primary) hypertension: Secondary | ICD-10-CM | POA: Diagnosis not present

## 2014-01-29 ENCOUNTER — Other Ambulatory Visit: Payer: Self-pay | Admitting: Sports Medicine

## 2014-01-29 DIAGNOSIS — Z9181 History of falling: Secondary | ICD-10-CM | POA: Diagnosis not present

## 2014-01-29 DIAGNOSIS — R269 Unspecified abnormalities of gait and mobility: Secondary | ICD-10-CM | POA: Diagnosis not present

## 2014-01-29 DIAGNOSIS — I69993 Ataxia following unspecified cerebrovascular disease: Secondary | ICD-10-CM | POA: Diagnosis not present

## 2014-01-29 LAB — LIPID PANEL
CHOL/HDL RATIO: 3.5
CHOLESTEROL: 107 mg/dL (ref 0–200)
HDL: 31 mg/dL — AB (ref 35–70)
LDL Calculated: 57 mg/dL
Triglycerides: 95 mg/dL (ref 40–160)
VLDL CHOLESTEROL CAL: 19

## 2014-01-29 LAB — HEPATIC FUNCTION PANEL
ALBUMIN: 3.3 g/dL — AB (ref 3.5–5.2)
ALT: 9 U/L (ref 0–35)
AST: 16 U/L (ref 0–37)
Alkaline Phosphatase: 88 U/L (ref 39–117)
BILIDIRFLUID: 0.1
Bilirubin, Total: 0.4 mg/dL
Indirect Bilirubin: 0.3
Total Protein: 6.3 g/dL (ref 6.0–8.3)

## 2014-02-01 DIAGNOSIS — R269 Unspecified abnormalities of gait and mobility: Secondary | ICD-10-CM | POA: Diagnosis not present

## 2014-02-01 DIAGNOSIS — Z9181 History of falling: Secondary | ICD-10-CM | POA: Diagnosis not present

## 2014-02-01 DIAGNOSIS — I69993 Ataxia following unspecified cerebrovascular disease: Secondary | ICD-10-CM | POA: Diagnosis not present

## 2014-02-02 DIAGNOSIS — I69993 Ataxia following unspecified cerebrovascular disease: Secondary | ICD-10-CM | POA: Diagnosis not present

## 2014-02-02 DIAGNOSIS — Z9181 History of falling: Secondary | ICD-10-CM | POA: Diagnosis not present

## 2014-02-02 DIAGNOSIS — R269 Unspecified abnormalities of gait and mobility: Secondary | ICD-10-CM | POA: Diagnosis not present

## 2014-02-03 DIAGNOSIS — R269 Unspecified abnormalities of gait and mobility: Secondary | ICD-10-CM | POA: Diagnosis not present

## 2014-02-03 DIAGNOSIS — Z9181 History of falling: Secondary | ICD-10-CM | POA: Diagnosis not present

## 2014-02-03 DIAGNOSIS — I69993 Ataxia following unspecified cerebrovascular disease: Secondary | ICD-10-CM | POA: Diagnosis not present

## 2014-02-05 DIAGNOSIS — I69993 Ataxia following unspecified cerebrovascular disease: Secondary | ICD-10-CM | POA: Diagnosis not present

## 2014-02-05 DIAGNOSIS — Z9181 History of falling: Secondary | ICD-10-CM | POA: Diagnosis not present

## 2014-02-05 DIAGNOSIS — R269 Unspecified abnormalities of gait and mobility: Secondary | ICD-10-CM | POA: Diagnosis not present

## 2014-02-06 DIAGNOSIS — I69993 Ataxia following unspecified cerebrovascular disease: Secondary | ICD-10-CM | POA: Diagnosis not present

## 2014-02-06 DIAGNOSIS — R269 Unspecified abnormalities of gait and mobility: Secondary | ICD-10-CM | POA: Diagnosis not present

## 2014-02-06 DIAGNOSIS — Z9181 History of falling: Secondary | ICD-10-CM | POA: Diagnosis not present

## 2014-02-07 DIAGNOSIS — Z9181 History of falling: Secondary | ICD-10-CM | POA: Diagnosis not present

## 2014-02-07 DIAGNOSIS — R269 Unspecified abnormalities of gait and mobility: Secondary | ICD-10-CM | POA: Diagnosis not present

## 2014-02-07 DIAGNOSIS — I69993 Ataxia following unspecified cerebrovascular disease: Secondary | ICD-10-CM | POA: Diagnosis not present

## 2014-02-08 ENCOUNTER — Encounter: Payer: Self-pay | Admitting: Pharmacist

## 2014-02-09 ENCOUNTER — Ambulatory Visit: Payer: Medicare Other | Admitting: Emergency Medicine

## 2014-02-19 ENCOUNTER — Non-Acute Institutional Stay (INDEPENDENT_AMBULATORY_CARE_PROVIDER_SITE_OTHER): Payer: Medicare Other | Admitting: Sports Medicine

## 2014-02-19 DIAGNOSIS — K589 Irritable bowel syndrome without diarrhea: Secondary | ICD-10-CM

## 2014-02-19 DIAGNOSIS — E559 Vitamin D deficiency, unspecified: Secondary | ICD-10-CM

## 2014-02-19 DIAGNOSIS — F0390 Unspecified dementia without behavioral disturbance: Secondary | ICD-10-CM

## 2014-02-19 DIAGNOSIS — E039 Hypothyroidism, unspecified: Secondary | ICD-10-CM

## 2014-02-19 DIAGNOSIS — I119 Hypertensive heart disease without heart failure: Secondary | ICD-10-CM

## 2014-02-19 NOTE — Progress Notes (Signed)
[facility] Patient Monthly Resident Progress Note Savannah Todd 80 y.o. female  MRN: [id number]  DOB: 05-20-1920   PCP: Gerda Diss, DO  Code Status: DNR  SUBJECTIVE:   She reports overall doing okay  Pt's son Savannah Todd present and voices concerns regarding non-essential medications  For further subjective including (HPI, Interval History & ROS) please see problem based charting  HISTORY: Savannah Todd's major active medical problems include: # Dementia: MMSE of 22, frequent falls due to poor safety awareness.  Repeat MMSE 02/20/14 = 17 # CVD: (Aortic Stenosis, HTN, HLD) - no known CAD, no prior MI or CVA No prior ECHOs  Other Pertinent Med/Surg/Hosp History: # Irritable Bowel Syndrome   Follow up Issues:     Recent Labs  11/27/13 01/17/14 01/28/14 1137  TRIG  --   --  95  CHOL  --   --  107  HDL  --   --  31*  LDLCALC  --   --  57  TSH 0.32* 2.20  --    Wt Readings from Last 3 Encounters:  01/24/14 127 lb 6.4 oz (57.788 kg)  02/23/14 127 lb 6.4 oz (57.788 kg)  11/24/13 126 lb (57.153 kg)   BP Readings from Last 3 Encounters:  02/20/14 129/65  02/23/14 149/87  12/22/13 151/61    History  Smoking status  . Never Smoker   Smokeless tobacco  . Not on file   Health Maintenance Due  Topic  . Tetanus/tdap   . Colonoscopy   . Zostavax   . Pneumococcal Polysaccharide Vaccine Age 17 And Over     Otherwise past Medical, Surgical, Social, and Family History Reviewed per EMR Medications and Allergies reviewed and updated per below.  Vitals: BP 149/87  Pulse 60  Temp(Src) 98.1 F (36.7 C)  Resp 18  Wt 127 lb 6.4 oz (57.788 kg)  SpO2 98%  PE: GENERAL:  Elderly caucasian  female. In no discomfort; no respiratory distress. PSYCH: Alert and appropriately interactive; Insight:Fair        Mental Status: complete MMSE performed = 17/30 H&N: AT/[place], trachea midline EENT:  MMM, no scleral icterus, EOMi HEART: Regular, systolic blowing murmur (3/6) LUNGS: CTA B, no  wheezes, no crackles EXTREMITIES: Moves all 4 extremities spontaneously, warm well perfused, 1+/4 edema (compression socks in place)bilateral DP and PT pulses 1+/4.    MEDICATIONS, LABS & OTHER ORDERS: Previous Medications   ACETAMINOPHEN (TYLENOL) 650 MG CR TABLET    Take 650 mg by mouth every 8 (eight) hours.   BALSALAZIDE (COLAZAL) 750 MG CAPSULE    Take 2 capsules (1,500 mg total) by mouth 3 (three) times daily. 3 tablets TID   BISACODYL (DULCOLAX) 10 MG SUPPOSITORY    Place 10 mg rectally daily as needed for constipation (if constipation not relieved by milk of magnesia).   FEEDING SUPPLEMENT, RESOURCE BREEZE, (RESOURCE BREEZE) LIQD    Take 1 Container by mouth 3 (three) times daily between meals.   LEVOTHYROXINE (SYNTHROID, LEVOTHROID) 125 MCG TABLET    Take 1 tablet (125 mcg total) by mouth daily before breakfast.   MULTIPLE VITAMIN (MULTIVITAMIN PO)    Take 1 tablet by mouth daily.     SIMVASTATIN (ZOCOR) 10 MG TABLET    Take 10 mg by mouth at bedtime.   Modified Medications   No medications on file   New Prescriptions   CALCIUM CITRATE (CALCITRATE - DOSED IN MG ELEMENTAL CALCIUM) 950 MG TABLET    Take 1 tablet (200 mg of  elemental calcium total) by mouth daily.   CHOLECALCIFEROL (VITAMIN D3) 50000 UNITS CAPS    Take 50,000 Units by mouth once a week.   Discontinued Medications   CALCIUM CARBONATE ANTACID (TUMS PO)    Take 1 tablet by mouth as needed (indigestion).    CALCIUM-VITAMIN D-VITAMIN K (VIACTIV PO)    Take 1 tablet by mouth daily.    CHOLECALCIFEROL (VITAMIN D) 400 UNITS TABS TABLET    Take 400 Units by mouth daily.  No orders of the defined types were placed in this encounter.    ASSESSMENT & PLAN:  See problem based charting & AVS for pt instructions.

## 2014-02-20 NOTE — Assessment & Plan Note (Addendum)
Problem Based Documentation:    Subjective Report:  Pt seems to be having a harder time with her memory.  Son's interested in possibly making changes to meds     Assessment & Plan & Follow up Issues:  Chronic, degenerative condition MMSE of 17/30.  I had a long discussion with the patient and the patient's 2 sons Savannah Todd and Savannah Todd who are going to discuss with her third brother whether or not to start Aricept. 1. No current change in medications but see below > Follow up with family regarding starting Aricept > Consider discontinuing Zocor for potential negative cognitive effects; risks of stopping understood by family and would prefer sequential change in therapy with addition of Aricept and discontinuation of Zocor in one month

## 2014-02-20 NOTE — Assessment & Plan Note (Signed)
Problem Based Documentation:    Subjective Report:  Denies tachypalpitations or changes in hair/nails  Last TSH therapeutic @ 2.20     Assessment & Plan & Follow up Issues:  Chronic, well controlled condition 1. No changes > F/u TSH in 6 months

## 2014-02-22 ENCOUNTER — Non-Acute Institutional Stay: Payer: Medicare Other | Admitting: Family Medicine

## 2014-02-22 ENCOUNTER — Encounter: Payer: Self-pay | Admitting: Family Medicine

## 2014-02-22 DIAGNOSIS — I350 Nonrheumatic aortic (valve) stenosis: Secondary | ICD-10-CM

## 2014-02-22 DIAGNOSIS — I359 Nonrheumatic aortic valve disorder, unspecified: Secondary | ICD-10-CM | POA: Diagnosis not present

## 2014-02-22 DIAGNOSIS — I119 Hypertensive heart disease without heart failure: Secondary | ICD-10-CM

## 2014-02-22 DIAGNOSIS — R609 Edema, unspecified: Secondary | ICD-10-CM | POA: Diagnosis not present

## 2014-02-22 DIAGNOSIS — F0390 Unspecified dementia without behavioral disturbance: Secondary | ICD-10-CM | POA: Diagnosis not present

## 2014-02-22 DIAGNOSIS — R600 Localized edema: Secondary | ICD-10-CM

## 2014-02-22 NOTE — Assessment & Plan Note (Signed)
A: persistent asymptomatic other than fatigue. No syncope/presyncope or SOB.  P: monitor

## 2014-02-22 NOTE — Assessment & Plan Note (Signed)
Stable off medications. Will continue to monitor

## 2014-02-22 NOTE — Assessment & Plan Note (Signed)
A: agree that since forgetfulness is troubling patient pharmacotherapy is reasonable. Agree with trial off Zocor and initiating Aricept. Patient is amenable. P: pcp to f/u with son's regarding their thoughts. Of course, we will start low and d/c medication if patient develops intolerable side effects, GI upset.  Of course there is the option of the rivastigmine patch if the patient cannot tolerate Aricept due to GI upset.

## 2014-02-22 NOTE — Assessment & Plan Note (Signed)
A: improved off amlodipine and with compression stockings in AM P: resolve problem.

## 2014-02-22 NOTE — Progress Notes (Signed)
  [facility] attending progress note  Subjective:    Patient ID: Savannah Todd, female    DOB: 03/20/1920, 80 y.o.   MRN: [id number]  HPI Patient seen for f/u visit:  Complaints/concerns: fatigue and trouble with memory. Reports resting well at night. Easily awakened. Feels well rested in AM. Feel tired after meals. Admits to hard time initiating bowel movements. Admits that stools are dark. Denies black stools, denies blood in stool. Denies fever, chills, changes in appetite, skin and hair changes.   Regarding memory, patient reports occasional confusion and trouble remembering things. She call recall she has 3 sons, 3 daughter-in-laws, some step daughters. One son is a Tax adviser. Cannot recall what other sons do. This causes some distress to patient, but in general her mood is good.   Acute events:/ updates: considering starting medication for dementia. Patient's PCP has initiated discussion with her son. Patient is amenable to medication if it is recommended.   Review of Systems As per HPI  No dizziness or lightheadedness with standing     Objective:   Physical Exam BP 129/65  Pulse 58  Temp(Src) 98.1 F (36.7 C)  Resp 18  Wt 127 lb 6.4 oz (57.788 kg)  SpO2 98% Wt Readings from Last 3 Encounters:  01/24/14 127 lb 6.4 oz (57.788 kg)  11/24/13 126 lb (57.153 kg)  10/17/13 144 lb 8 oz (65.545 kg)  General appearance: alert, cooperative and no distress, elderly female, sitting in wheelchair. Thoracic kyphosis.  Lungs: clear to auscultation bilaterally Heart: S1, S2 normal and systolic murmur: systolic ejection 4/6, blowing throughout the precordium Abdomen: soft, non-tender; bowel sounds normal; no masses,  no organomegaly Extremities: extremities normal, atraumatic, no cyanosis or edema Neurologic: able to rise to stand with assistance. Able to stand w/o assistance. With hips and knees slightly flexed.  5/5 strength in UE.  Alert to person, name and DOB. Alert to place, a [facility], not the hospital.  Alert to time, Alt[place] D[place], winter, not year 20-something. Able to name two items-pencil (actually pen), and watch      Assessment & Plan:

## 2014-02-23 DIAGNOSIS — E559 Vitamin D deficiency, unspecified: Secondary | ICD-10-CM | POA: Insufficient documentation

## 2014-02-23 MED ORDER — VITAMIN D3 1.25 MG (50000 UT) PO CAPS: 50000.0000 [IU] | ORAL_CAPSULE | ORAL | Status: DC

## 2014-02-23 MED ORDER — CALCIUM CITRATE 950 (200 CA) MG PO TABS: 200.0000 mg | ORAL_TABLET | Freq: Every day | ORAL | Status: DC

## 2014-02-23 NOTE — Assessment & Plan Note (Signed)
Blood pressure labile blood mainly at goal.  No changes to avoid hypotension.

## 2014-02-23 NOTE — Assessment & Plan Note (Signed)
Continue salicylate therapy (balsalazide) indefinitely

## 2014-02-23 NOTE — Assessment & Plan Note (Addendum)
Chronic supplementation with daily vitamin D and calcium supplements. Change in therapy to vitamin D3 50,000 units q. Weekly. Change calcium to citrate 200/950 mg once daily. Medication changes reflected in medication list.

## 2014-02-27 DIAGNOSIS — E785 Hyperlipidemia, unspecified: Secondary | ICD-10-CM | POA: Diagnosis not present

## 2014-03-04 IMAGING — CT CT HEAD W/O CM
2 of 5 series · 11 of 47 positions shown, 13 images · non-contrast
Comparison: 09/14/2013

CLINICAL DATA: Patient fell twice yesterday

EXAM:
CT HEAD WITHOUT CONTRAST
CT CERVICAL SPINE WITHOUT CONTRAST
TECHNIQUE: Multidetector CT imaging of the head and cervical spine was
performed following the standard protocol without intravenous
contrast. Multiplanar CT image reconstructions of the cervical spine
were also generated.

[Series 7: coronals · coronal · 0.28mm/px · 3 of 38 slices shown]
[im 13/38  brain]
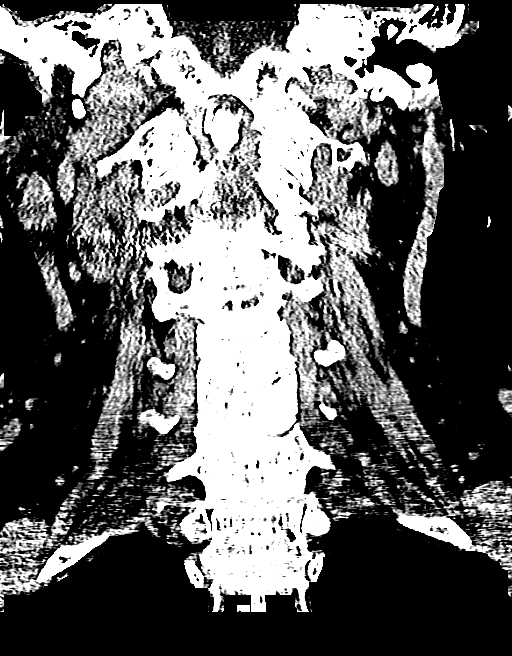
[im 17/38  brain]
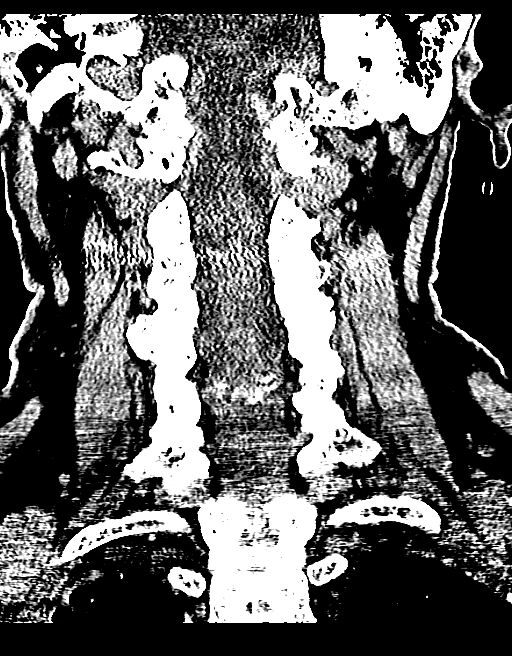
[im 21/38  brain]
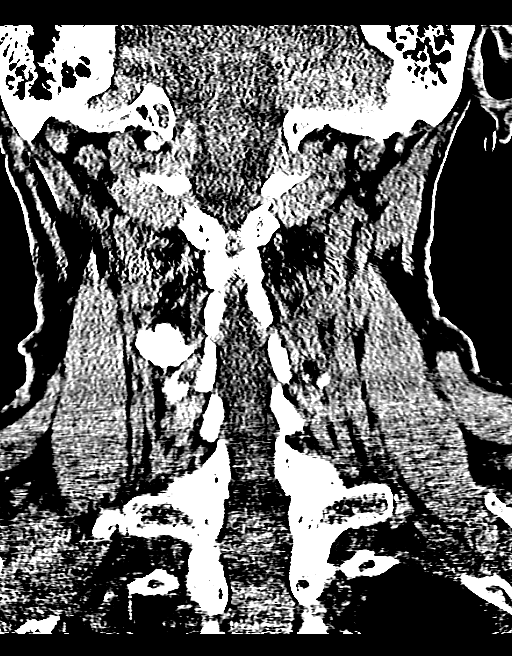

[Series 9: orthogonals · axial · 0.21mm/px · z∈[+333,+471]mm · 8 of 88 slices shown, 10 images]
[im 8/88  brain]
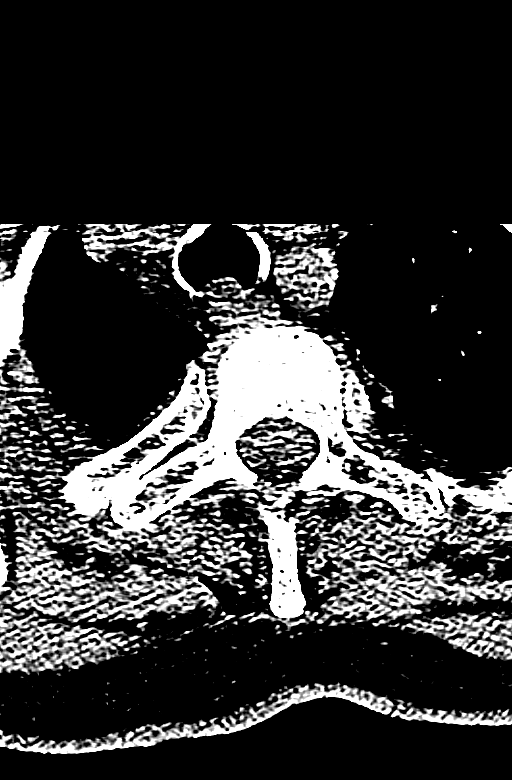
[im 8/88  bone]
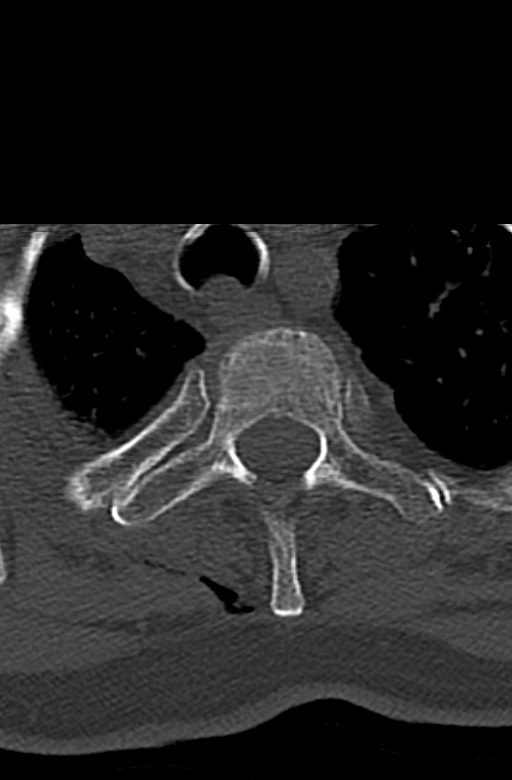
[im 22/88  brain]
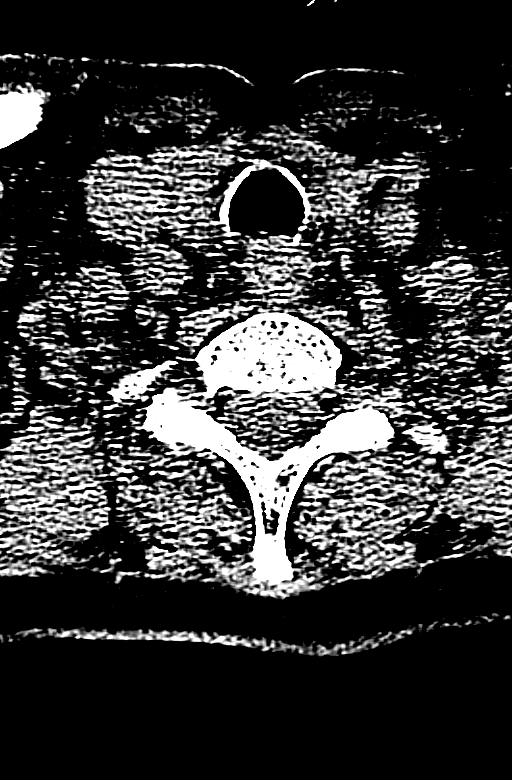
[im 30/88  brain]
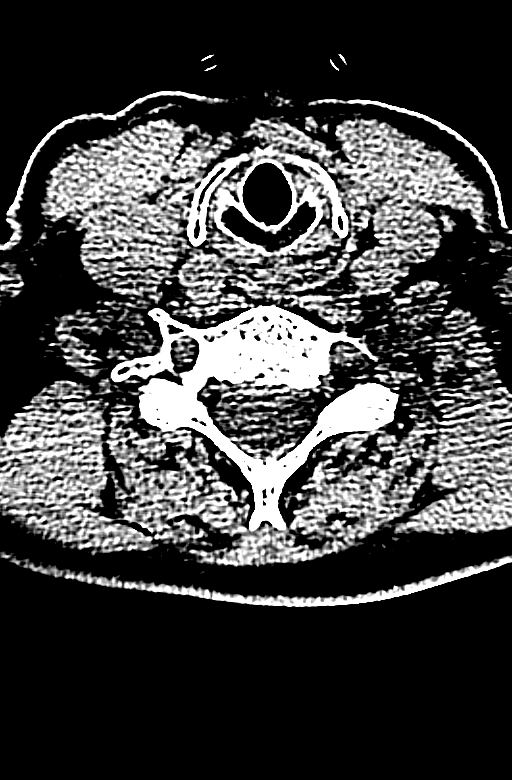
[im 37/88  brain]
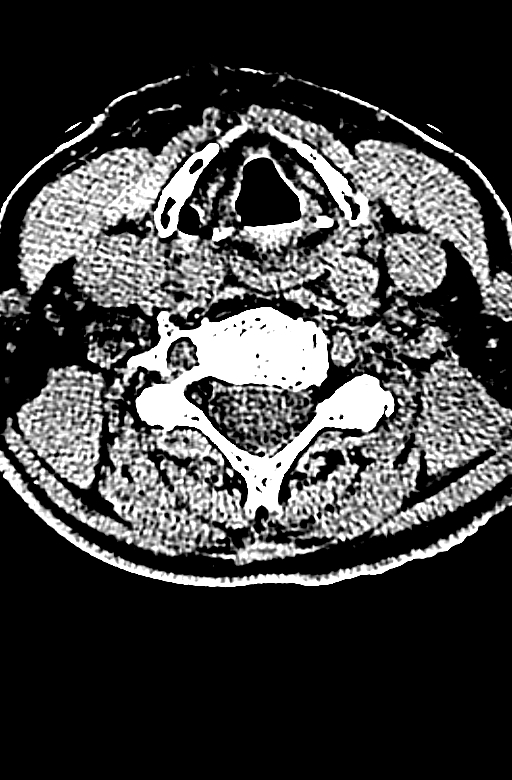
[im 51/88  brain]
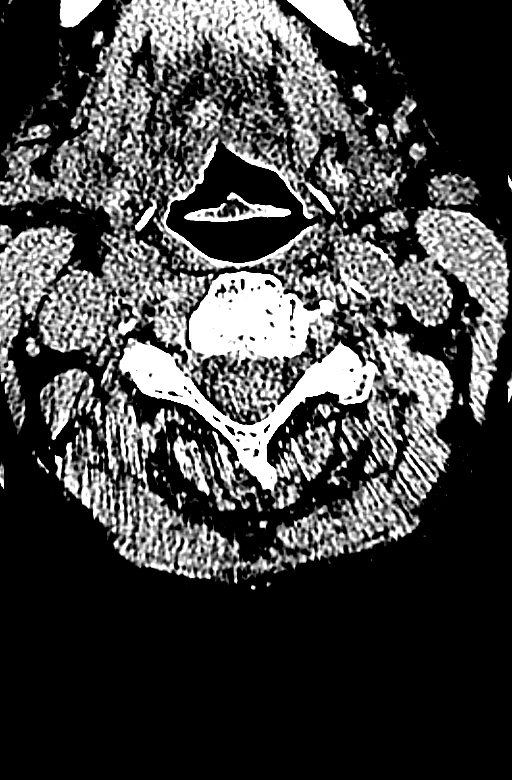
[im 51/88  bone]
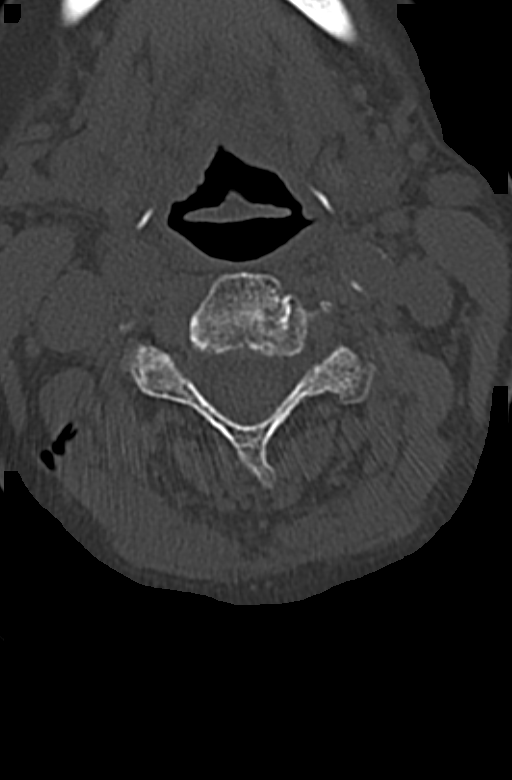
[im 59/88  brain]
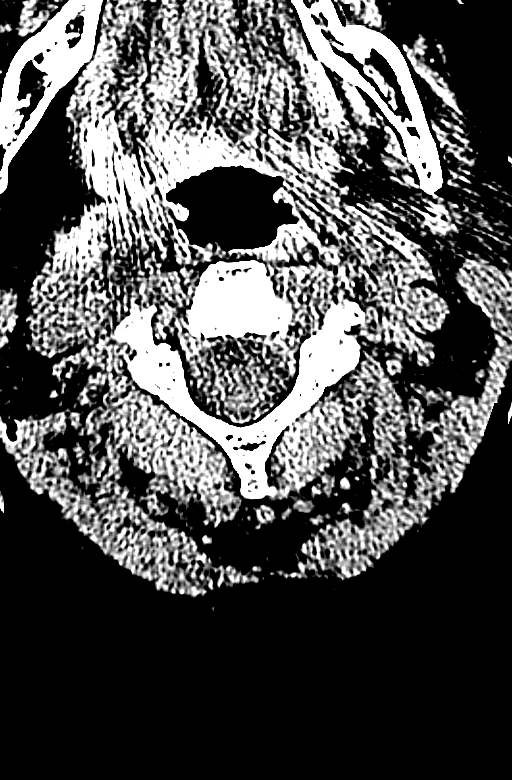
[im 66/88  brain]
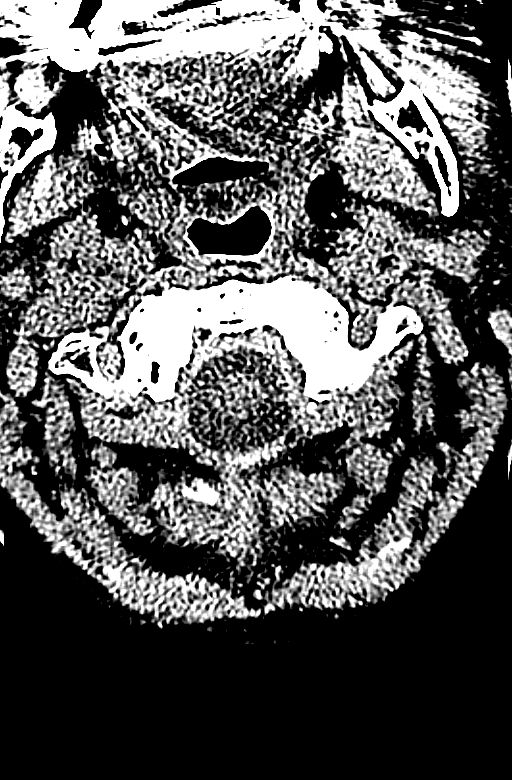
[im 80/88  brain]
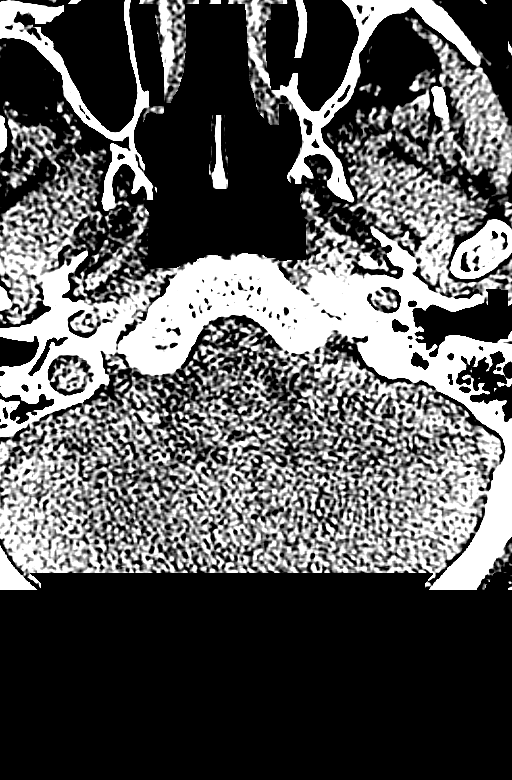

[11 of 47 positions shown; findings below may reference images not displayed]

FINDINGS: CT HEAD FINDINGS

Anticipated age-related atrophy and low attenuation in the white
matter. No hemorrhage, extra-axial fluid, infarct, mass, or skull
fracture.

CT CERVICAL SPINE FINDINGS

There is a small pneumothorax on the right. There is a small volume
of subcutaneous emphysema dorsally in the right paraspinous soft
tissues. There is no cervical spine fracture. There is normal
anterior-posterior alignment. There is no prevertebral soft tissue
swelling. There is multilevel degenerative disc disease.
IMPRESSION: No acute intracranial abnormality. No evidence of cervical spine
fracture. Right pneumothorax and right thoracic subcutaneous
emphysema.See report of CT thorax for full details.

## 2014-03-04 IMAGING — CT CT CHEST W/ CM
2 of 5 series · 13 of 36 positions shown, 16 images · IV contrast (APPLIED)
Comparison: None.

CLINICAL DATA: Multiple falls yesterday, bilateral rib pain,
crepitus on right side PHYSICAL EXAMINATION

EXAM:
CT CHEST, ABDOMEN, AND PELVIS WITH CONTRAST
TECHNIQUE: Multidetector CT imaging of the chest, abdomen and pelvis was
performed following the standard protocol during bolus
administration of intravenous contrast.
CONTRAST:  100mL OMNIPAQUE IOHEXOL 300 MG/ML  SOLN

[Series 2: cap 5.0 i31f 1 · axial · 0.82mm/px · z∈[-133,+342]mm · 10 of 111 slices shown, 13 images]
[im 8/111  mediastinal]
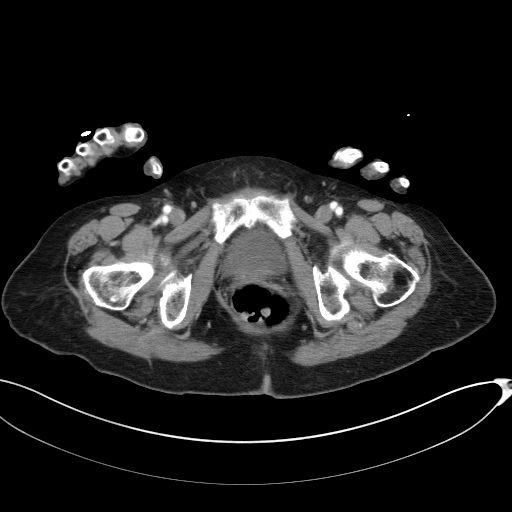
[im 8/111  lung]
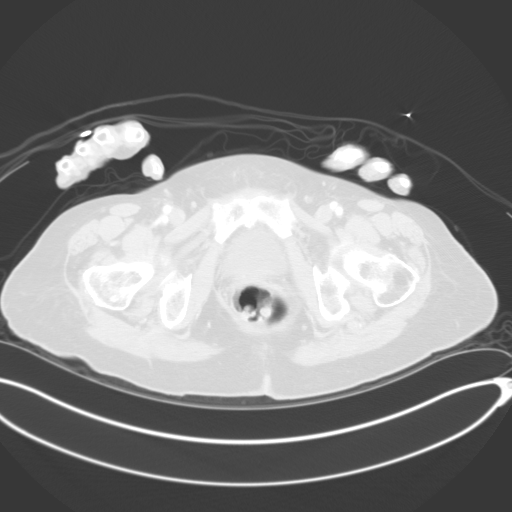
[im 23/111  lung]
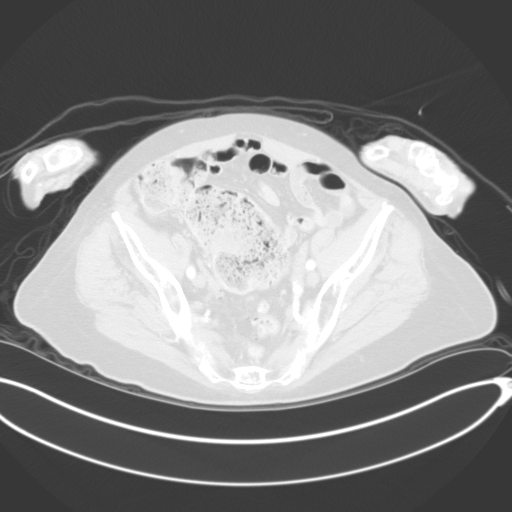
[im 30/111  lung]
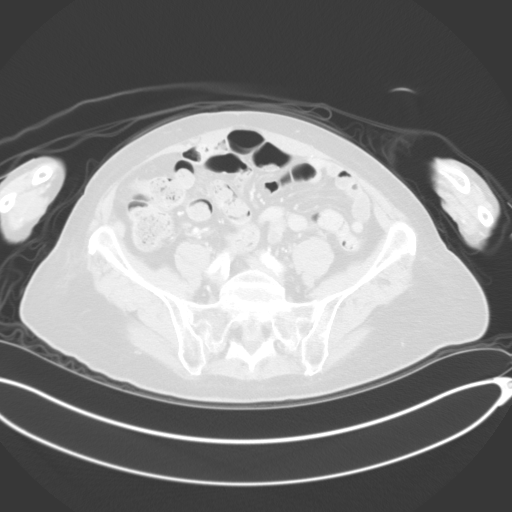
[im 37/111  lung]
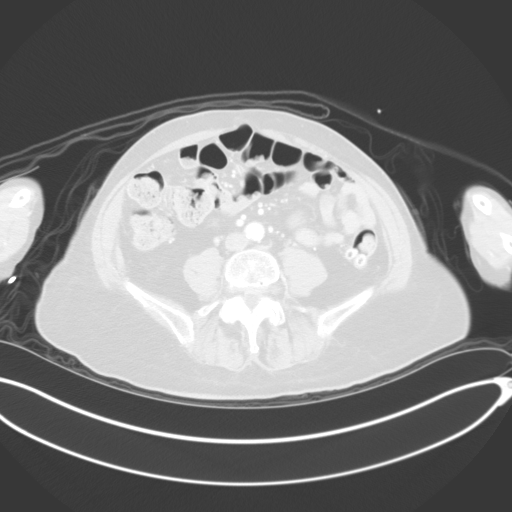
[im 52/111  mediastinal]
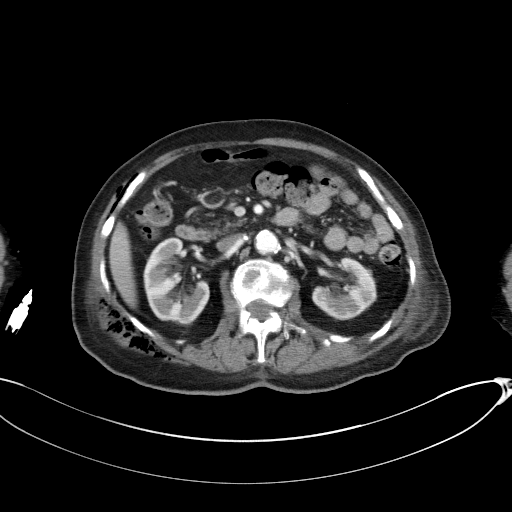
[im 52/111  lung]
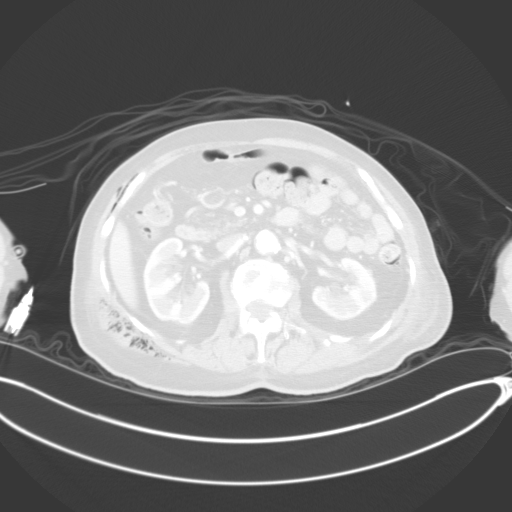
[im 59/111  lung]
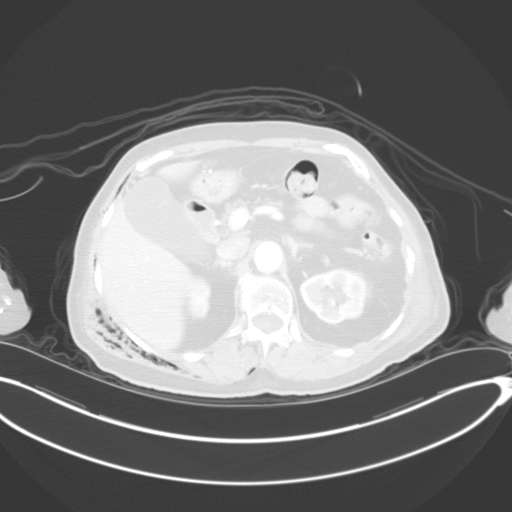
[im 74/111  lung]
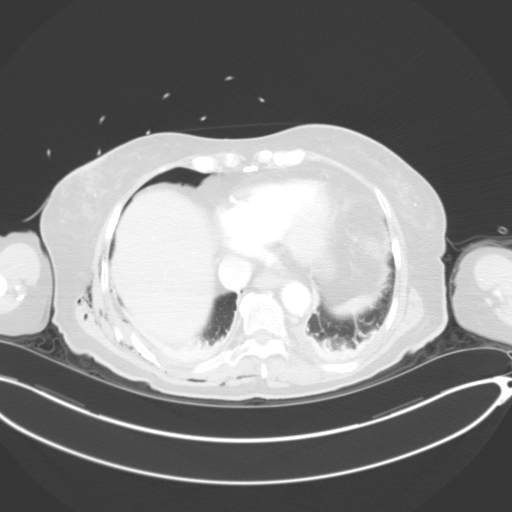
[im 81/111  lung]
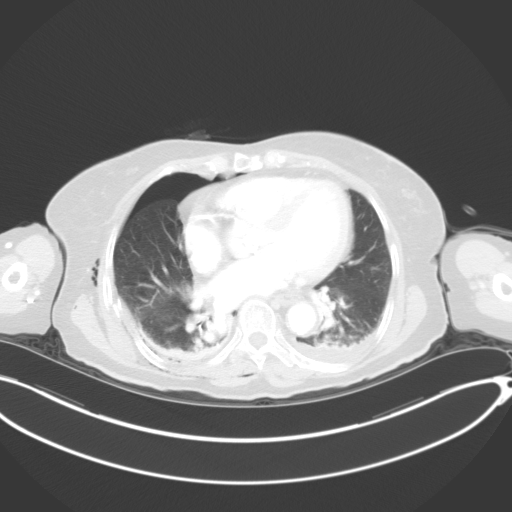
[im 89/111  mediastinal]
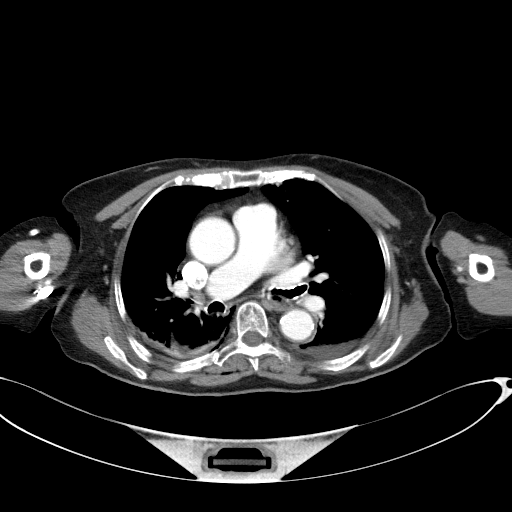
[im 89/111  lung]
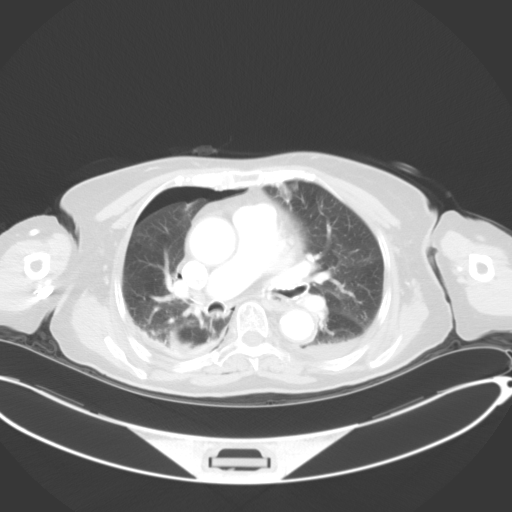
[im 103/111  lung]
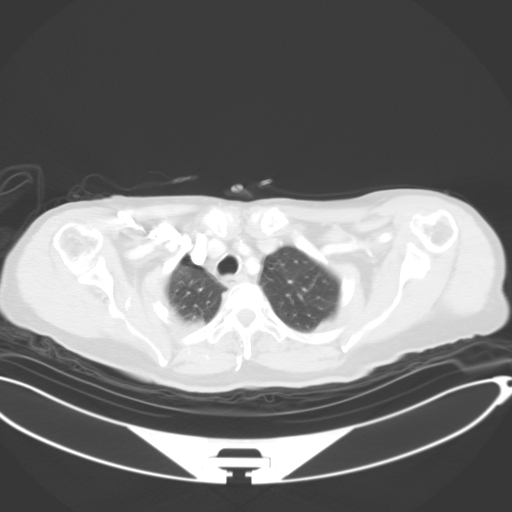

[Series 5: coronal · coronal · 0.67mm/px · 3 of 72 slices shown]
[im 15/72  lung]
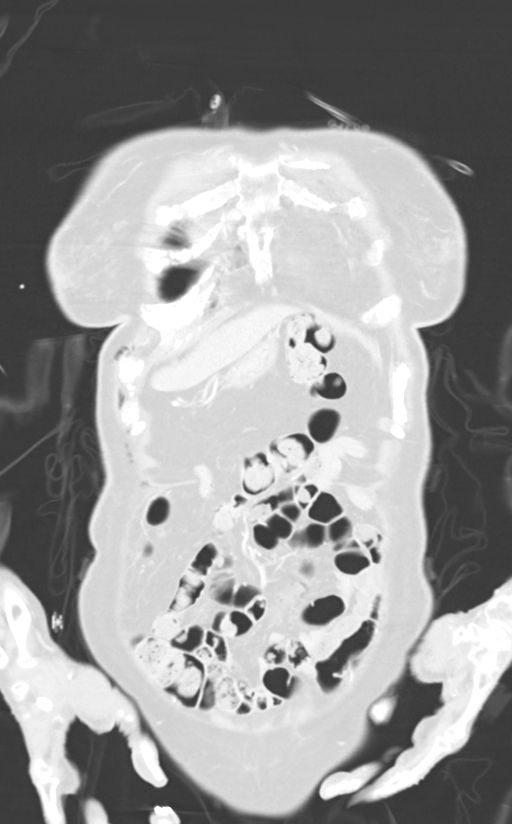
[im 29/72  lung]
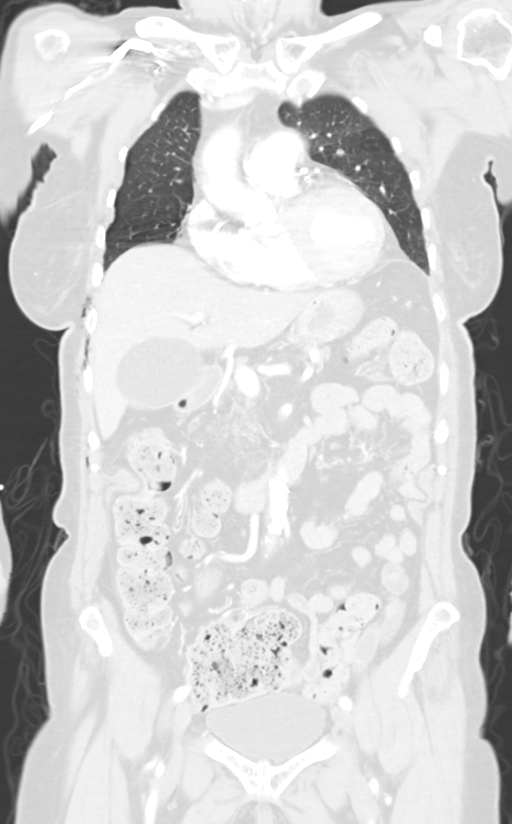
[im 43/72  lung]
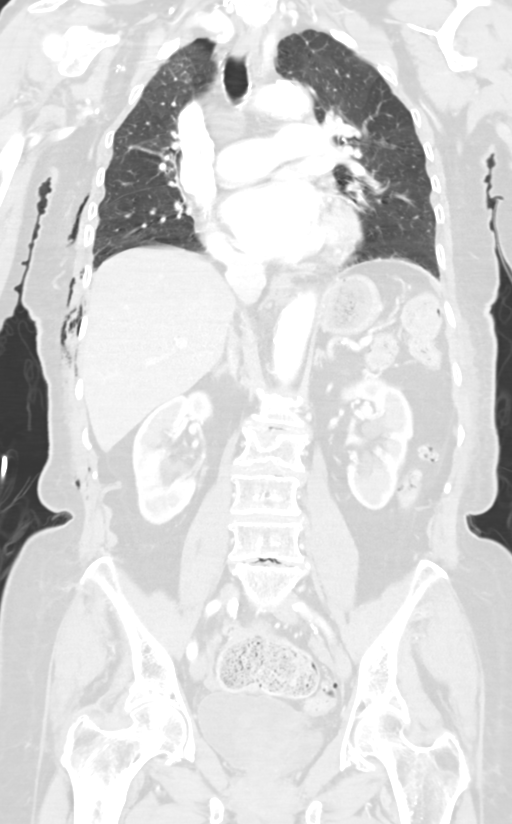

[13 of 36 positions shown; findings below may reference images not displayed]

FINDINGS: CT CHEST FINDINGS

Coronary and aortic calcification noted. No acute abnormalities
involving heart or great vessels. Small bilateral pleural effusions.
Bilateral mild dependent atelectasis. There is a small to moderate
right pneumothorax anteriorly measuring up to 18 mm. No left
pneumothorax is identified. On the left, there is a mildly displaced
fracture of the posterior 12th rib. There is a moderately displaced
fracture of the posterior 11th rib. On the right, there is a mildly
displaced fracture of the posterior right 9th rib. There is a
severely displaced fracture of the posterior lateral 8th rib. There
is a severely displaced fracture of the lateral 7th rib. There is a
moderately displaced fracture of the lateral 6th rib. In particular,
the proximal fragment of the right 7th rib projects about 1 cm in 2
right lung parenchyma with significant surrounding hematoma,
hemothorax, and subcutaneous emphysema.

CT ABDOMEN AND PELVIS FINDINGS

Liver and gallbladder are normal. Spleen shows no acute findings.
There is an incidental subcentimeter splenic calcification. Pancreas
is normal. Adrenal glands are normal. There are a few small renal
cysts. There is calcification of the aorta. Bowel is normal. No free
air or fluid in the abdomen or pelvis. There is diverticulosis of
the sigmoid colon. Bladder is normal. Reproductive organs appear
absent. Inferior aspect of the pubic rami are excluded from the
field of view. There is a significant compression deformity of the
L2 superior endplate. L1 vertebral body shows mild to moderate
compression deformity. No retropulsion identified. Fracture line is
visible along the superior aspect of L1 vertebral body.
IMPRESSION: 1. Bilateral displaced rib fractures right more numerous and more
severely displaced than left.
2. Right pneumothorax. Subcutaneous emphysema. Small bilateral hemo
thoraces.
3. L2 superior endplate fracture age indeterminate. L1 compression
deformity, age uncertain. Fracture line is visible suggesting acute
to subacute fracture.
Critical Value/emergent results were called by telephone at the time
of interpretation on 10/15/2013 at [DATE] to Dr.CZARINA STCLAIR ,
who verbally acknowledged these results.

## 2014-03-05 ENCOUNTER — Telehealth: Payer: Self-pay

## 2014-03-05 NOTE — Telephone Encounter (Signed)
Son called and stated that they are moving pt into private care facility and will need a hospital bed ordered. Dr Everlene Farrier had asked pt to contact me to help order it. Dr Everlene Farrier, I called [facility] and they stated that they need demographics, ins, OV notes documenting need for hospital bed (Dxs that would require), and order. I have printed off all info including last OV notes, but they were more than 6 mos ago which I think Medicare requires. [facility] advised to send over what we have and the intakes rep will work w/us to let us know what more is needed to get it covered. Please open order I began for bed and answer questions required concerning Dxs and type.

## 2014-03-06 ENCOUNTER — Other Ambulatory Visit: Payer: Self-pay | Admitting: Radiology

## 2014-03-06 NOTE — Telephone Encounter (Addendum)
We can get the patient a hospital bed, but not without visit. I will have the order ready when they come in on March 27th.

## 2014-03-06 NOTE — Telephone Encounter (Signed)
Please Dr. Ninfa Meeker and see if we can get her an appointment to see me the morning of March 27. That way and document have seen her her review her medications and fill out the appropriate forms. Please call her son Dr. Judee Clara and let him know what is going on.

## 2014-03-06 NOTE — Telephone Encounter (Signed)
Spoke to pt's son, he is aware of message,  did not see where an appointment had been scheduled for 03/16/14.  Transferred to 104 to schedule for the 27th of march.

## 2014-03-07 NOTE — Telephone Encounter (Signed)
Amy, do you have the order for bed elsewhere, or do you want to keep this encounter open to put order in? If not needed, please remove Rx and close encounter. Thanks

## 2014-03-16 ENCOUNTER — Ambulatory Visit (INDEPENDENT_AMBULATORY_CARE_PROVIDER_SITE_OTHER): Payer: Medicare Other | Admitting: Emergency Medicine

## 2014-03-16 ENCOUNTER — Encounter: Payer: Self-pay | Admitting: Emergency Medicine

## 2014-03-16 VITALS — BP 150/70 | HR 59 | Temp 98.2°F | Resp 16 | Wt 123.6 lb

## 2014-03-16 DIAGNOSIS — F0390 Unspecified dementia without behavioral disturbance: Secondary | ICD-10-CM

## 2014-03-16 DIAGNOSIS — R269 Unspecified abnormalities of gait and mobility: Secondary | ICD-10-CM

## 2014-03-16 DIAGNOSIS — E559 Vitamin D deficiency, unspecified: Secondary | ICD-10-CM

## 2014-03-16 DIAGNOSIS — I10 Essential (primary) hypertension: Secondary | ICD-10-CM

## 2014-03-16 DIAGNOSIS — Z9181 History of falling: Secondary | ICD-10-CM | POA: Diagnosis not present

## 2014-03-16 DIAGNOSIS — E039 Hypothyroidism, unspecified: Secondary | ICD-10-CM | POA: Diagnosis not present

## 2014-03-16 DIAGNOSIS — L989 Disorder of the skin and subcutaneous tissue, unspecified: Secondary | ICD-10-CM | POA: Diagnosis not present

## 2014-03-16 LAB — COMPLETE METABOLIC PANEL WITH GFR
ALT: 9 U/L (ref 0–35)
AST: 17 U/L (ref 0–37)
Albumin: 3.6 g/dL (ref 3.5–5.2)
Alkaline Phosphatase: 96 U/L (ref 39–117)
BUN: 16 mg/dL (ref 6–23)
CALCIUM: 9.1 mg/dL (ref 8.4–10.5)
CO2: 26 meq/L (ref 19–32)
Chloride: 104 mEq/L (ref 96–112)
Creat: 0.7 mg/dL (ref 0.50–1.10)
GFR, EST AFRICAN AMERICAN: 88 mL/min
GFR, Est Non African American: 76 mL/min
Glucose, Bld: 111 mg/dL — ABNORMAL HIGH (ref 70–99)
POTASSIUM: 4.3 meq/L (ref 3.5–5.3)
SODIUM: 139 meq/L (ref 135–145)
TOTAL PROTEIN: 7 g/dL (ref 6.0–8.3)
Total Bilirubin: 0.3 mg/dL (ref 0.2–1.2)

## 2014-03-16 LAB — CBC
HCT: 37.1 % (ref 36.0–46.0)
Hemoglobin: 12.5 g/dL (ref 12.0–15.0)
MCH: 28.1 pg (ref 26.0–34.0)
MCHC: 33.7 g/dL (ref 30.0–36.0)
MCV: 83.4 fL (ref 78.0–100.0)
Platelets: 304 10*3/uL (ref 150–400)
RBC: 4.45 MIL/uL (ref 3.87–5.11)
RDW: 14.4 % (ref 11.5–15.5)
WBC: 7.5 10*3/uL (ref 4.0–10.5)

## 2014-03-16 NOTE — Progress Notes (Signed)
   Subjective:    Patient ID: Savannah Todd, female    DOB: 08/28/1920, 80 y.o.   MRN: [id number]  HPI Patient has been residing in long term care facility, [facility], now she has been home since yesterday. She has hospital bed has purchased this. Would like home health physical therapy will also need nursing evaluation for safety evaluation. Patient has had recent falls and is a high fall risk. Her sons indicate she does have advanced directives but do not have details of this, they were left at [facility]. DNR updated and signed today.  Patient does have lesion of face, and will refer to dermatology for this.  Patient has discontinued her blood pressure medications, stable at this time and discussed with family it is okay for her to continue not to take this.    Review of Systems     Objective:   Physical Exam Patient cooperative during exam. Legs not swollen today. Appears healthy, sons indicate she is eating well.  There is a raised firm area on the right side of the nasal bridge. The neck is supple. Chest is clear. Cardiac reveals slight irregularity with 1/6 systolic murmur. . Abdomen is soft nontender. Extremities are without edema.      Assessment & Plan:  Dr Denna Haggard should evaluate area on patients face, may need cryo treatment DO NOT RESUSCITATE was signed today. Has spent time discussing that with the family Referrals made for home health physical therapy and for home health nursing evaluations.

## 2014-03-17 LAB — TSH: TSH: 1.37 u[IU]/mL (ref 0.350–4.500)

## 2014-03-17 LAB — VITAMIN D 25 HYDROXY (VIT D DEFICIENCY, FRACTURES): Vit D, 25-Hydroxy: 46 ng/mL (ref 30–89)

## 2014-03-19 ENCOUNTER — Telehealth: Payer: Self-pay | Admitting: *Deleted

## 2014-03-19 NOTE — Telephone Encounter (Signed)
[facility] will be starting home care today or Tomorrow- they did not receive the referral until after 5pm

## 2014-03-20 DIAGNOSIS — M6281 Muscle weakness (generalized): Secondary | ICD-10-CM | POA: Diagnosis not present

## 2014-03-20 DIAGNOSIS — I119 Hypertensive heart disease without heart failure: Secondary | ICD-10-CM | POA: Diagnosis not present

## 2014-03-20 DIAGNOSIS — F0390 Unspecified dementia without behavioral disturbance: Secondary | ICD-10-CM | POA: Diagnosis not present

## 2014-03-20 DIAGNOSIS — Z9181 History of falling: Secondary | ICD-10-CM | POA: Diagnosis not present

## 2014-03-21 DIAGNOSIS — F0390 Unspecified dementia without behavioral disturbance: Secondary | ICD-10-CM | POA: Diagnosis not present

## 2014-03-21 DIAGNOSIS — Z9181 History of falling: Secondary | ICD-10-CM | POA: Diagnosis not present

## 2014-03-21 DIAGNOSIS — I119 Hypertensive heart disease without heart failure: Secondary | ICD-10-CM | POA: Diagnosis not present

## 2014-03-21 DIAGNOSIS — M6281 Muscle weakness (generalized): Secondary | ICD-10-CM | POA: Diagnosis not present

## 2014-03-27 DIAGNOSIS — Z9181 History of falling: Secondary | ICD-10-CM | POA: Diagnosis not present

## 2014-03-27 DIAGNOSIS — I119 Hypertensive heart disease without heart failure: Secondary | ICD-10-CM | POA: Diagnosis not present

## 2014-03-27 DIAGNOSIS — F0390 Unspecified dementia without behavioral disturbance: Secondary | ICD-10-CM | POA: Diagnosis not present

## 2014-03-27 DIAGNOSIS — M6281 Muscle weakness (generalized): Secondary | ICD-10-CM | POA: Diagnosis not present

## 2014-03-28 ENCOUNTER — Telehealth: Payer: Self-pay

## 2014-03-28 DIAGNOSIS — F0390 Unspecified dementia without behavioral disturbance: Secondary | ICD-10-CM | POA: Diagnosis not present

## 2014-03-28 DIAGNOSIS — I119 Hypertensive heart disease without heart failure: Secondary | ICD-10-CM | POA: Diagnosis not present

## 2014-03-28 DIAGNOSIS — M6281 Muscle weakness (generalized): Secondary | ICD-10-CM | POA: Diagnosis not present

## 2014-03-28 DIAGNOSIS — Z9181 History of falling: Secondary | ICD-10-CM | POA: Diagnosis not present

## 2014-03-28 MED ORDER — AIRGO ROLLING WALKER MISC: Status: AC

## 2014-03-28 NOTE — Telephone Encounter (Signed)
Faxed in Rx and notified PT was sent.

## 2014-03-28 NOTE — Telephone Encounter (Signed)
[facility] called and LM asking for order for rolling walker to be faxed to [phone]. We can call PT at [phone] if needed. Pended order.

## 2014-03-28 NOTE — Telephone Encounter (Signed)
Please go ahead and plan out the order or a rolling walker and I will sign it.

## 2014-03-29 ENCOUNTER — Telehealth: Payer: Self-pay

## 2014-03-29 DIAGNOSIS — F0390 Unspecified dementia without behavioral disturbance: Secondary | ICD-10-CM | POA: Diagnosis not present

## 2014-03-29 DIAGNOSIS — M6281 Muscle weakness (generalized): Secondary | ICD-10-CM | POA: Diagnosis not present

## 2014-03-29 DIAGNOSIS — I119 Hypertensive heart disease without heart failure: Secondary | ICD-10-CM | POA: Diagnosis not present

## 2014-03-29 DIAGNOSIS — Z9181 History of falling: Secondary | ICD-10-CM | POA: Diagnosis not present

## 2014-03-29 DIAGNOSIS — E559 Vitamin D deficiency, unspecified: Secondary | ICD-10-CM

## 2014-03-29 MED ORDER — VITAMIN D3 1.25 MG (50000 UT) PO CAPS: 50000.0000 [IU] | ORAL_CAPSULE | ORAL | Status: DC

## 2014-03-29 MED ORDER — CALCIUM CITRATE 950 (200 CA) MG PO TABS: 200.0000 mg | ORAL_TABLET | Freq: Every day | ORAL | Status: DC

## 2014-03-29 MED ORDER — LEVOTHYROXINE SODIUM 125 MCG PO TABS: 125.0000 ug | ORAL_TABLET | Freq: Every day | ORAL | Status: DC

## 2014-03-29 NOTE — Telephone Encounter (Signed)
Pts son requesting refills on pts medications Vitamin D3, Levothyroxin 161mcg, Calcium  Dr Everlene Farrier is pts primary.   [facility] [address]  Can contact either of her sons for questions.

## 2014-03-30 ENCOUNTER — Other Ambulatory Visit: Payer: Self-pay | Admitting: *Deleted

## 2014-03-30 ENCOUNTER — Other Ambulatory Visit: Payer: Self-pay | Admitting: Radiology

## 2014-03-30 DIAGNOSIS — E559 Vitamin D deficiency, unspecified: Secondary | ICD-10-CM

## 2014-03-30 MED ORDER — LEVOTHYROXINE SODIUM 125 MCG PO TABS: 125.0000 ug | ORAL_TABLET | Freq: Every day | ORAL | Status: DC

## 2014-03-30 MED ORDER — CALCIUM CITRATE 950 (200 CA) MG PO TABS: 200.0000 mg | ORAL_TABLET | Freq: Every day | ORAL | Status: DC

## 2014-03-30 MED ORDER — VITAMIN D3 1.25 MG (50000 UT) PO CAPS
50000.0000 [IU] | ORAL_CAPSULE | ORAL | Status: DC
Start: 2014-03-30 — End: 2014-10-14

## 2014-03-30 MED ORDER — VITAMIN D3 1.25 MG (50000 UT) PO CAPS
50000.0000 [IU] | ORAL_CAPSULE | ORAL | Status: DC
Start: 2014-03-30 — End: 2014-03-30

## 2014-03-30 NOTE — Telephone Encounter (Signed)
Son came in indicated meds not sent to [facility], have resent, but they were sent to [facility]. He indicates he called in and was told they were sent to [facility], but they were not,  I see no record of this call.

## 2014-04-02 DIAGNOSIS — F0390 Unspecified dementia without behavioral disturbance: Secondary | ICD-10-CM | POA: Diagnosis not present

## 2014-04-02 DIAGNOSIS — I119 Hypertensive heart disease without heart failure: Secondary | ICD-10-CM | POA: Diagnosis not present

## 2014-04-02 DIAGNOSIS — M6281 Muscle weakness (generalized): Secondary | ICD-10-CM | POA: Diagnosis not present

## 2014-04-02 DIAGNOSIS — Z9181 History of falling: Secondary | ICD-10-CM | POA: Diagnosis not present

## 2014-04-04 DIAGNOSIS — M6281 Muscle weakness (generalized): Secondary | ICD-10-CM | POA: Diagnosis not present

## 2014-04-04 DIAGNOSIS — I119 Hypertensive heart disease without heart failure: Secondary | ICD-10-CM | POA: Diagnosis not present

## 2014-04-04 DIAGNOSIS — Z9181 History of falling: Secondary | ICD-10-CM | POA: Diagnosis not present

## 2014-04-04 DIAGNOSIS — F0390 Unspecified dementia without behavioral disturbance: Secondary | ICD-10-CM | POA: Diagnosis not present

## 2014-04-05 DIAGNOSIS — M6281 Muscle weakness (generalized): Secondary | ICD-10-CM | POA: Diagnosis not present

## 2014-04-05 DIAGNOSIS — I119 Hypertensive heart disease without heart failure: Secondary | ICD-10-CM | POA: Diagnosis not present

## 2014-04-05 DIAGNOSIS — F0390 Unspecified dementia without behavioral disturbance: Secondary | ICD-10-CM | POA: Diagnosis not present

## 2014-04-05 DIAGNOSIS — Z9181 History of falling: Secondary | ICD-10-CM | POA: Diagnosis not present

## 2014-04-09 DIAGNOSIS — I119 Hypertensive heart disease without heart failure: Secondary | ICD-10-CM | POA: Diagnosis not present

## 2014-04-09 DIAGNOSIS — M6281 Muscle weakness (generalized): Secondary | ICD-10-CM | POA: Diagnosis not present

## 2014-04-09 DIAGNOSIS — Z9181 History of falling: Secondary | ICD-10-CM | POA: Diagnosis not present

## 2014-04-09 DIAGNOSIS — F0390 Unspecified dementia without behavioral disturbance: Secondary | ICD-10-CM | POA: Diagnosis not present

## 2014-04-10 DIAGNOSIS — I119 Hypertensive heart disease without heart failure: Secondary | ICD-10-CM | POA: Diagnosis not present

## 2014-04-10 DIAGNOSIS — M6281 Muscle weakness (generalized): Secondary | ICD-10-CM | POA: Diagnosis not present

## 2014-04-10 DIAGNOSIS — Z9181 History of falling: Secondary | ICD-10-CM | POA: Diagnosis not present

## 2014-04-10 DIAGNOSIS — F0390 Unspecified dementia without behavioral disturbance: Secondary | ICD-10-CM | POA: Diagnosis not present

## 2014-04-11 DIAGNOSIS — M6281 Muscle weakness (generalized): Secondary | ICD-10-CM | POA: Diagnosis not present

## 2014-04-11 DIAGNOSIS — Z9181 History of falling: Secondary | ICD-10-CM | POA: Diagnosis not present

## 2014-04-11 DIAGNOSIS — I119 Hypertensive heart disease without heart failure: Secondary | ICD-10-CM | POA: Diagnosis not present

## 2014-04-11 DIAGNOSIS — F0390 Unspecified dementia without behavioral disturbance: Secondary | ICD-10-CM | POA: Diagnosis not present

## 2014-04-13 DIAGNOSIS — F0390 Unspecified dementia without behavioral disturbance: Secondary | ICD-10-CM | POA: Diagnosis not present

## 2014-04-13 DIAGNOSIS — I119 Hypertensive heart disease without heart failure: Secondary | ICD-10-CM | POA: Diagnosis not present

## 2014-04-13 DIAGNOSIS — M6281 Muscle weakness (generalized): Secondary | ICD-10-CM | POA: Diagnosis not present

## 2014-04-13 DIAGNOSIS — Z9181 History of falling: Secondary | ICD-10-CM | POA: Diagnosis not present

## 2014-04-16 DIAGNOSIS — M6281 Muscle weakness (generalized): Secondary | ICD-10-CM | POA: Diagnosis not present

## 2014-04-16 DIAGNOSIS — F0390 Unspecified dementia without behavioral disturbance: Secondary | ICD-10-CM | POA: Diagnosis not present

## 2014-04-16 DIAGNOSIS — I119 Hypertensive heart disease without heart failure: Secondary | ICD-10-CM | POA: Diagnosis not present

## 2014-04-16 DIAGNOSIS — Z9181 History of falling: Secondary | ICD-10-CM | POA: Diagnosis not present

## 2014-04-17 DIAGNOSIS — M6281 Muscle weakness (generalized): Secondary | ICD-10-CM | POA: Diagnosis not present

## 2014-04-17 DIAGNOSIS — I119 Hypertensive heart disease without heart failure: Secondary | ICD-10-CM | POA: Diagnosis not present

## 2014-04-17 DIAGNOSIS — Z9181 History of falling: Secondary | ICD-10-CM | POA: Diagnosis not present

## 2014-04-17 DIAGNOSIS — F0390 Unspecified dementia without behavioral disturbance: Secondary | ICD-10-CM | POA: Diagnosis not present

## 2014-04-18 DIAGNOSIS — M6281 Muscle weakness (generalized): Secondary | ICD-10-CM | POA: Diagnosis not present

## 2014-04-18 DIAGNOSIS — F0390 Unspecified dementia without behavioral disturbance: Secondary | ICD-10-CM | POA: Diagnosis not present

## 2014-04-18 DIAGNOSIS — Z9181 History of falling: Secondary | ICD-10-CM | POA: Diagnosis not present

## 2014-04-18 DIAGNOSIS — I119 Hypertensive heart disease without heart failure: Secondary | ICD-10-CM | POA: Diagnosis not present

## 2014-04-23 DIAGNOSIS — I119 Hypertensive heart disease without heart failure: Secondary | ICD-10-CM | POA: Diagnosis not present

## 2014-04-23 DIAGNOSIS — F0390 Unspecified dementia without behavioral disturbance: Secondary | ICD-10-CM | POA: Diagnosis not present

## 2014-04-23 DIAGNOSIS — Z9181 History of falling: Secondary | ICD-10-CM | POA: Diagnosis not present

## 2014-04-23 DIAGNOSIS — M6281 Muscle weakness (generalized): Secondary | ICD-10-CM | POA: Diagnosis not present

## 2014-04-24 DIAGNOSIS — C44319 Basal cell carcinoma of skin of other parts of face: Secondary | ICD-10-CM | POA: Diagnosis not present

## 2014-04-24 DIAGNOSIS — I119 Hypertensive heart disease without heart failure: Secondary | ICD-10-CM | POA: Diagnosis not present

## 2014-04-24 DIAGNOSIS — F0390 Unspecified dementia without behavioral disturbance: Secondary | ICD-10-CM | POA: Diagnosis not present

## 2014-04-24 DIAGNOSIS — M6281 Muscle weakness (generalized): Secondary | ICD-10-CM | POA: Diagnosis not present

## 2014-04-24 DIAGNOSIS — Z9181 History of falling: Secondary | ICD-10-CM | POA: Diagnosis not present

## 2014-04-25 DIAGNOSIS — I119 Hypertensive heart disease without heart failure: Secondary | ICD-10-CM | POA: Diagnosis not present

## 2014-04-25 DIAGNOSIS — Z9181 History of falling: Secondary | ICD-10-CM | POA: Diagnosis not present

## 2014-04-25 DIAGNOSIS — F0390 Unspecified dementia without behavioral disturbance: Secondary | ICD-10-CM | POA: Diagnosis not present

## 2014-04-25 DIAGNOSIS — M6281 Muscle weakness (generalized): Secondary | ICD-10-CM | POA: Diagnosis not present

## 2014-04-26 DIAGNOSIS — I119 Hypertensive heart disease without heart failure: Secondary | ICD-10-CM | POA: Diagnosis not present

## 2014-04-26 DIAGNOSIS — M6281 Muscle weakness (generalized): Secondary | ICD-10-CM | POA: Diagnosis not present

## 2014-04-26 DIAGNOSIS — F0390 Unspecified dementia without behavioral disturbance: Secondary | ICD-10-CM | POA: Diagnosis not present

## 2014-04-26 DIAGNOSIS — Z9181 History of falling: Secondary | ICD-10-CM | POA: Diagnosis not present

## 2014-04-27 DIAGNOSIS — I119 Hypertensive heart disease without heart failure: Secondary | ICD-10-CM | POA: Diagnosis not present

## 2014-04-27 DIAGNOSIS — Z9181 History of falling: Secondary | ICD-10-CM | POA: Diagnosis not present

## 2014-04-27 DIAGNOSIS — F0390 Unspecified dementia without behavioral disturbance: Secondary | ICD-10-CM | POA: Diagnosis not present

## 2014-04-27 DIAGNOSIS — M6281 Muscle weakness (generalized): Secondary | ICD-10-CM | POA: Diagnosis not present

## 2014-04-30 DIAGNOSIS — F0390 Unspecified dementia without behavioral disturbance: Secondary | ICD-10-CM | POA: Diagnosis not present

## 2014-04-30 DIAGNOSIS — I119 Hypertensive heart disease without heart failure: Secondary | ICD-10-CM | POA: Diagnosis not present

## 2014-04-30 DIAGNOSIS — Z9181 History of falling: Secondary | ICD-10-CM | POA: Diagnosis not present

## 2014-04-30 DIAGNOSIS — M6281 Muscle weakness (generalized): Secondary | ICD-10-CM | POA: Diagnosis not present

## 2014-05-02 DIAGNOSIS — I119 Hypertensive heart disease without heart failure: Secondary | ICD-10-CM | POA: Diagnosis not present

## 2014-05-02 DIAGNOSIS — F0390 Unspecified dementia without behavioral disturbance: Secondary | ICD-10-CM | POA: Diagnosis not present

## 2014-05-02 DIAGNOSIS — Z9181 History of falling: Secondary | ICD-10-CM | POA: Diagnosis not present

## 2014-05-02 DIAGNOSIS — M6281 Muscle weakness (generalized): Secondary | ICD-10-CM | POA: Diagnosis not present

## 2014-05-04 DIAGNOSIS — Z9181 History of falling: Secondary | ICD-10-CM | POA: Diagnosis not present

## 2014-05-04 DIAGNOSIS — I119 Hypertensive heart disease without heart failure: Secondary | ICD-10-CM | POA: Diagnosis not present

## 2014-05-04 DIAGNOSIS — F0390 Unspecified dementia without behavioral disturbance: Secondary | ICD-10-CM | POA: Diagnosis not present

## 2014-05-04 DIAGNOSIS — M6281 Muscle weakness (generalized): Secondary | ICD-10-CM | POA: Diagnosis not present

## 2014-05-07 DIAGNOSIS — Z9181 History of falling: Secondary | ICD-10-CM | POA: Diagnosis not present

## 2014-05-07 DIAGNOSIS — I119 Hypertensive heart disease without heart failure: Secondary | ICD-10-CM | POA: Diagnosis not present

## 2014-05-07 DIAGNOSIS — M6281 Muscle weakness (generalized): Secondary | ICD-10-CM | POA: Diagnosis not present

## 2014-05-07 DIAGNOSIS — F0390 Unspecified dementia without behavioral disturbance: Secondary | ICD-10-CM | POA: Diagnosis not present

## 2014-05-08 DIAGNOSIS — M6281 Muscle weakness (generalized): Secondary | ICD-10-CM | POA: Diagnosis not present

## 2014-05-08 DIAGNOSIS — I119 Hypertensive heart disease without heart failure: Secondary | ICD-10-CM | POA: Diagnosis not present

## 2014-05-08 DIAGNOSIS — Z9181 History of falling: Secondary | ICD-10-CM | POA: Diagnosis not present

## 2014-05-08 DIAGNOSIS — F0390 Unspecified dementia without behavioral disturbance: Secondary | ICD-10-CM | POA: Diagnosis not present

## 2014-05-09 DIAGNOSIS — Z9181 History of falling: Secondary | ICD-10-CM | POA: Diagnosis not present

## 2014-05-09 DIAGNOSIS — M6281 Muscle weakness (generalized): Secondary | ICD-10-CM | POA: Diagnosis not present

## 2014-05-09 DIAGNOSIS — I119 Hypertensive heart disease without heart failure: Secondary | ICD-10-CM | POA: Diagnosis not present

## 2014-05-09 DIAGNOSIS — F0390 Unspecified dementia without behavioral disturbance: Secondary | ICD-10-CM | POA: Diagnosis not present

## 2014-05-10 DIAGNOSIS — Z961 Presence of intraocular lens: Secondary | ICD-10-CM | POA: Diagnosis not present

## 2014-05-10 DIAGNOSIS — H353190 Nonexudative age-related macular degeneration, unspecified eye, stage unspecified: Secondary | ICD-10-CM | POA: Diagnosis not present

## 2014-05-31 ENCOUNTER — Non-Acute Institutional Stay (INDEPENDENT_AMBULATORY_CARE_PROVIDER_SITE_OTHER): Payer: Medicare Other | Admitting: Family Medicine

## 2014-05-31 DIAGNOSIS — Z789 Other specified health status: Secondary | ICD-10-CM

## 2014-05-31 DIAGNOSIS — Z593 Problems related to living in residential institution: Secondary | ICD-10-CM

## 2014-05-31 NOTE — Assessment & Plan Note (Signed)
Discharge date entered into EPIC.

## 2014-05-31 NOTE — Progress Notes (Signed)
   Subjective:    Patient ID: Savannah Todd, female    DOB: 02/06/1920, 80 y.o.   MRN: [id number]  HPI Entered discharge date into EPIC.    Review of Systems     Objective:   Physical Exam        Assessment & Plan:

## 2014-07-03 ENCOUNTER — Other Ambulatory Visit: Payer: Self-pay | Admitting: Emergency Medicine

## 2014-07-10 ENCOUNTER — Other Ambulatory Visit: Payer: Self-pay | Admitting: Emergency Medicine

## 2014-07-10 ENCOUNTER — Other Ambulatory Visit (INDEPENDENT_AMBULATORY_CARE_PROVIDER_SITE_OTHER): Payer: Medicare Other | Admitting: Family Medicine

## 2014-07-10 DIAGNOSIS — N390 Urinary tract infection, site not specified: Secondary | ICD-10-CM

## 2014-07-10 DIAGNOSIS — R350 Frequency of micturition: Secondary | ICD-10-CM | POA: Diagnosis not present

## 2014-07-10 LAB — POCT URINALYSIS DIPSTICK
Bilirubin, UA: NEGATIVE
Glucose, UA: NEGATIVE
Ketones, UA: NEGATIVE
NITRITE UA: POSITIVE
PH UA: 6
Protein, UA: NEGATIVE
SPEC GRAV UA: 1.015
UROBILINOGEN UA: 0.2

## 2014-07-10 LAB — POCT UA - MICROSCOPIC ONLY
CASTS, UR, LPF, POC: NEGATIVE
Crystals, Ur, HPF, POC: NEGATIVE
MUCUS UA: POSITIVE
YEAST UA: NEGATIVE

## 2014-07-10 MED ORDER — CEPHALEXIN 500 MG PO CAPS: 500.0000 mg | ORAL_CAPSULE | Freq: Two times a day (BID) | ORAL | Status: DC

## 2014-07-10 NOTE — Progress Notes (Unsigned)
Patient presents with increasing agitation and confusion. There is also a question whether she has a urinary tract infection. Urine was obtained with difficulty. Urine was obtained from the pad  which showed too numerous to count white cells and positive nitrite. Culture was done we'll treat with cephalexin. 500 twice a day for 7 days. We'll hold off on treatment with antidepressant or anti-anxiety drugs at the present until after she has had her urinary tract infection treated.

## 2014-07-12 LAB — URINE CULTURE: Colony Count: >=100000

## 2014-09-05 ENCOUNTER — Other Ambulatory Visit: Payer: Self-pay | Admitting: Emergency Medicine

## 2014-09-06 ENCOUNTER — Other Ambulatory Visit: Payer: Self-pay | Admitting: Emergency Medicine

## 2014-10-14 ENCOUNTER — Ambulatory Visit (INDEPENDENT_AMBULATORY_CARE_PROVIDER_SITE_OTHER): Payer: Medicare Other

## 2014-10-14 ENCOUNTER — Ambulatory Visit (INDEPENDENT_AMBULATORY_CARE_PROVIDER_SITE_OTHER): Payer: Medicare Other | Admitting: Family Medicine

## 2014-10-14 VITALS — BP 200/96 | HR 62 | Temp 98.5°F | Resp 16 | Ht 60.0 in

## 2014-10-14 DIAGNOSIS — R410 Disorientation, unspecified: Secondary | ICD-10-CM | POA: Diagnosis not present

## 2014-10-14 DIAGNOSIS — F0390 Unspecified dementia without behavioral disturbance: Secondary | ICD-10-CM

## 2014-10-14 DIAGNOSIS — S8991XA Unspecified injury of right lower leg, initial encounter: Secondary | ICD-10-CM

## 2014-10-14 DIAGNOSIS — W19XXXS Unspecified fall, sequela: Secondary | ICD-10-CM

## 2014-10-14 DIAGNOSIS — S0181XA Laceration without foreign body of other part of head, initial encounter: Secondary | ICD-10-CM

## 2014-10-14 DIAGNOSIS — Z23 Encounter for immunization: Secondary | ICD-10-CM

## 2014-10-14 LAB — POCT UA - MICROSCOPIC ONLY
Amorphous: POSITIVE
Casts, Ur, LPF, POC: NEGATIVE
Crystals, Ur, HPF, POC: NEGATIVE
Mucus, UA: NEGATIVE
RBC, urine, microscopic: NEGATIVE
Yeast, UA: NEGATIVE

## 2014-10-14 LAB — POCT URINALYSIS DIPSTICK
Bilirubin, UA: NEGATIVE
Blood, UA: NEGATIVE
Glucose, UA: NEGATIVE
Ketones, UA: NEGATIVE
Leukocytes, UA: NEGATIVE
Nitrite, UA: NEGATIVE
Spec Grav, UA: 1.01
Urobilinogen, UA: 0.2
pH, UA: 7

## 2014-10-14 LAB — POCT CBC
Granulocyte percent: 63.1 %G (ref 37–80)
HCT, POC: 39 % (ref 37.7–47.9)
Hemoglobin: 12.8 g/dL (ref 12.2–16.2)
Lymph, poc: 0.4 — AB (ref 0.6–3.4)
MCH, POC: 29 pg (ref 27–31.2)
MCHC: 32.8 g/dL (ref 31.8–35.4)
MCV: 88.7 fL (ref 80–97)
MID (cbc): 3.7 — AB (ref 0–0.9)
MPV: 6.5 fL (ref 0–99.8)
POC Granulocyte: 4.4 (ref 2–6.9)
POC LYMPH PERCENT: 29.4 %L (ref 10–50)
POC MID %: 7.5 %M (ref 0–12)
Platelet Count, POC: 288 10*3/uL (ref 142–424)
RBC: 4.4 M/uL (ref 4.04–5.48)
RDW, POC: 13.1 %
WBC: 5.8 10*3/uL (ref 4.6–10.2)

## 2014-10-14 NOTE — Progress Notes (Signed)
This 80 year old woman who fell because she didn't feel like she needed her walker or H he has an unstable gait.  Patient complains of abrasions on her forehead and laceration over the right eyebrow. She also has pain in her right knee.  Patient has experienced no syncope but does appear to be dazed.  Objective: No acute distress, patient has bright contact but does follow one-step commands. Patient is alert Patient has a 1 cm stellate laceration over the right eyebrow and a 5 mm abrasion on the right upper for head. Neck shows full range of motion and is nontender without bony abnormality or ecchymosis Cranial nerves III through XII appear to be intact Back is nontender and shows no ecchymosis, lungs are clear Heart: Regular no murmur Abdomen: Soft nontender Right knee shows ecchymosis of the lateral joint line in the anterior sectioning. There is no bony tenderness over the patella or tibia.  [facility] reading (PRIMARY) by  Dr. 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  Results for orders placed in visit on 10/14/14  POCT URINALYSIS DIPSTICK      Result Value Ref Range   Color, UA yellow     Clarity, UA clear     Glucose, UA neg     Bilirubin, UA neg     Ketones, UA neg     Spec Grav, UA 1.010     Blood, UA neg     pH, UA 7.0     Protein, UA trace     Urobilinogen, UA 0.2     Nitrite, UA neg     Leukocytes, UA Negative    POCT UA - MICROSCOPIC ONLY      Result Value Ref Range   WBC, Ur, HPF, POC 0-1     RBC, urine, microscopic neg     Bacteria, U Microscopic trace     Mucus, UA neg     Epithelial cells, urine per micros 0-1     Crystals, Ur, HPF, POC neg     Casts, Ur, LPF, POC neg     Yeast, UA neg     Amorphous pos     Please see PA note documenting facial laceration  I spoke with her son Dr. Judee Clara and her other son Retta Pitcher about her recent change in mentation. We decided that it was not in the patient's best interest to undergo CAT  scanning etc. but to rather look for easily reversible causes of change in mental status.

## 2014-10-14 NOTE — Progress Notes (Signed)
Verbal consent obtained from patient.  Local anesthesia with 3cc 2% lido with epi.  Wound scrubbed with soap and water and rinsed.  Wound closed with #1 deep dermal 5-0 vicryl and #5 5-0 ethilon simple interrupted sutures. Wound cleansed and dressed.

## 2014-10-15 LAB — TSH: TSH: 7.945 u[IU]/mL — ABNORMAL HIGH (ref 0.350–4.500)

## 2014-10-15 LAB — COMPREHENSIVE METABOLIC PANEL
ALT: 11 U/L (ref 0–35)
AST: 19 U/L (ref 0–37)
Albumin: 4.3 g/dL (ref 3.5–5.2)
Alkaline Phosphatase: 108 U/L (ref 39–117)
BUN: 14 mg/dL (ref 6–23)
CO2: 30 mEq/L (ref 19–32)
Calcium: 9.9 mg/dL (ref 8.4–10.5)
Chloride: 99 mEq/L (ref 96–112)
Creat: 0.81 mg/dL (ref 0.50–1.10)
Glucose, Bld: 107 mg/dL — ABNORMAL HIGH (ref 70–99)
Potassium: 4.3 mEq/L (ref 3.5–5.3)
Sodium: 135 mEq/L (ref 135–145)
Total Bilirubin: 0.5 mg/dL (ref 0.2–1.2)
Total Protein: 7.5 g/dL (ref 6.0–8.3)

## 2014-10-15 LAB — VITAMIN B12: Vitamin B-12: 447 pg/mL (ref 211–911)

## 2014-10-19 ENCOUNTER — Emergency Department (HOSPITAL_COMMUNITY)
Admission: EM | Admit: 2014-10-19 | Discharge: 2014-10-19 | Disposition: A | Discharge status: Home / Self Care | Payer: Medicare Other | Attending: Emergency Medicine | Admitting: Emergency Medicine

## 2014-10-19 ENCOUNTER — Emergency Department (HOSPITAL_COMMUNITY): Payer: Medicare Other

## 2014-10-19 ENCOUNTER — Encounter (HOSPITAL_COMMUNITY): Payer: Self-pay | Admitting: Emergency Medicine

## 2014-10-19 DIAGNOSIS — I10 Essential (primary) hypertension: Secondary | ICD-10-CM | POA: Insufficient documentation

## 2014-10-19 DIAGNOSIS — F0390 Unspecified dementia without behavioral disturbance: Secondary | ICD-10-CM | POA: Diagnosis not present

## 2014-10-19 DIAGNOSIS — E079 Disorder of thyroid, unspecified: Secondary | ICD-10-CM | POA: Diagnosis not present

## 2014-10-19 DIAGNOSIS — M1990 Unspecified osteoarthritis, unspecified site: Secondary | ICD-10-CM | POA: Diagnosis not present

## 2014-10-19 DIAGNOSIS — Z4802 Encounter for removal of sutures: Secondary | ICD-10-CM

## 2014-10-19 DIAGNOSIS — M6281 Muscle weakness (generalized): Secondary | ICD-10-CM | POA: Diagnosis not present

## 2014-10-19 DIAGNOSIS — W1839XA Other fall on same level, initial encounter: Secondary | ICD-10-CM | POA: Diagnosis not present

## 2014-10-19 DIAGNOSIS — Y9389 Activity, other specified: Secondary | ICD-10-CM | POA: Diagnosis not present

## 2014-10-19 DIAGNOSIS — Z79899 Other long term (current) drug therapy: Secondary | ICD-10-CM | POA: Insufficient documentation

## 2014-10-19 DIAGNOSIS — Z8659 Personal history of other mental and behavioral disorders: Secondary | ICD-10-CM | POA: Insufficient documentation

## 2014-10-19 DIAGNOSIS — R296 Repeated falls: Secondary | ICD-10-CM | POA: Diagnosis not present

## 2014-10-19 DIAGNOSIS — Z043 Encounter for examination and observation following other accident: Secondary | ICD-10-CM | POA: Insufficient documentation

## 2014-10-19 DIAGNOSIS — K219 Gastro-esophageal reflux disease without esophagitis: Secondary | ICD-10-CM | POA: Insufficient documentation

## 2014-10-19 DIAGNOSIS — N390 Urinary tract infection, site not specified: Secondary | ICD-10-CM | POA: Diagnosis not present

## 2014-10-19 DIAGNOSIS — Y92002 Bathroom of unspecified non-institutional (private) residence single-family (private) house as the place of occurrence of the external cause: Secondary | ICD-10-CM | POA: Diagnosis not present

## 2014-10-19 DIAGNOSIS — R531 Weakness: Secondary | ICD-10-CM | POA: Diagnosis not present

## 2014-10-19 DIAGNOSIS — B9689 Other specified bacterial agents as the cause of diseases classified elsewhere: Secondary | ICD-10-CM | POA: Diagnosis not present

## 2014-10-19 DIAGNOSIS — W19XXXA Unspecified fall, initial encounter: Secondary | ICD-10-CM

## 2014-10-19 LAB — CBC WITH DIFFERENTIAL/PLATELET
BASOS PCT: 1 % (ref 0–1)
Basophils Absolute: 0.1 10*3/uL (ref 0.0–0.1)
EOS ABS: 0 10*3/uL (ref 0.0–0.7)
EOS PCT: 0 % (ref 0–5)
HEMATOCRIT: 37.6 % (ref 36.0–46.0)
Hemoglobin: 12.5 g/dL (ref 12.0–15.0)
Lymphocytes Relative: 36 % (ref 12–46)
Lymphs Abs: 2.1 10*3/uL (ref 0.7–4.0)
MCH: 29.2 pg (ref 26.0–34.0)
MCHC: 33.2 g/dL (ref 30.0–36.0)
MCV: 87.9 fL (ref 78.0–100.0)
MONO ABS: 0.9 10*3/uL (ref 0.1–1.0)
MONOS PCT: 15 % — AB (ref 3–12)
NEUTROS ABS: 2.8 10*3/uL (ref 1.7–7.7)
Neutrophils Relative %: 48 % (ref 43–77)
Platelets: 270 10*3/uL (ref 150–400)
RBC: 4.28 MIL/uL (ref 3.87–5.11)
RDW: 12.6 % (ref 11.5–15.5)
WBC: 5.8 10*3/uL (ref 4.0–10.5)

## 2014-10-19 LAB — TROPONIN I

## 2014-10-19 LAB — COMPREHENSIVE METABOLIC PANEL
ALT: 12 U/L (ref 0–35)
ANION GAP: 12 (ref 5–15)
AST: 21 U/L (ref 0–37)
Albumin: 3.9 g/dL (ref 3.5–5.2)
Alkaline Phosphatase: 121 U/L — ABNORMAL HIGH (ref 39–117)
BUN: 15 mg/dL (ref 6–23)
CO2: 26 mEq/L (ref 19–32)
CREATININE: 0.73 mg/dL (ref 0.50–1.10)
Calcium: 9.5 mg/dL (ref 8.4–10.5)
Chloride: 97 mEq/L (ref 96–112)
GFR calc Af Amer: 83 mL/min — ABNORMAL LOW (ref >90)
GFR calc non Af Amer: 72 mL/min — ABNORMAL LOW (ref >90)
Glucose, Bld: 106 mg/dL — ABNORMAL HIGH (ref 70–99)
Potassium: 4.4 mEq/L (ref 3.7–5.3)
Sodium: 135 mEq/L — ABNORMAL LOW (ref 137–147)
TOTAL PROTEIN: 7.5 g/dL (ref 6.0–8.3)
Total Bilirubin: 0.2 mg/dL — ABNORMAL LOW (ref 0.3–1.2)

## 2014-10-19 LAB — URINALYSIS, ROUTINE W REFLEX MICROSCOPIC
Bilirubin Urine: NEGATIVE
GLUCOSE, UA: NEGATIVE mg/dL
KETONES UR: NEGATIVE mg/dL
Nitrite: NEGATIVE
PH: 6.5 (ref 5.0–8.0)
Protein, ur: NEGATIVE mg/dL
Specific Gravity, Urine: 1.015 (ref 1.005–1.030)
Urobilinogen, UA: 0.2 mg/dL (ref 0.0–1.0)

## 2014-10-19 LAB — URINE MICROSCOPIC-ADD ON

## 2014-10-19 MED ORDER — CEPHALEXIN 500 MG PO CAPS
500.0000 mg | ORAL_CAPSULE | Freq: Four times a day (QID) | ORAL | Status: AC
Start: 2014-10-19 — End: 2014-10-26

## 2014-10-19 MED ORDER — ACETAMINOPHEN 325 MG PO TABS
650.0000 mg | ORAL_TABLET | Freq: Once | ORAL | Status: AC
  Administered 2014-10-19: 650 mg via ORAL
  Filled 2014-10-19: qty 2

## 2014-10-19 MED ORDER — HYDRALAZINE HCL 20 MG/ML IJ SOLN
5.0000 mg | Freq: Once | INTRAMUSCULAR | Status: AC
  Administered 2014-10-19: 5 mg via INTRAVENOUS
  Filled 2014-10-19: qty 1

## 2014-10-19 MED ORDER — CEFTRIAXONE SODIUM 1 G IJ SOLR
1.0000 g | Freq: Once | INTRAMUSCULAR | Status: AC
  Administered 2014-10-19: 1 g via INTRAVENOUS
  Filled 2014-10-19: qty 10

## 2014-10-19 NOTE — Progress Notes (Signed)
  CARE MANAGEMENT ED NOTE 10/19/2014  Patient:  Savannah Todd, Savannah Todd   Account Number:  [id number]  Date Initiated:  10/19/2014  Documentation initiated by:  Livia Snellen  Subjective/Objective Assessment:   Patient presents to Ed with multiple recent falls and increased weakness.     Subjective/Objective Assessment Detail:   Patient with dementia, HTN, anxiety, IBS, GERD, arthritis, and thyroid disease.     Action/Plan:   Action/Plan Detail:   Anticipated DC Date:  10/19/2014     Status Recommendation to Physician:   Result of Recommendation:    Other ED [place]  Other  PCP issues   [facility] Choice  HOME HEALTH   Choice offered to / List presented to:  C-2 HC POA / Guardian     HH arranged  HH-1 RN  [place]      [place] agency  [place]    Status of service:  Completed, signed off  ED Comments:   ED Comments Detail:  EDCM spoke to patient and her caregiver at bedside. Patient lives at home with her caregiver.  Patient's caregiver reports patient has 24 hour care between her and two other girls who assist her.  Patient has a walker, cane, BSC, shower chair and walker at home.  Patient needs assistance with her ADL's.  Patient's caregiver reports that patient has had [facility] in th epast and were pleased with their services.  Patient has chosen [facility] for home health services again.  Discussed patient with EDP who placed home health orders and face to face for RN, PT, OT, aide and Education officer, museum.  EDCM informaed patient's caregiver that homehealth agency has 24-48 hours to contact them. EDCM provided patient's caregive with contact information of [facility].  Patient and patient's caregive thankful for assistance.  EDCM confirmed correct address and phone number with patient's caregiver.  Referral faxed to [facility].  No further EDCM needs at this time.

## 2014-10-19 NOTE — ED Notes (Signed)
Per caregiver pt has had recent of multiple recent falls and generalized weakness. Pt alert to self; hx of dementia.

## 2014-10-19 NOTE — Progress Notes (Signed)
Patient's caretaker reports patient's pcp is Dr. Everlene Farrier.  System updated.  Patient's caretaker reports patient's address is [address] [place], [place] [address].  System updated.

## 2014-10-19 NOTE — ED Notes (Signed)
Patient transported to X-ray. Blood work will be done on pt return.

## 2014-10-19 NOTE — ED Provider Notes (Signed)
CSN: [id number]     Arrival date & time 10/19/14  1659 History   First MD Initiated Contact with Patient 10/19/14 1729     Chief Complaint  Patient presents with  . Fall     (Consider location/radiation/quality/duration/timing/severity/associated sxs/prior Treatment) Patient is a 80 y.o. female presenting with fall. The history is provided by the patient.  Fall This is a recurrent problem. The current episode started 6 to 12 hours ago. Episode frequency: twice. The problem has been resolved. Pertinent negatives include no chest pain, no abdominal pain, no headaches and no shortness of breath. Nothing aggravates the symptoms. Nothing relieves the symptoms. She has tried nothing for the symptoms. The treatment provided moderate relief.    Past Medical History  Diagnosis Date  . Thyroid disease     hypothyroidism  . Hypertension   . IBS (irritable bowel syndrome)   . Aortic stenosis   . Memory disorder     mild  . Hyperlipidemia   . GERD (gastroesophageal reflux disease)   . Ulcerative colitis   . Arthritis   . Anxiety    Past Surgical History  Procedure Laterality Date  . Esophagogastroduodenoscopy  01/30/1993    with biopsy/second duodenum normal/mucosa normal/no ulcerations or any inflammatory activity/no evidence of extrinsic compression or active inflammation/fundus & cardia normal/2 to 3cm hiatal hernia/proximal esophagus normal  . Esophagogastroduodenoscopy  07/18/1991    ampulla normal/pyloric channel,prepyloric area, antrum & body normal/fundus & cardia normal/distal & proximal esophagus normal/  . Abdominal hysterectomy     Family History  Problem Relation Age of Onset  . Heart disease    . Cancer Mother   . Heart disease Father    History  Substance Use Topics  . Smoking status: Never Smoker   . Smokeless tobacco: Not on file  . Alcohol Use: No   OB History   Grav Para Term Preterm Abortions TAB SAB Ect Mult Living                 Review of Systems   Constitutional: Negative for fever and fatigue.  HENT: Negative for congestion and drooling.   Eyes: Negative for pain.  Respiratory: Negative for cough and shortness of breath.   Cardiovascular: Negative for chest pain.  Gastrointestinal: Negative for nausea, vomiting, abdominal pain and diarrhea.  Genitourinary: Negative for dysuria and hematuria.  Musculoskeletal: Negative for back pain, gait problem and neck pain.  Skin: Negative for color change.  Neurological: Negative for dizziness and headaches.       Falls  Hematological: Negative for adenopathy.  Psychiatric/Behavioral: Negative for behavioral problems.  All other systems reviewed and are negative.     Allergies  Niaspan; Bactrim; Benazepril hcl; and Naprosyn  Home Medications   Prior to Admission medications   Medication Sig Start Date End Date Taking? Authorizing Provider  acetaminophen (TYLENOL) 650 MG CR tablet Take 650 mg by mouth every 8 (eight) hours.   Yes Historical Provider, MD  Calcium Carbonate-Vitamin D (CALCIUM + D PO) Take 2 tablets by mouth daily. Calcium 500mg , vit d3 400mg    Yes Historical Provider, MD  ergocalciferol (VITAMIN D2) 50000 UNITS capsule Take 50,000 Units by mouth once a week. Every Saturday   Yes Historical Provider, MD  levothyroxine (SYNTHROID, LEVOTHROID) 125 MCG tablet Take 125 mcg by mouth daily before breakfast.   Yes Historical Provider, MD  Misc. Devices (AIRGO ROLLING [place]) MISC Use as directed. 03/28/14  Yes Darlyne Russian, MD   BP 114/70  Pulse 64  Temp(Src) 98.3 F (36.8 C) (Oral)  Resp 16  SpO2 96% Physical Exam  Nursing note and vitals reviewed. Constitutional: She appears well-developed and well-nourished.  HENT:  Head: Normocephalic.  Mouth/Throat: Oropharynx is clear and moist. No oropharyngeal exudate.  Old healing laceration with 5 sutures to the right lateral eyebrow.  Eyes: Conjunctivae and EOM are normal. Pupils are equal, round, and reactive to light.   Neck: Normal range of motion. Neck supple.  No cervical tenderness to palpation.  Cardiovascular: Normal rate, regular rhythm, normal heart sounds and intact distal pulses.  Exam reveals no gallop and no friction rub.   No murmur heard. Pulmonary/Chest: Effort normal and breath sounds normal. No respiratory distress. She has no wheezes.  Abdominal: Soft. Bowel sounds are normal. There is no tenderness. There is no rebound and no guarding.  Musculoskeletal: Normal range of motion. She exhibits no edema and no tenderness.  Neurological: She is alert.  alert, oriented x2, doesn't know year speech: normal in context and clarity memory: intact grossly cranial nerves II-XII: intact motor strength: full proximally and distally sensation: intact to light touch diffusely  cerebellar: finger-to-nose intactbilaterally gait: normal, ambulated to the bathroom   Skin: Skin is warm and dry.  Psychiatric: She has a normal mood and affect. Her behavior is normal.    ED Course  Procedures (including critical care time) Labs Review Labs Reviewed  CBC WITH DIFFERENTIAL - Abnormal; Notable for the following:    Monocytes Relative 15 (*)    All other components within normal limits  COMPREHENSIVE METABOLIC PANEL - Abnormal; Notable for the following:    Sodium 135 (*)    Glucose, Bld 106 (*)    Alkaline Phosphatase 121 (*)    Total Bilirubin 0.2 (*)    GFR calc non Af Amer 72 (*)    GFR calc Af Amer 83 (*)    All other components within normal limits  URINALYSIS, ROUTINE W REFLEX MICROSCOPIC - Abnormal; Notable for the following:    APPearance CLOUDY (*)    Hgb urine dipstick TRACE (*)    Leukocytes, UA LARGE (*)    All other components within normal limits  URINE MICROSCOPIC-ADD ON - Abnormal; Notable for the following:    Bacteria, UA FEW (*)    All other components within normal limits  URINE CULTURE  TROPONIN I    Imaging Review Dg Chest 2 View  10/19/2014   CLINICAL DATA:   Multiple recent falls. Weakness. Dementia. Hypertension.  EXAM: CHEST  2 VIEW  COMPARISON:  10/22/2013  FINDINGS: Cardiomegaly and ectasia of the thoracic ureter stable. Low lung volumes seen however both lungs are clear. No evidence of pneumothorax or pleural effusion. Several old right rib fracture deformities are noted. Old fracture deformity the right clavicle also demonstrated.  IMPRESSION: Stable cardiomegaly.  No acute findings.   Electronically Signed   By: Earle Gell M.D.   On: 10/19/2014 18:32   Ct Head Wo Contrast  10/19/2014   CLINICAL DATA:  Multiple recent falls with generalized weakness, history of dementia  EXAM: CT HEAD WITHOUT CONTRAST  TECHNIQUE: Contiguous axial images were obtained from the base of the skull through the vertex without intravenous contrast.  COMPARISON:  10/18/2013  FINDINGS: The bony calvarium is intact. Diffuse atrophic changes and chronic white matter ischemic change is identified. No findings to suggest acute hemorrhage, acute infarction or space-occupying mass lesion are noted.  IMPRESSION: Chronic changes without acute abnormality.   Electronically Signed   By: Elta Guadeloupe  Lukens M.D.   On: 10/19/2014 18:53     EKG Interpretation   Date/Time:  Friday October 19 2014 18:31:46 EDT Ventricular Rate:  58 PR Interval:  208 QRS Duration: 86 QT Interval:  439 QTC Calculation: 431 R Axis:   -41 Text Interpretation:  Sinus rhythm Inferior infarct, old No significant  change since last tracing Confirmed by Muhammadali Ries  MD, Kashus Karlen ([id number]) on  10/19/2014 6:35:22 PM     SUTURE REMOVAL Performed by: Pamella Pert, S  Consent: Verbal consent obtained. Patient identity confirmed: provided demographic data Time out: Immediately prior to procedure a "time out" was called to verify the correct patient, procedure, equipment, support staff and site/side marked as required.  Location details: right lateral eyebrow  Wound Appearance: clean  Sutures/Staples Removed:  5  Facility: sutures placed in this facility Patient tolerance: Patient tolerated the procedure well with no immediate complications.     MDM   Final diagnoses:  Fall  Encounter for removal of sutures  Generalized weakness    6:24 PM 80 y.o. female who presents with a fall and generalized weakness. The patient is here with her caregiver who states that she heard the patient fall while trying to get to the bathroom at 5 AM this morning. While trying to get her up both her and the patient fell again. The patient has been tolerating oral intake and has been ambulating with her walker since that time. She has had some generalized weakness throughout the day. She is alert and oriented 2 here and is at her baseline per the caregiver. Will get screening labs and imaging. The patient has no complaints on exam and denies any pain. Her caregiver states that she has had an intermittent headache today. Will treat with Tylenol.  Will remove pt's facial sutures which were placed 5 days ago. Will tx UTI w/ Rocephin. Also had care mgmt set up home health/pt/ot d/t recurrent falls.   8:45 PM: I interpreted/reviewed the labs and/or imaging which were non-contributory.   I have discussed the diagnosis/risks/treatment options with the patient, family and caregiver and believe the pt to be eligible for discharge home to follow-up with her pcp as needed. We also discussed returning to the ED immediately if new or worsening sx occur. We discussed the sx which are most concerning (e.g., further falls, fever) that necessitate immediate return. Medications administered to the patient during their visit and any new prescriptions provided to the patient are listed below.  Medications given during this visit Medications  cefTRIAXone (ROCEPHIN) 1 g in dextrose 5 % 50 mL IVPB (1 g Intravenous New Bag/Given 10/19/14 2022)  hydrALAZINE (APRESOLINE) injection 5 mg (not administered)  acetaminophen (TYLENOL) tablet 650 mg  (650 mg Oral Given 10/19/14 1851)    Discharge Medication List as of 10/19/2014  8:46 PM    START taking these medications   Details  cephALEXin (KEFLEX) 500 MG capsule Take 1 capsule (500 mg total) by mouth 4 (four) times daily., Starting 10/19/2014, Last dose on Fri 10/26/14, Print         Pamella Pert, MD 10/19/14 2358

## 2014-10-19 NOTE — ED Notes (Signed)
Patient went to x-ray I will perform EKG when patient return

## 2014-10-20 ENCOUNTER — Other Ambulatory Visit: Payer: Self-pay | Admitting: Emergency Medicine

## 2014-10-21 DIAGNOSIS — F0390 Unspecified dementia without behavioral disturbance: Secondary | ICD-10-CM | POA: Diagnosis not present

## 2014-10-21 DIAGNOSIS — M150 Primary generalized (osteo)arthritis: Secondary | ICD-10-CM | POA: Diagnosis not present

## 2014-10-21 DIAGNOSIS — R296 Repeated falls: Secondary | ICD-10-CM | POA: Diagnosis not present

## 2014-10-21 DIAGNOSIS — M6281 Muscle weakness (generalized): Secondary | ICD-10-CM | POA: Diagnosis not present

## 2014-10-21 DIAGNOSIS — I10 Essential (primary) hypertension: Secondary | ICD-10-CM | POA: Diagnosis not present

## 2014-10-21 DIAGNOSIS — Z9181 History of falling: Secondary | ICD-10-CM | POA: Diagnosis not present

## 2014-10-21 DIAGNOSIS — N390 Urinary tract infection, site not specified: Secondary | ICD-10-CM | POA: Diagnosis not present

## 2014-10-21 DIAGNOSIS — I350 Nonrheumatic aortic (valve) stenosis: Secondary | ICD-10-CM | POA: Diagnosis not present

## 2014-10-21 LAB — URINE CULTURE
Colony Count: NO GROWTH
Culture: NO GROWTH

## 2014-10-22 ENCOUNTER — Telehealth: Payer: Self-pay

## 2014-10-22 DIAGNOSIS — M150 Primary generalized (osteo)arthritis: Secondary | ICD-10-CM | POA: Diagnosis not present

## 2014-10-22 DIAGNOSIS — R296 Repeated falls: Secondary | ICD-10-CM | POA: Diagnosis not present

## 2014-10-22 DIAGNOSIS — N390 Urinary tract infection, site not specified: Secondary | ICD-10-CM | POA: Diagnosis not present

## 2014-10-22 DIAGNOSIS — I10 Essential (primary) hypertension: Secondary | ICD-10-CM | POA: Diagnosis not present

## 2014-10-22 DIAGNOSIS — F0390 Unspecified dementia without behavioral disturbance: Secondary | ICD-10-CM | POA: Diagnosis not present

## 2014-10-22 DIAGNOSIS — M6281 Muscle weakness (generalized): Secondary | ICD-10-CM | POA: Diagnosis not present

## 2014-10-22 NOTE — Telephone Encounter (Signed)
Dr. Everlene Farrier    Home health PT wants Dr. Everlene Farrier to know the patient was given home health care.   Her blood pressure at the ER was 200 over 100 Today it is 170 over 100   Needs an order from Dr. Everlene Farrier to do PT in home.   She has a nurse with her also.   Beckie Busing  [phone]

## 2014-10-23 ENCOUNTER — Other Ambulatory Visit: Payer: Self-pay | Admitting: Radiology

## 2014-10-23 MED ORDER — AMLODIPINE BESYLATE 5 MG PO TABS: 5.0000 mg | ORAL_TABLET | Freq: Every day | ORAL | Status: AC

## 2014-10-23 NOTE — Telephone Encounter (Signed)
Loreli Slot was notified of appt made for Thursday at 28 with Dr. Everlene Farrier. Also informed new medication sent to the pharmacy. Amlodipine 5mg  once daily.

## 2014-10-23 NOTE — Telephone Encounter (Signed)
Did they start Tamirra on blood pressure medication when she was in the emergency room ?? okay to order home PT.

## 2014-10-24 DIAGNOSIS — F0390 Unspecified dementia without behavioral disturbance: Secondary | ICD-10-CM | POA: Diagnosis not present

## 2014-10-24 DIAGNOSIS — I10 Essential (primary) hypertension: Secondary | ICD-10-CM | POA: Diagnosis not present

## 2014-10-24 DIAGNOSIS — M6281 Muscle weakness (generalized): Secondary | ICD-10-CM | POA: Diagnosis not present

## 2014-10-24 DIAGNOSIS — R296 Repeated falls: Secondary | ICD-10-CM | POA: Diagnosis not present

## 2014-10-24 DIAGNOSIS — M150 Primary generalized (osteo)arthritis: Secondary | ICD-10-CM | POA: Diagnosis not present

## 2014-10-24 DIAGNOSIS — N390 Urinary tract infection, site not specified: Secondary | ICD-10-CM | POA: Diagnosis not present

## 2014-10-25 ENCOUNTER — Encounter: Payer: Self-pay | Admitting: Emergency Medicine

## 2014-10-25 ENCOUNTER — Ambulatory Visit (INDEPENDENT_AMBULATORY_CARE_PROVIDER_SITE_OTHER): Payer: Medicare Other

## 2014-10-25 ENCOUNTER — Ambulatory Visit (INDEPENDENT_AMBULATORY_CARE_PROVIDER_SITE_OTHER): Payer: Medicare Other | Admitting: Emergency Medicine

## 2014-10-25 VITALS — BP 177/88 | HR 66 | Temp 98.3°F | Resp 16 | Ht 61.0 in | Wt 124.0 lb

## 2014-10-25 DIAGNOSIS — R0781 Pleurodynia: Secondary | ICD-10-CM | POA: Diagnosis not present

## 2014-10-25 MED ORDER — LORAZEPAM 0.5 MG PO TABS: ORAL_TABLET | ORAL | Status: DC

## 2014-10-25 NOTE — Progress Notes (Addendum)
Subjective:  This chart was scribed for Darlyne Russian, MD by Ladene Artist, ED Scribe. The patient was seen in room 28. Patient's care was started at 11:50 AM.   Patient ID: Savannah Todd, female    DOB: [birth year], 80 y.o.   MRN: [id number]  Chief Complaint  Patient presents with  . Hypertension    follow up   HPI HPI Comments: Savannah Todd is a 80 y.o. female, with a h/o HTN, hypothyroidism, hyperlipidemia, aortic stenosis, dementia, anxiety, who presents to the [facility] for follow-up regarding HTN. Pt started BP medication 2 days ago. BP during exam: 170/80.  Falls Pt's son reports that pt has had increasing problems with confusion. Pt gets up multiple times during the night. She has also experienced frequent falling episodes. Pt's son would like to place the pt on a mild medication to sudate her. He is afraid that her caregiver will refuse to continue to be her caregiver. He understands the risk of falls and is willing to accept that since she has already fallen multiple times. Pt refuses to use her walker at night. Pt denies any pain at this time. She states that she falls often but is unsure why. She reports that she just stumbles but denies weakness or her legs giving out.   Hearing Loss Pt's son reports gradually worsening hearing loss. Pt has eye exams done annually.   Immunizations Flu shot is UTD.   Past Medical History  Diagnosis Date  . Thyroid disease     hypothyroidism  . Hypertension   . IBS (irritable bowel syndrome)   . Aortic stenosis   . Memory disorder     mild  . Hyperlipidemia   . GERD (gastroesophageal reflux disease)   . Ulcerative colitis   . Arthritis   . Anxiety    Current Outpatient Prescriptions on File Prior to Visit  Medication Sig Dispense Refill  . acetaminophen (TYLENOL) 650 MG CR tablet Take 650 mg by mouth every 8 (eight) hours.    Marland Kitchen amLODipine (NORVASC) 5 MG tablet Take 1 tablet (5 mg total) by mouth daily. 90  tablet 3  . CALCIUM CITRATE + D 250-200 MG-UNIT TABS TAKE 2 TABLETS BY MOUTH EVERY DAY 180 each 0  . cephALEXin (KEFLEX) 500 MG capsule Take 1 capsule (500 mg total) by mouth 4 (four) times daily. 28 capsule 0  . ergocalciferol (VITAMIN D2) 50000 UNITS capsule Take 50,000 Units by mouth once a week. Every Saturday    . levothyroxine (SYNTHROID, LEVOTHROID) 125 MCG tablet Take 125 mcg by mouth daily before breakfast.    . Misc. Devices (AIRGO ROLLING [place]) MISC Use as directed. 1 each 0   No current facility-administered medications on file prior to visit.   Allergies  Allergen Reactions  . Niaspan [Niacin Er] Itching  . Bactrim Nausea Only  . Benazepril Hcl Cough  . Naprosyn [Naproxen] Itching   Review of Systems  Constitutional: Negative for fever and chills.  HENT: Positive for hearing loss.   Eyes: Negative for visual disturbance.  Musculoskeletal: Negative for myalgias and arthralgias.  Neurological: Negative for dizziness, weakness and light-headedness.  Psychiatric/Behavioral: Positive for confusion. The patient is nervous/anxious.       Objective:   Physical Exam  BP: 170/80 CONSTITUTIONAL: Well developed/well nourished HEAD: Normocephalic/atraumatic EYES: EOMI/PERRL ENMT: Mucous membranes moist NECK: supple no meningeal signs SPINE:entire spine nontender CV: S1/S2 noted, no rubs/gallops noted, 2/6 systolic murmur at the base of the heart  LUNGS: Lungs are clear to auscultation bilaterally, no apparent distress ABDOMEN: soft, nontender, no rebound or guarding GU:no cva tenderness NEURO: Pt is awake/alert, moves all extremitiesx4 EXTREMITIES: pulses normal, full ROM MSK: significant tenderness over R posterior ribs T8-T10 SKIN: warm, color normal PSYCH: no abnormalities of mood noted   [facility] reading (PRIMARY) by  Dr. Everlene Farrier there are multiple old rib fractures on the right but the lowermost rib which is broken appears new   Assessment & Plan:  She will be on Tylenol  for pain. Has started Ativan 0.5 mg to take a half a tablet in the morning and a half a tablet at bedtime reevaluate in 2 weeks. She is currently on amlodipine 2.5 for blood pressure which is not at goal but just started 2 days ago for reevaluate in 2 weeks and decide on increasing the dose.according to the son she had her flu shot at the hospital I personally performed the services described in this documentation, which was scribed in my presence. The recorded information has been reviewed and is accurate.

## 2014-10-25 NOTE — Addendum Note (Signed)
Addended by: Constance Goltz on: 10/25/2014 03:35 PM   Modules accepted: Orders

## 2014-10-26 DIAGNOSIS — N390 Urinary tract infection, site not specified: Secondary | ICD-10-CM | POA: Diagnosis not present

## 2014-10-26 DIAGNOSIS — R296 Repeated falls: Secondary | ICD-10-CM | POA: Diagnosis not present

## 2014-10-26 DIAGNOSIS — I10 Essential (primary) hypertension: Secondary | ICD-10-CM | POA: Diagnosis not present

## 2014-10-26 DIAGNOSIS — F0390 Unspecified dementia without behavioral disturbance: Secondary | ICD-10-CM | POA: Diagnosis not present

## 2014-10-26 DIAGNOSIS — M6281 Muscle weakness (generalized): Secondary | ICD-10-CM | POA: Diagnosis not present

## 2014-10-26 DIAGNOSIS — M150 Primary generalized (osteo)arthritis: Secondary | ICD-10-CM | POA: Diagnosis not present

## 2014-10-29 DIAGNOSIS — F0390 Unspecified dementia without behavioral disturbance: Secondary | ICD-10-CM | POA: Diagnosis not present

## 2014-10-29 DIAGNOSIS — R296 Repeated falls: Secondary | ICD-10-CM | POA: Diagnosis not present

## 2014-10-29 DIAGNOSIS — M150 Primary generalized (osteo)arthritis: Secondary | ICD-10-CM | POA: Diagnosis not present

## 2014-10-29 DIAGNOSIS — M6281 Muscle weakness (generalized): Secondary | ICD-10-CM | POA: Diagnosis not present

## 2014-10-29 DIAGNOSIS — N390 Urinary tract infection, site not specified: Secondary | ICD-10-CM | POA: Diagnosis not present

## 2014-10-29 DIAGNOSIS — I10 Essential (primary) hypertension: Secondary | ICD-10-CM | POA: Diagnosis not present

## 2014-10-31 DIAGNOSIS — R296 Repeated falls: Secondary | ICD-10-CM | POA: Diagnosis not present

## 2014-10-31 DIAGNOSIS — F0390 Unspecified dementia without behavioral disturbance: Secondary | ICD-10-CM | POA: Diagnosis not present

## 2014-10-31 DIAGNOSIS — M6281 Muscle weakness (generalized): Secondary | ICD-10-CM | POA: Diagnosis not present

## 2014-10-31 DIAGNOSIS — I10 Essential (primary) hypertension: Secondary | ICD-10-CM | POA: Diagnosis not present

## 2014-10-31 DIAGNOSIS — M150 Primary generalized (osteo)arthritis: Secondary | ICD-10-CM | POA: Diagnosis not present

## 2014-10-31 DIAGNOSIS — N390 Urinary tract infection, site not specified: Secondary | ICD-10-CM | POA: Diagnosis not present

## 2014-11-02 DIAGNOSIS — F0390 Unspecified dementia without behavioral disturbance: Secondary | ICD-10-CM | POA: Diagnosis not present

## 2014-11-02 DIAGNOSIS — I10 Essential (primary) hypertension: Secondary | ICD-10-CM | POA: Diagnosis not present

## 2014-11-02 DIAGNOSIS — R296 Repeated falls: Secondary | ICD-10-CM | POA: Diagnosis not present

## 2014-11-02 DIAGNOSIS — M6281 Muscle weakness (generalized): Secondary | ICD-10-CM | POA: Diagnosis not present

## 2014-11-02 DIAGNOSIS — M150 Primary generalized (osteo)arthritis: Secondary | ICD-10-CM | POA: Diagnosis not present

## 2014-11-02 DIAGNOSIS — N390 Urinary tract infection, site not specified: Secondary | ICD-10-CM | POA: Diagnosis not present

## 2014-11-05 DIAGNOSIS — F0390 Unspecified dementia without behavioral disturbance: Secondary | ICD-10-CM | POA: Diagnosis not present

## 2014-11-05 DIAGNOSIS — M6281 Muscle weakness (generalized): Secondary | ICD-10-CM | POA: Diagnosis not present

## 2014-11-05 DIAGNOSIS — I10 Essential (primary) hypertension: Secondary | ICD-10-CM | POA: Diagnosis not present

## 2014-11-05 DIAGNOSIS — R296 Repeated falls: Secondary | ICD-10-CM | POA: Diagnosis not present

## 2014-11-05 DIAGNOSIS — M150 Primary generalized (osteo)arthritis: Secondary | ICD-10-CM | POA: Diagnosis not present

## 2014-11-05 DIAGNOSIS — N390 Urinary tract infection, site not specified: Secondary | ICD-10-CM | POA: Diagnosis not present

## 2014-11-06 ENCOUNTER — Telehealth: Payer: Self-pay | Admitting: Emergency Medicine

## 2014-11-06 ENCOUNTER — Other Ambulatory Visit: Payer: Self-pay | Admitting: Emergency Medicine

## 2014-11-06 DIAGNOSIS — M150 Primary generalized (osteo)arthritis: Secondary | ICD-10-CM | POA: Diagnosis not present

## 2014-11-06 DIAGNOSIS — M6281 Muscle weakness (generalized): Secondary | ICD-10-CM | POA: Diagnosis not present

## 2014-11-06 DIAGNOSIS — N390 Urinary tract infection, site not specified: Secondary | ICD-10-CM | POA: Diagnosis not present

## 2014-11-06 DIAGNOSIS — I10 Essential (primary) hypertension: Secondary | ICD-10-CM | POA: Diagnosis not present

## 2014-11-06 DIAGNOSIS — G4700 Insomnia, unspecified: Secondary | ICD-10-CM

## 2014-11-06 DIAGNOSIS — R296 Repeated falls: Secondary | ICD-10-CM | POA: Diagnosis not present

## 2014-11-06 DIAGNOSIS — F0390 Unspecified dementia without behavioral disturbance: Secondary | ICD-10-CM | POA: Diagnosis not present

## 2014-11-06 MED ORDER — MIRTAZAPINE 15 MG PO TBDP: 15.0000 mg | ORAL_TABLET | Freq: Every day | ORAL | Status: DC

## 2014-11-06 MED ORDER — DONEPEZIL HCL 5 MG PO TABS: 5.0000 mg | ORAL_TABLET | Freq: Every day | ORAL | Status: DC

## 2014-11-06 NOTE — Telephone Encounter (Signed)
Will try Aricept 5 mg at bedtime. Patient has become increasingly confused. At times she does not recognize her sons. She is started to wander and go outside at night. I told them to consider a locked care facility. They do not want to do that at the present time. We'll tried Aricept. We'll also contact one the geriatric specialist and get some advice.

## 2014-11-06 NOTE — Telephone Encounter (Signed)
I talked with Dr. Tamala Julian. We have decided not to give her the Aricept. She will be on Remeron 15 mg  one at night. We discussed the issue about falling are well aware that she could have increased falls and will do their best to keep her safety a priority.

## 2014-11-08 DIAGNOSIS — M150 Primary generalized (osteo)arthritis: Secondary | ICD-10-CM | POA: Diagnosis not present

## 2014-11-08 DIAGNOSIS — R296 Repeated falls: Secondary | ICD-10-CM | POA: Diagnosis not present

## 2014-11-08 DIAGNOSIS — M6281 Muscle weakness (generalized): Secondary | ICD-10-CM | POA: Diagnosis not present

## 2014-11-08 DIAGNOSIS — I10 Essential (primary) hypertension: Secondary | ICD-10-CM | POA: Diagnosis not present

## 2014-11-08 DIAGNOSIS — F0390 Unspecified dementia without behavioral disturbance: Secondary | ICD-10-CM | POA: Diagnosis not present

## 2014-11-08 DIAGNOSIS — N390 Urinary tract infection, site not specified: Secondary | ICD-10-CM | POA: Diagnosis not present

## 2014-11-12 DIAGNOSIS — I10 Essential (primary) hypertension: Secondary | ICD-10-CM | POA: Diagnosis not present

## 2014-11-12 DIAGNOSIS — N390 Urinary tract infection, site not specified: Secondary | ICD-10-CM | POA: Diagnosis not present

## 2014-11-12 DIAGNOSIS — F0390 Unspecified dementia without behavioral disturbance: Secondary | ICD-10-CM | POA: Diagnosis not present

## 2014-11-12 DIAGNOSIS — R296 Repeated falls: Secondary | ICD-10-CM | POA: Diagnosis not present

## 2014-11-12 DIAGNOSIS — M150 Primary generalized (osteo)arthritis: Secondary | ICD-10-CM | POA: Diagnosis not present

## 2014-11-12 DIAGNOSIS — M6281 Muscle weakness (generalized): Secondary | ICD-10-CM | POA: Diagnosis not present

## 2014-11-13 DIAGNOSIS — I10 Essential (primary) hypertension: Secondary | ICD-10-CM | POA: Diagnosis not present

## 2014-11-13 DIAGNOSIS — N390 Urinary tract infection, site not specified: Secondary | ICD-10-CM | POA: Diagnosis not present

## 2014-11-13 DIAGNOSIS — F0390 Unspecified dementia without behavioral disturbance: Secondary | ICD-10-CM | POA: Diagnosis not present

## 2014-11-13 DIAGNOSIS — M6281 Muscle weakness (generalized): Secondary | ICD-10-CM | POA: Diagnosis not present

## 2014-11-13 DIAGNOSIS — M150 Primary generalized (osteo)arthritis: Secondary | ICD-10-CM | POA: Diagnosis not present

## 2014-11-13 DIAGNOSIS — R296 Repeated falls: Secondary | ICD-10-CM | POA: Diagnosis not present

## 2014-11-14 DIAGNOSIS — N390 Urinary tract infection, site not specified: Secondary | ICD-10-CM | POA: Diagnosis not present

## 2014-11-14 DIAGNOSIS — F0390 Unspecified dementia without behavioral disturbance: Secondary | ICD-10-CM | POA: Diagnosis not present

## 2014-11-14 DIAGNOSIS — M6281 Muscle weakness (generalized): Secondary | ICD-10-CM | POA: Diagnosis not present

## 2014-11-14 DIAGNOSIS — R296 Repeated falls: Secondary | ICD-10-CM | POA: Diagnosis not present

## 2014-11-14 DIAGNOSIS — M150 Primary generalized (osteo)arthritis: Secondary | ICD-10-CM | POA: Diagnosis not present

## 2014-11-14 DIAGNOSIS — I10 Essential (primary) hypertension: Secondary | ICD-10-CM | POA: Diagnosis not present

## 2014-11-19 DIAGNOSIS — M6281 Muscle weakness (generalized): Secondary | ICD-10-CM | POA: Diagnosis not present

## 2014-11-19 DIAGNOSIS — R296 Repeated falls: Secondary | ICD-10-CM | POA: Diagnosis not present

## 2014-11-19 DIAGNOSIS — F0390 Unspecified dementia without behavioral disturbance: Secondary | ICD-10-CM | POA: Diagnosis not present

## 2014-11-19 DIAGNOSIS — M150 Primary generalized (osteo)arthritis: Secondary | ICD-10-CM | POA: Diagnosis not present

## 2014-11-19 DIAGNOSIS — N390 Urinary tract infection, site not specified: Secondary | ICD-10-CM | POA: Diagnosis not present

## 2014-11-19 DIAGNOSIS — I10 Essential (primary) hypertension: Secondary | ICD-10-CM | POA: Diagnosis not present

## 2014-11-20 DIAGNOSIS — F0390 Unspecified dementia without behavioral disturbance: Secondary | ICD-10-CM | POA: Diagnosis not present

## 2014-11-20 DIAGNOSIS — N390 Urinary tract infection, site not specified: Secondary | ICD-10-CM | POA: Diagnosis not present

## 2014-11-20 DIAGNOSIS — I10 Essential (primary) hypertension: Secondary | ICD-10-CM | POA: Diagnosis not present

## 2014-11-20 DIAGNOSIS — M150 Primary generalized (osteo)arthritis: Secondary | ICD-10-CM | POA: Diagnosis not present

## 2014-11-20 DIAGNOSIS — M6281 Muscle weakness (generalized): Secondary | ICD-10-CM | POA: Diagnosis not present

## 2014-11-20 DIAGNOSIS — R296 Repeated falls: Secondary | ICD-10-CM | POA: Diagnosis not present

## 2014-11-21 DIAGNOSIS — N390 Urinary tract infection, site not specified: Secondary | ICD-10-CM | POA: Diagnosis not present

## 2014-11-21 DIAGNOSIS — I10 Essential (primary) hypertension: Secondary | ICD-10-CM | POA: Diagnosis not present

## 2014-11-21 DIAGNOSIS — M150 Primary generalized (osteo)arthritis: Secondary | ICD-10-CM | POA: Diagnosis not present

## 2014-11-21 DIAGNOSIS — F0390 Unspecified dementia without behavioral disturbance: Secondary | ICD-10-CM | POA: Diagnosis not present

## 2014-11-21 DIAGNOSIS — R296 Repeated falls: Secondary | ICD-10-CM | POA: Diagnosis not present

## 2014-11-21 DIAGNOSIS — M6281 Muscle weakness (generalized): Secondary | ICD-10-CM | POA: Diagnosis not present

## 2014-11-22 DIAGNOSIS — F0390 Unspecified dementia without behavioral disturbance: Secondary | ICD-10-CM | POA: Diagnosis not present

## 2014-11-22 DIAGNOSIS — N390 Urinary tract infection, site not specified: Secondary | ICD-10-CM | POA: Diagnosis not present

## 2014-11-22 DIAGNOSIS — R296 Repeated falls: Secondary | ICD-10-CM | POA: Diagnosis not present

## 2014-11-22 DIAGNOSIS — I10 Essential (primary) hypertension: Secondary | ICD-10-CM | POA: Diagnosis not present

## 2014-11-22 DIAGNOSIS — M150 Primary generalized (osteo)arthritis: Secondary | ICD-10-CM | POA: Diagnosis not present

## 2014-11-22 DIAGNOSIS — M6281 Muscle weakness (generalized): Secondary | ICD-10-CM | POA: Diagnosis not present

## 2014-11-24 ENCOUNTER — Other Ambulatory Visit: Payer: Self-pay | Admitting: *Deleted

## 2014-11-24 ENCOUNTER — Other Ambulatory Visit (INDEPENDENT_AMBULATORY_CARE_PROVIDER_SITE_OTHER): Payer: Medicare Other | Admitting: *Deleted

## 2014-11-24 DIAGNOSIS — R300 Dysuria: Secondary | ICD-10-CM | POA: Diagnosis not present

## 2014-11-24 DIAGNOSIS — M150 Primary generalized (osteo)arthritis: Secondary | ICD-10-CM | POA: Diagnosis not present

## 2014-11-24 DIAGNOSIS — F0390 Unspecified dementia without behavioral disturbance: Secondary | ICD-10-CM | POA: Diagnosis not present

## 2014-11-24 DIAGNOSIS — I10 Essential (primary) hypertension: Secondary | ICD-10-CM | POA: Diagnosis not present

## 2014-11-24 DIAGNOSIS — R296 Repeated falls: Secondary | ICD-10-CM | POA: Diagnosis not present

## 2014-11-24 DIAGNOSIS — N390 Urinary tract infection, site not specified: Secondary | ICD-10-CM | POA: Diagnosis not present

## 2014-11-24 DIAGNOSIS — M6281 Muscle weakness (generalized): Secondary | ICD-10-CM | POA: Diagnosis not present

## 2014-11-25 ENCOUNTER — Other Ambulatory Visit: Payer: Self-pay | Admitting: *Deleted

## 2014-11-25 DIAGNOSIS — N390 Urinary tract infection, site not specified: Secondary | ICD-10-CM

## 2014-11-25 DIAGNOSIS — R319 Hematuria, unspecified: Principal | ICD-10-CM

## 2014-11-25 DIAGNOSIS — R300 Dysuria: Secondary | ICD-10-CM | POA: Diagnosis not present

## 2014-11-25 LAB — POCT URINALYSIS DIPSTICK
Bilirubin, UA: NEGATIVE
GLUCOSE UA: NEGATIVE
Ketones, UA: NEGATIVE
NITRITE UA: POSITIVE
PH UA: 6.5
Protein, UA: NEGATIVE
Spec Grav, UA: 1.01
UROBILINOGEN UA: 0.2

## 2014-11-25 LAB — POCT UA - MICROSCOPIC ONLY
CASTS, UR, LPF, POC: NEGATIVE
CRYSTALS, UR, HPF, POC: NEGATIVE
Mucus, UA: NEGATIVE
YEAST UA: NEGATIVE

## 2014-11-25 MED ORDER — CEPHALEXIN 500 MG PO CAPS: 500.0000 mg | ORAL_CAPSULE | Freq: Two times a day (BID) | ORAL | Status: AC

## 2014-11-26 ENCOUNTER — Telehealth: Payer: Self-pay | Admitting: *Deleted

## 2014-11-26 DIAGNOSIS — R296 Repeated falls: Secondary | ICD-10-CM | POA: Diagnosis not present

## 2014-11-26 DIAGNOSIS — F0390 Unspecified dementia without behavioral disturbance: Secondary | ICD-10-CM | POA: Diagnosis not present

## 2014-11-26 DIAGNOSIS — M150 Primary generalized (osteo)arthritis: Secondary | ICD-10-CM | POA: Diagnosis not present

## 2014-11-26 DIAGNOSIS — N390 Urinary tract infection, site not specified: Secondary | ICD-10-CM | POA: Diagnosis not present

## 2014-11-26 DIAGNOSIS — G4700 Insomnia, unspecified: Secondary | ICD-10-CM

## 2014-11-26 DIAGNOSIS — M6281 Muscle weakness (generalized): Secondary | ICD-10-CM | POA: Diagnosis not present

## 2014-11-26 DIAGNOSIS — I10 Essential (primary) hypertension: Secondary | ICD-10-CM | POA: Diagnosis not present

## 2014-11-26 MED ORDER — MIRTAZAPINE 15 MG PO TBDP: 15.0000 mg | ORAL_TABLET | Freq: Every day | ORAL | Status: DC

## 2014-11-27 DIAGNOSIS — M6281 Muscle weakness (generalized): Secondary | ICD-10-CM | POA: Diagnosis not present

## 2014-11-27 DIAGNOSIS — M150 Primary generalized (osteo)arthritis: Secondary | ICD-10-CM | POA: Diagnosis not present

## 2014-11-27 DIAGNOSIS — F0390 Unspecified dementia without behavioral disturbance: Secondary | ICD-10-CM | POA: Diagnosis not present

## 2014-11-27 DIAGNOSIS — R296 Repeated falls: Secondary | ICD-10-CM | POA: Diagnosis not present

## 2014-11-27 DIAGNOSIS — I10 Essential (primary) hypertension: Secondary | ICD-10-CM | POA: Diagnosis not present

## 2014-11-27 DIAGNOSIS — N390 Urinary tract infection, site not specified: Secondary | ICD-10-CM | POA: Diagnosis not present

## 2014-11-27 LAB — URINE CULTURE: Colony Count: >=100000

## 2014-11-28 DIAGNOSIS — M6281 Muscle weakness (generalized): Secondary | ICD-10-CM | POA: Diagnosis not present

## 2014-11-28 DIAGNOSIS — R296 Repeated falls: Secondary | ICD-10-CM | POA: Diagnosis not present

## 2014-11-28 DIAGNOSIS — M150 Primary generalized (osteo)arthritis: Secondary | ICD-10-CM | POA: Diagnosis not present

## 2014-11-28 DIAGNOSIS — I10 Essential (primary) hypertension: Secondary | ICD-10-CM | POA: Diagnosis not present

## 2014-11-28 DIAGNOSIS — F0390 Unspecified dementia without behavioral disturbance: Secondary | ICD-10-CM | POA: Diagnosis not present

## 2014-11-28 DIAGNOSIS — N390 Urinary tract infection, site not specified: Secondary | ICD-10-CM | POA: Diagnosis not present

## 2014-12-01 ENCOUNTER — Other Ambulatory Visit: Payer: Self-pay | Admitting: Physician Assistant

## 2014-12-03 DIAGNOSIS — M150 Primary generalized (osteo)arthritis: Secondary | ICD-10-CM | POA: Diagnosis not present

## 2014-12-03 DIAGNOSIS — N390 Urinary tract infection, site not specified: Secondary | ICD-10-CM | POA: Diagnosis not present

## 2014-12-03 DIAGNOSIS — F0390 Unspecified dementia without behavioral disturbance: Secondary | ICD-10-CM | POA: Diagnosis not present

## 2014-12-03 DIAGNOSIS — R296 Repeated falls: Secondary | ICD-10-CM | POA: Diagnosis not present

## 2014-12-03 DIAGNOSIS — I10 Essential (primary) hypertension: Secondary | ICD-10-CM | POA: Diagnosis not present

## 2014-12-03 DIAGNOSIS — M6281 Muscle weakness (generalized): Secondary | ICD-10-CM | POA: Diagnosis not present

## 2014-12-05 DIAGNOSIS — I10 Essential (primary) hypertension: Secondary | ICD-10-CM | POA: Diagnosis not present

## 2014-12-05 DIAGNOSIS — R296 Repeated falls: Secondary | ICD-10-CM | POA: Diagnosis not present

## 2014-12-05 DIAGNOSIS — M150 Primary generalized (osteo)arthritis: Secondary | ICD-10-CM | POA: Diagnosis not present

## 2014-12-05 DIAGNOSIS — M6281 Muscle weakness (generalized): Secondary | ICD-10-CM | POA: Diagnosis not present

## 2014-12-05 DIAGNOSIS — F0390 Unspecified dementia without behavioral disturbance: Secondary | ICD-10-CM | POA: Diagnosis not present

## 2014-12-05 DIAGNOSIS — N390 Urinary tract infection, site not specified: Secondary | ICD-10-CM | POA: Diagnosis not present

## 2014-12-28 ENCOUNTER — Emergency Department (HOSPITAL_COMMUNITY): Payer: Medicare Other

## 2014-12-28 ENCOUNTER — Encounter (HOSPITAL_COMMUNITY): Payer: Self-pay | Admitting: Emergency Medicine

## 2014-12-28 ENCOUNTER — Emergency Department (HOSPITAL_COMMUNITY)
Admission: EM | Admit: 2014-12-28 | Discharge: 2014-12-28 | Disposition: A | Discharge status: Home / Self Care | Payer: Medicare Other | Attending: Emergency Medicine | Admitting: Emergency Medicine

## 2014-12-28 DIAGNOSIS — Y9289 Other specified places as the place of occurrence of the external cause: Secondary | ICD-10-CM | POA: Diagnosis not present

## 2014-12-28 DIAGNOSIS — W1839XA Other fall on same level, initial encounter: Secondary | ICD-10-CM | POA: Insufficient documentation

## 2014-12-28 DIAGNOSIS — S0181XA Laceration without foreign body of other part of head, initial encounter: Secondary | ICD-10-CM | POA: Insufficient documentation

## 2014-12-28 DIAGNOSIS — Y9389 Activity, other specified: Secondary | ICD-10-CM | POA: Insufficient documentation

## 2014-12-28 DIAGNOSIS — E039 Hypothyroidism, unspecified: Secondary | ICD-10-CM | POA: Insufficient documentation

## 2014-12-28 DIAGNOSIS — Z8719 Personal history of other diseases of the digestive system: Secondary | ICD-10-CM | POA: Insufficient documentation

## 2014-12-28 DIAGNOSIS — M1990 Unspecified osteoarthritis, unspecified site: Secondary | ICD-10-CM | POA: Diagnosis not present

## 2014-12-28 DIAGNOSIS — Z79899 Other long term (current) drug therapy: Secondary | ICD-10-CM | POA: Diagnosis not present

## 2014-12-28 DIAGNOSIS — F419 Anxiety disorder, unspecified: Secondary | ICD-10-CM | POA: Insufficient documentation

## 2014-12-28 DIAGNOSIS — Y998 Other external cause status: Secondary | ICD-10-CM | POA: Insufficient documentation

## 2014-12-28 DIAGNOSIS — I10 Essential (primary) hypertension: Secondary | ICD-10-CM | POA: Diagnosis not present

## 2014-12-28 DIAGNOSIS — T1490XA Injury, unspecified, initial encounter: Secondary | ICD-10-CM

## 2014-12-28 DIAGNOSIS — Z7902 Long term (current) use of antithrombotics/antiplatelets: Secondary | ICD-10-CM | POA: Insufficient documentation

## 2014-12-28 LAB — I-STAT CHEM 8, ED
BUN: 16 mg/dL (ref 6–23)
Calcium, Ion: 1.18 mmol/L (ref 1.13–1.30)
Chloride: 101 mEq/L (ref 96–112)
Creatinine, Ser: 0.8 mg/dL (ref 0.50–1.10)
GLUCOSE: 108 mg/dL — AB (ref 70–99)
HCT: 38 % (ref 36.0–46.0)
Hemoglobin: 12.9 g/dL (ref 12.0–15.0)
Potassium: 4 mmol/L (ref 3.5–5.1)
Sodium: 139 mmol/L (ref 135–145)
TCO2: 24 mmol/L (ref 0–100)

## 2014-12-28 LAB — URINALYSIS, ROUTINE W REFLEX MICROSCOPIC
Bilirubin Urine: NEGATIVE
Glucose, UA: NEGATIVE mg/dL
Ketones, ur: NEGATIVE mg/dL
NITRITE: POSITIVE — AB
PH: 7 (ref 5.0–8.0)
PROTEIN: NEGATIVE mg/dL
Specific Gravity, Urine: 1.008 (ref 1.005–1.030)
UROBILINOGEN UA: 0.2 mg/dL (ref 0.0–1.0)

## 2014-12-28 LAB — URINE MICROSCOPIC-ADD ON

## 2014-12-28 MED ORDER — LIDOCAINE-EPINEPHRINE 2 %-1:100000 IJ SOLN
20.0000 mL | Freq: Once | INTRAMUSCULAR | Status: DC
  Filled 2014-12-28: qty 1

## 2014-12-28 NOTE — Discharge Instructions (Signed)
Facial Laceration  A facial laceration is a cut on the face. These injuries can be painful and cause bleeding. Lacerations usually heal quickly, but they need special care to reduce scarring. DIAGNOSIS  Your health care provider will take a medical history, ask for details about how the injury occurred, and examine the wound to determine how deep the cut is. TREATMENT  Some facial lacerations may not require closure. Others may not be able to be closed because of an increased risk of infection. The risk of infection and the chance for successful closure will depend on various factors, including the amount of time since the injury occurred. The wound may be cleaned to help prevent infection. If closure is appropriate, pain medicines may be given if needed. Your health care provider will use stitches (sutures), wound glue (adhesive), or skin adhesive strips to repair the laceration. These tools bring the skin edges together to allow for faster healing and a better cosmetic outcome. If needed, you may also be given a tetanus shot. HOME CARE INSTRUCTIONS  Only take over-the-counter or prescription medicines as directed by your health care provider.  Follow your health care provider's instructions for wound care. These instructions will vary depending on the technique used for closing the wound. For Sutures:  Keep the wound clean and dry.   If you were given a bandage (dressing), you should change it at least once a day. Also change the dressing if it becomes wet or dirty, or as directed by your health care provider.   Wash the wound with soap and water 2 times a day. Rinse the wound off with water to remove all soap. Pat the wound dry with a clean towel.   After cleaning, apply a thin layer of the antibiotic ointment recommended by your health care provider. This will help prevent infection and keep the dressing from sticking.   You may shower as usual after the first 24 hours. Do not soak the  wound in water until the sutures are removed.   Get your sutures removed as directed by your health care provider. With facial lacerations, sutures should usually be taken out after 4-5 days to avoid stitch marks.   Wait a few days after your sutures are removed before applying any makeup. For Skin Adhesive Strips:  Keep the wound clean and dry.   Do not get the skin adhesive strips wet. You may bathe carefully, using caution to keep the wound dry.   If the wound gets wet, pat it dry with a clean towel.   Skin adhesive strips will fall off on their own. You may trim the strips as the wound heals. Do not remove skin adhesive strips that are still stuck to the wound. They will fall off in time.  For Wound Adhesive:  You may briefly wet your wound in the shower or bath. Do not soak or scrub the wound. Do not swim. Avoid periods of heavy sweating until the skin adhesive has fallen off on its own. After showering or bathing, gently pat the wound dry with a clean towel.   Do not apply liquid medicine, cream medicine, ointment medicine, or makeup to your wound while the skin adhesive is in place. This may loosen the film before your wound is healed.   If a dressing is placed over the wound, be careful not to apply tape directly over the skin adhesive. This may cause the adhesive to be pulled off before the wound is healed.   Avoid   prolonged exposure to sunlight or tanning lamps while the skin adhesive is in place.  The skin adhesive will usually remain in place for 5-10 days, then naturally fall off the skin. Do not pick at the adhesive film.  After Healing: Once the wound has healed, cover the wound with sunscreen during the day for 1 full year. This can help minimize scarring. Exposure to ultraviolet light in the first year will darken the scar. It can take 1-2 years for the scar to lose its redness and to heal completely.  SEEK IMMEDIATE MEDICAL CARE IF:  You have redness, pain, or  swelling around the wound.   You see ayellowish-white fluid (pus) coming from the wound.   You have chills or a fever.  MAKE SURE YOU:  Understand these instructions.  Will watch your condition.  Will get help right away if you are not doing well or get worse. Document Released: 01/14/2005 Document Revised: 09/27/2013 Document Reviewed: 07/20/2013 Exit[employer] Patient Information 2015 [employer]. This information is not intended to replace advice given to you by your health care provider. Make sure you discuss any questions you have with your health care provider.  

## 2014-12-28 NOTE — Progress Notes (Signed)
  CARE MANAGEMENT ED NOTE 12/28/2014  Patient:  Savannah Savannah Todd, Savannah Savannah Todd   Account Number:  [id number]  Date Initiated:  12/28/2014  Documentation initiated by:  Livia Snellen  Subjective/Objective Assessment:   Patient presents to Ed post fall with ;aceration to her forehead     Subjective/Objective Assessment Detail:   Patient with pmhx of thyroid disease, HTN, anxiety, memory disorder     Action/Plan:   Laceration repair at bedside   Action/Plan Detail:   Anticipated DC Date:  12/28/2014     Status Recommendation to Physician:   Result of Recommendation:    Other ED Services  Consult Working [place]  CM consult  Other   [facility] Choice  HOME HEALTH   Choice offered to / List presented to:  C-4 Adult Children     [place] arranged  HH-1 RN  [place] agency  [place]    Status of service:  Completed, signed off  ED Comments:   ED Comments Detail:  EDCM spoke to patient and her grandson Savannah Todd at bedside. Patient is confused.  Per Sherren Mocha, patient lives with her caretaker Savannah Savannah Todd where she receives 24/7 care.  [facility] received permission from Sherren Mocha to call patient's caretaker Savannah Savannah Todd at [phone] (phone number supplied by Sherren Mocha). Per Savannah Savannah Todd, patient does receive 24/7 care.  Savannah Savannah Todd is assisted by a Savannah Savannah Todd who comes to see the patient every day, assists with ADL's and meals from 930am to 1 or 2pm.  Then Savannah Savannah Todd comes to the house and stays until the patient goes to bed.  Savannah Savannah Todd also reports Savannah Savannah Todd Savannah Savannah Todd's sister lives at the house as well and "Watches" the patient.  Savannah Savannah Todd reports Savannah Savannah Todd tells her when the patient is starting to get up from the chair.  Savannah Savannah Todd reports the patient was at [facility] in the past where she fell and broke five ribs and then was sent to [facility] but the family was unhappy with the care there. Savannah Savannah Todd reports the patient has a walker, BSC, Wheelchair and shower chair at home.  Savannah Savannah Todd reports  [facility] was a great help to patient and would like for her to have services again.  EDCM spoke to patient's son Savannah Savannah Todd at bedside who is agreeable for patient to receive a visiting RN and PT at home with [facility].  Patient's son does not wish to have the patient placed at this time.  Discussed patient with EDP.  Home health orders placed and faxed to [facility] for RN and PT.  No further EDCM needs at this time.

## 2014-12-28 NOTE — ED Notes (Signed)
Per pt's family member, pt had unwitnessed fall ambulating to bathroom without her walker, pt has been falling frequently lately. Pt has large deep laceration to forehead, 1 cm by 2 cm, minimal bleeding. Per family member, pt is at mental baseline of dementia.

## 2014-12-28 NOTE — ED Provider Notes (Signed)
LACERATION REPAIR Date/Time: 12/28/2014 7:40 PM Performed by: Abigail Butts Authorized by: Abigail Butts Consent: Verbal consent obtained. Risks and benefits: risks, benefits and alternatives were discussed Consent given by: guardian Patient understanding: patient states understanding of the procedure being performed Patient consent: the patient's understanding of the procedure matches consent given Procedure consent: procedure consent matches procedure scheduled Site marked: the operative site was marked Required items: required blood products, implants, devices, and special equipment available Patient identity confirmed: verbally with patient and arm band Time out: Immediately prior to procedure a "time out" was called to verify the correct patient, procedure, equipment, support staff and site/side marked as required. Body area: head/neck Location details: forehead Laceration length: 3 cm Foreign bodies: no foreign bodies Tendon involvement: none Nerve involvement: none Vascular damage: no Anesthesia: local infiltration Local anesthetic: lidocaine 2% with epinephrine Anesthetic total: 3 ml Patient sedated: no Preparation: Patient was prepped and draped in the usual sterile fashion. Irrigation solution: saline Irrigation method: syringe Amount of cleaning: standard Debridement: none Degree of undermining: none Wound skin closure material used: 5-0 Vicryl Rapide. Number of sutures: 5 Technique: simple Approximation: close Approximation difficulty: complex Dressing: 4x4 sterile gauze Patient tolerance: Patient tolerated the procedure well with no immediate complications  Abigail Butts, PA-C 12/28/14 1942  Leota Jacobsen, MD 12/28/14 [phone]

## 2014-12-28 NOTE — ED Provider Notes (Signed)
CSN: [id number]     Arrival date & time 12/28/14  1632 History   First MD Initiated Contact with Patient 12/28/14 1648     Chief Complaint  Patient presents with  . Fall     (Consider location/radiation/quality/duration/timing/severity/associated sxs/prior Treatment) HPI Comments: Patient here after having an unwitnessed fall at the nursing home. Has a laceration to her mid forehead. Unknown loss of consciousness. According to her grandson she is at her baseline at this time. No recent illnesses. Bandage applied prior to arrival and patient transported here.  Patient is a 81 y.o. female presenting with fall. The history is provided by the patient and a relative.  Fall    Past Medical History  Diagnosis Date  . Thyroid disease     hypothyroidism  . Hypertension   . IBS (irritable bowel syndrome)   . Aortic stenosis   . Memory disorder     mild  . Hyperlipidemia   . GERD (gastroesophageal reflux disease)   . Ulcerative colitis   . Arthritis   . Anxiety    Past Surgical History  Procedure Laterality Date  . Esophagogastroduodenoscopy  01/30/1993    with biopsy/second duodenum normal/mucosa normal/no ulcerations or any inflammatory activity/no evidence of extrinsic compression or active inflammation/fundus & cardia normal/2 to 3cm hiatal hernia/proximal esophagus normal  . Esophagogastroduodenoscopy  07/18/1991    ampulla normal/pyloric channel,prepyloric area, antrum & body normal/fundus & cardia normal/distal & proximal esophagus normal/  . Abdominal hysterectomy     Family History  Problem Relation Age of Onset  . Heart disease    . Cancer Mother   . Heart disease Father    History  Substance Use Topics  . Smoking status: Never Smoker   . Smokeless tobacco: Not on file  . Alcohol Use: No   OB History    No data available     Review of Systems  All other systems reviewed and are negative.     Allergies  Niaspan; Bactrim; Benazepril hcl; and Naprosyn  Home  Medications   Prior to Admission medications   Medication Sig Start Date End Date Taking? Authorizing Provider  acetaminophen (TYLENOL) 650 MG CR tablet Take 650 mg by mouth every 8 (eight) hours.    Historical Provider, MD  amLODipine (NORVASC) 5 MG tablet Take 1 tablet (5 mg total) by mouth daily. 10/23/14   Darlyne Russian, MD  CALCIUM CITRATE + D 250-200 MG-UNIT TABS TAKE 2 TABLETS BY MOUTH EVERY DAY 10/22/14   Darlyne Russian, MD  cephALEXin (KEFLEX) 500 MG capsule Take 1 capsule (500 mg total) by mouth 2 (two) times daily. 11/25/14   Darlyne Russian, MD  ergocalciferol (VITAMIN D2) 50000 UNITS capsule Take 50,000 Units by mouth once a week. Every Saturday    Historical Provider, MD  levothyroxine (SYNTHROID, LEVOTHROID) 125 MCG tablet Take 125 mcg by mouth daily before breakfast.    Historical Provider, MD  LORazepam (ATIVAN) 0.5 MG tablet Take one half tablet in the morning as needed for anxiety and one half tablet at bedtime for sleep. 10/25/14   Darlyne Russian, MD  mirtazapine (REMERON SOL-TAB) 15 MG disintegrating tablet Take 1 tablet (15 mg total) by mouth at bedtime. 11/26/14   Darlyne Russian, MD  Misc. Devices (AIRGO ROLLING [place]) MISC Use as directed. 03/28/14   Darlyne Russian, MD  Vitamin D, Ergocalciferol, (DRISDOL) 50000 UNITS CAPS capsule TAKE ONE CAPSULE BY MOUTH EVERY WEEK 12/03/14   Darlyne Russian, MD   Pulse  59  Temp(Src) 98.8 F (37.1 C) (Oral)  Resp 18  SpO2 97% Physical Exam  Constitutional: She is oriented to person, place, and time. She appears well-developed and well-nourished.  Non-toxic appearance. No distress.  HENT:  Head: Normocephalic and atraumatic.    Eyes: Conjunctivae, EOM and lids are normal. Pupils are equal, round, and reactive to light.  Neck: Normal range of motion. Neck supple. No tracheal deviation present. No thyroid mass present.  Cardiovascular: Normal rate, regular rhythm and normal heart sounds.  Exam reveals no gallop.   No murmur  heard. Pulmonary/Chest: Effort normal and breath sounds normal. No stridor. No respiratory distress. She has no decreased breath sounds. She has no wheezes. She has no rhonchi. She has no rales.  Abdominal: Soft. Normal appearance and bowel sounds are normal. She exhibits no distension. There is no tenderness. There is no rebound and no CVA tenderness.  Musculoskeletal: Normal range of motion. She exhibits no edema or tenderness.  Neurological: She is alert and oriented to person, place, and time. She has normal strength. No cranial nerve deficit or sensory deficit. GCS eye subscore is 4. GCS verbal subscore is 5. GCS motor subscore is 6.  Skin: Skin is warm and dry. No abrasion and no rash noted.  Psychiatric: Her speech is normal and behavior is normal. Her affect is blunt.  Nursing note and vitals reviewed.   ED Course  Procedures (including critical care time) Labs Review Labs Reviewed - No data to display  Imaging Review No results found.   EKG Interpretation   Date/Time:  Friday December 28 2014 16:46:41 EST Ventricular Rate:  57 PR Interval:  202 QRS Duration: 89 QT Interval:  446 QTC Calculation: 434 R Axis:   -34 Text Interpretation:  Sinus rhythm Inferior infarct, old Baseline wander  in lead(s) II aVR aVF No significant change since last tracing Confirmed  by Macky Galik  MD, Jane Broughton ([id number]) on 12/28/2014 5:03:51 PM      MDM   Final diagnoses:  Trauma   Spoke with patient's family member who is a medical power of attorney and he does not want the patient have a head CT or any other imaging. Risk of missed intracranial process explained to him and he understands this. We'll have for her laceration closed by APP     Leota Jacobsen, MD 12/28/14 [phone]

## 2014-12-28 NOTE — Progress Notes (Signed)
CSW met with patient at bedside. Yolanda Bonine was present. Patient confirms that she fell today. According to grandson, patient was BIB by son. Grandson informed CSW that patient lives at home in [place] with her care taker. According to grandson, the caretaker is with the patient 24/7. Also, grandson states that the patient often loses her balance which results in her falling.  Grandson states the the patient often receives assistance for the majority of her ADL's. Patient appears to have a small bruise on her which resulted from her fall. Patient also appears to have a small head injury.   Ulah Olmo [phone] Omelia Blackwater [phone] ED CSW 12/28/2014 5:23 PM

## 2014-12-30 DIAGNOSIS — I10 Essential (primary) hypertension: Secondary | ICD-10-CM | POA: Diagnosis not present

## 2014-12-30 DIAGNOSIS — S0180XD Unspecified open wound of other part of head, subsequent encounter: Secondary | ICD-10-CM | POA: Diagnosis not present

## 2014-12-30 DIAGNOSIS — G309 Alzheimer's disease, unspecified: Secondary | ICD-10-CM | POA: Diagnosis not present

## 2014-12-30 DIAGNOSIS — M6281 Muscle weakness (generalized): Secondary | ICD-10-CM | POA: Diagnosis not present

## 2014-12-30 DIAGNOSIS — F0280 Dementia in other diseases classified elsewhere without behavioral disturbance: Secondary | ICD-10-CM | POA: Diagnosis not present

## 2014-12-30 DIAGNOSIS — Z9181 History of falling: Secondary | ICD-10-CM | POA: Diagnosis not present

## 2014-12-30 DIAGNOSIS — F419 Anxiety disorder, unspecified: Secondary | ICD-10-CM | POA: Diagnosis not present

## 2014-12-30 DIAGNOSIS — R262 Difficulty in walking, not elsewhere classified: Secondary | ICD-10-CM | POA: Diagnosis not present

## 2014-12-30 LAB — URINE CULTURE: Colony Count: >=100000

## 2014-12-31 ENCOUNTER — Telehealth (HOSPITAL_BASED_OUTPATIENT_CLINIC_OR_DEPARTMENT_OTHER): Payer: Self-pay | Admitting: *Deleted

## 2014-12-31 NOTE — Telephone Encounter (Signed)
error 

## 2014-12-31 NOTE — Progress Notes (Signed)
ED Antimicrobial Stewardship Positive Culture Follow Up   Savannah Todd is an 81 y.o. female who presented to [facility] on 12/28/2014 with a chief complaint of unwitnessed fall  Chief Complaint  Patient presents with  . Fall    Recent Results (from the past 720 hour(s))  Urine culture     Status: None   Collection Time: 12/28/14  5:41 PM  Result Value Ref Range Status   Specimen Description URINE, CLEAN CATCH  Final   Special Requests NONE  Final   Colony Count   Final    >=100,000 COLONIES/ML Performed at [facility]    Culture   Final    ESCHERICHIA COLI Performed at [facility]    Report Status 12/30/2014 FINAL  Final   Organism ID, Bacteria ESCHERICHIA COLI  Final      Susceptibility   Escherichia coli - MIC*    AMPICILLIN <=2 SENSITIVE Sensitive     CEFAZOLIN <=4 SENSITIVE Sensitive     CEFTRIAXONE <=1 SENSITIVE Sensitive     CIPROFLOXACIN <=0.25 SENSITIVE Sensitive     GENTAMICIN <=1 SENSITIVE Sensitive     LEVOFLOXACIN <=0.12 SENSITIVE Sensitive     NITROFURANTOIN <=16 SENSITIVE Sensitive     TOBRAMYCIN <=1 SENSITIVE Sensitive     TRIMETH/SULFA <=20 SENSITIVE Sensitive     PIP/TAZO <=4 SENSITIVE Sensitive     * ESCHERICHIA COLI    [x]  Patient discharged originally without antimicrobial agent and treatment is now indicated  56 YOF with dementia at baseline who presented on 1/8 after an unwitnessed fall. Per family, patient's mentation was at baseline however the patient did have increased falls lately. UA (+) and UCx grew out pan-sensitive E. Coli - would recommend treating.  New antibiotic prescription: Keflex 500 mg bid x 7 days  ED Provider: Patty Sermons Camprubi Soms, PA-C  Lawson Radar 12/31/2014, 9:19 AM Infectious Diseases Pharmacist Phone# [phone]

## 2015-01-03 DIAGNOSIS — I10 Essential (primary) hypertension: Secondary | ICD-10-CM | POA: Diagnosis not present

## 2015-01-03 DIAGNOSIS — Z9181 History of falling: Secondary | ICD-10-CM | POA: Diagnosis not present

## 2015-01-03 DIAGNOSIS — F0280 Dementia in other diseases classified elsewhere without behavioral disturbance: Secondary | ICD-10-CM | POA: Diagnosis not present

## 2015-01-03 DIAGNOSIS — S0180XD Unspecified open wound of other part of head, subsequent encounter: Secondary | ICD-10-CM | POA: Diagnosis not present

## 2015-01-03 DIAGNOSIS — F419 Anxiety disorder, unspecified: Secondary | ICD-10-CM | POA: Diagnosis not present

## 2015-01-03 DIAGNOSIS — G309 Alzheimer's disease, unspecified: Secondary | ICD-10-CM | POA: Diagnosis not present

## 2015-01-04 ENCOUNTER — Telehealth: Payer: Self-pay

## 2015-01-04 DIAGNOSIS — F419 Anxiety disorder, unspecified: Secondary | ICD-10-CM | POA: Diagnosis not present

## 2015-01-04 DIAGNOSIS — F0280 Dementia in other diseases classified elsewhere without behavioral disturbance: Secondary | ICD-10-CM | POA: Diagnosis not present

## 2015-01-04 DIAGNOSIS — I10 Essential (primary) hypertension: Secondary | ICD-10-CM | POA: Diagnosis not present

## 2015-01-04 DIAGNOSIS — G309 Alzheimer's disease, unspecified: Secondary | ICD-10-CM | POA: Diagnosis not present

## 2015-01-04 DIAGNOSIS — S0180XD Unspecified open wound of other part of head, subsequent encounter: Secondary | ICD-10-CM | POA: Diagnosis not present

## 2015-01-04 DIAGNOSIS — Z9181 History of falling: Secondary | ICD-10-CM | POA: Diagnosis not present

## 2015-01-04 NOTE — Telephone Encounter (Signed)
June called about Savannah Todd. Dr. Everlene Farrier is her doctor.  He is from [facility] and has a plan of care for her. He wants to see her 2x's a week for 4 weeks to increase her strength, balancing, fall prevention, safety and function mobility. If Dr. Everlene Farrier agrees he will continue with this plan of care. Please advise June at [phone].

## 2015-01-04 NOTE — Telephone Encounter (Signed)
Please give okay for patient to have physical therapy to work on balance and strengthening. Call June at [phone]

## 2015-01-04 NOTE — Telephone Encounter (Signed)
Please advise ok to give VO for PT as listed below- pt had a recent fall including a forehead laceration seen at ED.

## 2015-01-06 ENCOUNTER — Other Ambulatory Visit: Payer: Self-pay | Admitting: Physician Assistant

## 2015-01-07 NOTE — Telephone Encounter (Signed)
Spoke to June and gave Order.

## 2015-01-09 DIAGNOSIS — F419 Anxiety disorder, unspecified: Secondary | ICD-10-CM | POA: Diagnosis not present

## 2015-01-09 DIAGNOSIS — F0280 Dementia in other diseases classified elsewhere without behavioral disturbance: Secondary | ICD-10-CM | POA: Diagnosis not present

## 2015-01-09 DIAGNOSIS — G309 Alzheimer's disease, unspecified: Secondary | ICD-10-CM | POA: Diagnosis not present

## 2015-01-09 DIAGNOSIS — S0180XD Unspecified open wound of other part of head, subsequent encounter: Secondary | ICD-10-CM | POA: Diagnosis not present

## 2015-01-09 DIAGNOSIS — Z9181 History of falling: Secondary | ICD-10-CM | POA: Diagnosis not present

## 2015-01-09 DIAGNOSIS — I10 Essential (primary) hypertension: Secondary | ICD-10-CM | POA: Diagnosis not present

## 2015-01-10 ENCOUNTER — Telehealth: Payer: Self-pay

## 2015-01-10 NOTE — Telephone Encounter (Signed)
Dr. Everlene Farrier, [facility] called to let you know that they are discharging her from their care because pt is refusing to let anyone come to her house and check on her

## 2015-01-14 DIAGNOSIS — Z9181 History of falling: Secondary | ICD-10-CM | POA: Diagnosis not present

## 2015-01-14 DIAGNOSIS — F0280 Dementia in other diseases classified elsewhere without behavioral disturbance: Secondary | ICD-10-CM | POA: Diagnosis not present

## 2015-01-14 DIAGNOSIS — S0180XD Unspecified open wound of other part of head, subsequent encounter: Secondary | ICD-10-CM | POA: Diagnosis not present

## 2015-01-14 DIAGNOSIS — F419 Anxiety disorder, unspecified: Secondary | ICD-10-CM | POA: Diagnosis not present

## 2015-01-14 DIAGNOSIS — G309 Alzheimer's disease, unspecified: Secondary | ICD-10-CM | POA: Diagnosis not present

## 2015-01-14 DIAGNOSIS — I10 Essential (primary) hypertension: Secondary | ICD-10-CM | POA: Diagnosis not present

## 2015-01-15 DIAGNOSIS — F419 Anxiety disorder, unspecified: Secondary | ICD-10-CM | POA: Diagnosis not present

## 2015-01-15 DIAGNOSIS — Z9181 History of falling: Secondary | ICD-10-CM | POA: Diagnosis not present

## 2015-01-15 DIAGNOSIS — S0180XD Unspecified open wound of other part of head, subsequent encounter: Secondary | ICD-10-CM | POA: Diagnosis not present

## 2015-01-15 DIAGNOSIS — F0280 Dementia in other diseases classified elsewhere without behavioral disturbance: Secondary | ICD-10-CM | POA: Diagnosis not present

## 2015-01-15 DIAGNOSIS — G309 Alzheimer's disease, unspecified: Secondary | ICD-10-CM | POA: Diagnosis not present

## 2015-01-15 DIAGNOSIS — I10 Essential (primary) hypertension: Secondary | ICD-10-CM | POA: Diagnosis not present

## 2015-01-16 DIAGNOSIS — F419 Anxiety disorder, unspecified: Secondary | ICD-10-CM | POA: Diagnosis not present

## 2015-01-16 DIAGNOSIS — G309 Alzheimer's disease, unspecified: Secondary | ICD-10-CM | POA: Diagnosis not present

## 2015-01-16 DIAGNOSIS — F0280 Dementia in other diseases classified elsewhere without behavioral disturbance: Secondary | ICD-10-CM | POA: Diagnosis not present

## 2015-01-16 DIAGNOSIS — Z9181 History of falling: Secondary | ICD-10-CM | POA: Diagnosis not present

## 2015-01-16 DIAGNOSIS — S0180XD Unspecified open wound of other part of head, subsequent encounter: Secondary | ICD-10-CM | POA: Diagnosis not present

## 2015-01-16 DIAGNOSIS — I10 Essential (primary) hypertension: Secondary | ICD-10-CM | POA: Diagnosis not present

## 2015-01-22 DIAGNOSIS — F0280 Dementia in other diseases classified elsewhere without behavioral disturbance: Secondary | ICD-10-CM | POA: Diagnosis not present

## 2015-01-22 DIAGNOSIS — Z9181 History of falling: Secondary | ICD-10-CM | POA: Diagnosis not present

## 2015-01-22 DIAGNOSIS — S0180XD Unspecified open wound of other part of head, subsequent encounter: Secondary | ICD-10-CM | POA: Diagnosis not present

## 2015-01-22 DIAGNOSIS — G309 Alzheimer's disease, unspecified: Secondary | ICD-10-CM | POA: Diagnosis not present

## 2015-01-22 DIAGNOSIS — I10 Essential (primary) hypertension: Secondary | ICD-10-CM | POA: Diagnosis not present

## 2015-01-22 DIAGNOSIS — F419 Anxiety disorder, unspecified: Secondary | ICD-10-CM | POA: Diagnosis not present

## 2015-01-25 DIAGNOSIS — S0180XD Unspecified open wound of other part of head, subsequent encounter: Secondary | ICD-10-CM | POA: Diagnosis not present

## 2015-01-25 DIAGNOSIS — F0280 Dementia in other diseases classified elsewhere without behavioral disturbance: Secondary | ICD-10-CM | POA: Diagnosis not present

## 2015-01-25 DIAGNOSIS — I10 Essential (primary) hypertension: Secondary | ICD-10-CM | POA: Diagnosis not present

## 2015-01-25 DIAGNOSIS — Z9181 History of falling: Secondary | ICD-10-CM | POA: Diagnosis not present

## 2015-01-25 DIAGNOSIS — F419 Anxiety disorder, unspecified: Secondary | ICD-10-CM | POA: Diagnosis not present

## 2015-01-25 DIAGNOSIS — G309 Alzheimer's disease, unspecified: Secondary | ICD-10-CM | POA: Diagnosis not present

## 2015-01-29 DIAGNOSIS — G309 Alzheimer's disease, unspecified: Secondary | ICD-10-CM | POA: Diagnosis not present

## 2015-01-29 DIAGNOSIS — Z9181 History of falling: Secondary | ICD-10-CM | POA: Diagnosis not present

## 2015-01-29 DIAGNOSIS — F0280 Dementia in other diseases classified elsewhere without behavioral disturbance: Secondary | ICD-10-CM | POA: Diagnosis not present

## 2015-01-29 DIAGNOSIS — F419 Anxiety disorder, unspecified: Secondary | ICD-10-CM | POA: Diagnosis not present

## 2015-01-29 DIAGNOSIS — S0180XD Unspecified open wound of other part of head, subsequent encounter: Secondary | ICD-10-CM | POA: Diagnosis not present

## 2015-01-29 DIAGNOSIS — I10 Essential (primary) hypertension: Secondary | ICD-10-CM | POA: Diagnosis not present

## 2015-02-01 DIAGNOSIS — I10 Essential (primary) hypertension: Secondary | ICD-10-CM | POA: Diagnosis not present

## 2015-02-01 DIAGNOSIS — S0180XD Unspecified open wound of other part of head, subsequent encounter: Secondary | ICD-10-CM | POA: Diagnosis not present

## 2015-02-01 DIAGNOSIS — Z9181 History of falling: Secondary | ICD-10-CM | POA: Diagnosis not present

## 2015-02-01 DIAGNOSIS — F419 Anxiety disorder, unspecified: Secondary | ICD-10-CM | POA: Diagnosis not present

## 2015-02-01 DIAGNOSIS — F0280 Dementia in other diseases classified elsewhere without behavioral disturbance: Secondary | ICD-10-CM | POA: Diagnosis not present

## 2015-02-01 DIAGNOSIS — G309 Alzheimer's disease, unspecified: Secondary | ICD-10-CM | POA: Diagnosis not present

## 2015-02-08 ENCOUNTER — Other Ambulatory Visit: Payer: Self-pay | Admitting: Physician Assistant

## 2015-02-08 ENCOUNTER — Other Ambulatory Visit: Payer: Self-pay | Admitting: Emergency Medicine

## 2015-02-11 ENCOUNTER — Other Ambulatory Visit: Payer: Self-pay | Admitting: Emergency Medicine

## 2015-02-11 DIAGNOSIS — F0391 Unspecified dementia with behavioral disturbance: Secondary | ICD-10-CM

## 2015-02-11 DIAGNOSIS — F03918 Unspecified dementia, unspecified severity, with other behavioral disturbance: Secondary | ICD-10-CM

## 2015-02-20 ENCOUNTER — Ambulatory Visit (INDEPENDENT_AMBULATORY_CARE_PROVIDER_SITE_OTHER): Payer: Medicare Other | Admitting: Neurology

## 2015-02-20 ENCOUNTER — Encounter: Payer: Self-pay | Admitting: Neurology

## 2015-02-20 VITALS — BP 118/68 | HR 58 | Temp 98.3°F | Resp 16 | Ht 61.2 in | Wt 125.5 lb

## 2015-02-20 DIAGNOSIS — G309 Alzheimer's disease, unspecified: Secondary | ICD-10-CM

## 2015-02-20 DIAGNOSIS — F0280 Dementia in other diseases classified elsewhere without behavioral disturbance: Secondary | ICD-10-CM | POA: Diagnosis not present

## 2015-02-20 MED ORDER — DONEPEZIL HCL 10 MG PO TABS: 10.0000 mg | ORAL_TABLET | Freq: Every day | ORAL | Status: AC

## 2015-02-20 NOTE — Progress Notes (Signed)
NEUROLOGY CONSULTATION NOTE  VIANKA ERTEL MRN: [id number] DOB: 1920-10-08  Referring provider: Dr. Everlene Farrier Primary care provider: Dr. Everlene Farrier  Reason for consult:  dementia  HISTORY OF PRESENT ILLNESS: Savannah Todd is a 81 year old right-handed woman with dementia, hypertension and hypothyroidism who presents for dementia.  Records, labs and CT and MRI of head reviewed.  She is accompanied by her son who provides the majority of the history as patient is a poor historian.  For 2 years, she has had a gradual decline in memory.  At first, she had trouble recalling people's names.  Now, she will forget things from just a few minutes before.  She won't remember conversations or people she just saw.  Long-term memory fluctuates.  Sometimes she thinks that long-deceased relatives are still alive, such as her brother or mother, and that she recently had a conversation with them.  She does not exhibit any visual hallucinations, however.  She is not as vocal as she used to be.  She is not belligerent or combative.  Sometimes she is a little agitated.  She will sleep well some times while other nights she will repeatedly get out of bed with the intent to go out of the house.   She used to reside in a long-term care facility.  Now she lives in the private home of a woman who cares for her.  She requires assistance with ADLs, such as bathing, going to the bathroom and dressing, but mostly due to physical limitations rather than apraxia.  She naps often during the day. She ambulates with a walker and has had falls.  She takes Aricept 5mg  daily and Remeron 15mg  at bedtime.    There is no known family history of dementia.  MMSE from March 2015 was 17/30 Labs from October include B12 447 and TSH 7.945. CT of head from October 2015 showed diffuse atrophy and chronic small vessel ischemic changes.  This is also seen on prior MRI of brain from October 2014.  PAST MEDICAL HISTORY: Past Medical History  Diagnosis  Date  . Thyroid disease     hypothyroidism  . Hypertension   . IBS (irritable bowel syndrome)   . Aortic stenosis   . Memory disorder     mild  . Hyperlipidemia   . GERD (gastroesophageal reflux disease)   . Ulcerative colitis   . Arthritis   . Anxiety   . Dementia     PAST SURGICAL HISTORY: Past Surgical History  Procedure Laterality Date  . Esophagogastroduodenoscopy  01/30/1993    with biopsy/second duodenum normal/mucosa normal/no ulcerations or any inflammatory activity/no evidence of extrinsic compression or active inflammation/fundus & cardia normal/2 to 3cm hiatal hernia/proximal esophagus normal  . Esophagogastroduodenoscopy  07/18/1991    ampulla normal/pyloric channel,prepyloric area, antrum & body normal/fundus & cardia normal/distal & proximal esophagus normal/  . Abdominal hysterectomy      MEDICATIONS: Current Outpatient Prescriptions on File Prior to Visit  Medication Sig Dispense Refill  . acetaminophen (TYLENOL) 650 MG CR tablet Take 650 mg by mouth every 8 (eight) hours.    Marland Kitchen amLODipine (NORVASC) 5 MG tablet Take 1 tablet (5 mg total) by mouth daily. 90 tablet 3  . CALCIUM CITRATE + D 250-200 MG-UNIT TABS TAKE 2 TABLETS BY MOUTH EVERY DAY 200 each 1  . levothyroxine (SYNTHROID, LEVOTHROID) 125 MCG tablet TAKE 1 TABLET BY MOUTH EVERY MORNING BEFORE BREAKFAST.  "OV NEEDED FOR ADDITIONAL REFILLS" 2ND 30 tablet 0  . mirtazapine (REMERON SOL-TAB)  15 MG disintegrating tablet Take 1 tablet (15 mg total) by mouth at bedtime. 30 tablet 2  . Vitamin D, Ergocalciferol, (DRISDOL) 50000 UNITS CAPS capsule TAKE ONE CAPSULE BY MOUTH EVERY WEEK (Patient taking differently: TAKE ONE CAPSULE BY MOUTH EVERY WEEK on saturday) 12 capsule 0  . cephALEXin (KEFLEX) 500 MG capsule Take 1 capsule (500 mg total) by mouth 2 (two) times daily. (Patient not taking: Reported on 02/20/2015) 14 capsule 0  . levothyroxine (SYNTHROID, LEVOTHROID) 125 MCG tablet Take 125 mcg by mouth daily before  breakfast.    . LORazepam (ATIVAN) 0.5 MG tablet Take one half tablet in the morning as needed for anxiety and one half tablet at bedtime for sleep. (Patient not taking: Reported on 02/20/2015) 30 tablet 3  . Misc. Devices (AIRGO ROLLING [place]) MISC Use as directed. 1 each 0   No current facility-administered medications on file prior to visit.    ALLERGIES: Allergies  Allergen Reactions  . Niaspan [Niacin Er] Itching  . Bactrim Nausea Only  . Benazepril Hcl Cough  . Naprosyn [Naproxen] Itching    FAMILY HISTORY: Family History  Problem Relation Age of Onset  . Heart disease    . Cancer Mother   . Heart disease Father   . Hypertension Son   . Heart failure Maternal Grandfather     SOCIAL HISTORY: History   Social History  . Marital Status: Married    Spouse Name: N/A  . Number of Children: 3  . Years of Education: N/A   Occupational History  . Not on file.   Social History Main Topics  . Smoking status: Former Research scientist (life sciences)  . Smokeless tobacco: Never Used  . Alcohol Use: No  . Drug Use: No  . Sexual Activity: No   Other Topics Concern  . Not on file   Social History Narrative   Widow.    3 adult children.    Son:  [place], [place] [phone]   Glade Stanford [phone]    REVIEW OF SYSTEMS: Constitutional: No fevers, chills, or sweats, no generalized fatigue, change in appetite Eyes: No visual changes, double vision, eye pain Ear, nose and throat: No hearing loss, ear pain, nasal congestion, sore throat Cardiovascular: No chest pain, palpitations Respiratory:  No shortness of breath at rest or with exertion, wheezes GastrointestinaI: No nausea, vomiting, diarrhea, abdominal pain, fecal incontinence Genitourinary:  No dysuria, urinary retention or frequency Musculoskeletal:  No neck pain, back pain Integumentary: No rash, pruritus, skin lesions Neurological: as above Psychiatric: sometimes insomnia Endocrine: No palpitations, fatigue,  diaphoresis, mood swings, change in appetite, change in weight, increased thirst Hematologic/Lymphatic:  No anemia, purpura, petechiae. Allergic/Immunologic: no itchy/runny eyes, nasal congestion, recent allergic reactions, rashes  PHYSICAL EXAM: Filed Vitals:   02/20/15 0832  BP: 118/68  Pulse: 58  Temp: 98.3 F (36.8 C)  Resp: 16   General: No acute distress Head:  Normocephalic.  Laceration on forehead Eyes:  fundi unremarkable, without vessel changes, exudates, hemorrhages or papilledema. Neck: supple, no paraspinal tenderness, full range of motion Back: No paraspinal tenderness Heart: regular rate and rhythm Lungs: Clear to auscultation bilaterally. Vascular: No carotid bruits. Neurological Exam: Mental status: alert and oriented to mostly self, recent memory poor, remote memory intact, fund of knowledge somewhat impaired, attention and concentration impaired, speech fluent and not dysarthric, language intact. MMSE - Mini Mental State Exam 02/20/2015 02/20/2014 08/04/2013  Orientation to time 1 2 4   Orientation to Place 1 3 3  Registration 3 1 3   Attention/ Calculation 0 2 3  Recall 1 1 2   Language- name 2 objects 2 2 2   Language- repeat 1 0 1  Language- follow 3 step command 3 3 3   Language- read & follow direction 1 1 1   Write a sentence 0 1 1  Copy design 0 1 0  Total score 13 17 23    Cranial nerves: CN I: not tested CN II: pupils equal, round and reactive to light, visual fields intact, fundi unremarkable, without vessel changes, exudates, hemorrhages or papilledema. CN III, IV, VI:  full range of motion, no nystagmus, no ptosis CN V: facial sensation intact CN VII: upper and lower face symmetric CN VIII: hearing intact CN IX, X: gag intact, uvula midline CN XI: sternocleidomastoid and trapezius muscles intact CN XII: tongue midline Bulk & Tone: normal, no fasciculations. Motor:  5/5 throughout Sensation:  Reduced vibration sensation in feet.  Pinprick sensation  intact. Deep Tendon Reflexes:  1+ throughout except absent in ankles.  Toes downgoing. Finger to nose testing:  No dysmetria Gait:  Able to stand from sitting walker using arms.  Cannot ambulate without holding on to walker or other person.  Unsteady.  IMPRESSION: Alzheimer's dementia  PLAN: 1.  Will increase Aricept to 10mg  daily.  If tolerating, then in 4 weeks will initiate Namenda 2.  Continue Remeron at bedtime  3.  Walker at all times 4.  24 hour care 5.  Follow up in 6 months.  Thank you for allowing me to take part in the care of this patient.  Metta Clines, DO  CC: Arlyss Queen, MD

## 2015-02-20 NOTE — Patient Instructions (Signed)
1.  We will increase Aricept to 10mg  at bedtime.   2.  In 4 weeks, call with an update.  If she is tolerating the higher dose, we will add another medication called Namenda. 3.  Continue Remeron 4.  Follow up in 6 months

## 2015-03-19 ENCOUNTER — Other Ambulatory Visit: Payer: Self-pay | Admitting: Physician Assistant

## 2015-03-21 ENCOUNTER — Other Ambulatory Visit: Payer: Self-pay | Admitting: Physician Assistant

## 2015-03-21 ENCOUNTER — Telehealth: Payer: Self-pay | Admitting: Neurology

## 2015-03-21 ENCOUNTER — Other Ambulatory Visit: Payer: Self-pay | Admitting: Emergency Medicine

## 2015-03-21 ENCOUNTER — Telehealth: Payer: Self-pay | Admitting: *Deleted

## 2015-03-21 NOTE — Telephone Encounter (Signed)
Patients son Dr Tamala Julian states patient is doing fine with Aricept and would like to move forward with Namenda please advise .

## 2015-03-21 NOTE — Telephone Encounter (Signed)
Patrick Jupiter, pt's son called/returning your call at 11:26AM. C/B [phone]

## 2015-03-21 NOTE — Telephone Encounter (Signed)
Patient's son will pick up Titration kit of Namenda XR at the front desk I instructed them to call o the last week to let us know how she was doing so we could call in refills of the 28 mg if tolerated

## 2015-03-21 NOTE — Telephone Encounter (Signed)
Patient was seen over a month ago and was giving one out of two medication per Dr. Tomi Likens patients son does not feel it is strong enough and would like to know what's next please advise  Call back number [phone]

## 2015-03-21 NOTE — Telephone Encounter (Signed)
I accidentally deleted the message from earlier when the pt's son Initially called. Patrick Jupiter, pt's son called/returning your call at 11:26AM. C/B [phone]

## 2015-03-21 NOTE — Telephone Encounter (Signed)
We will start her on Namenda XR.  Let's provide her a titration kit to goal of 26m daily

## 2015-03-22 NOTE — Telephone Encounter (Signed)
Called in.

## 2015-03-27 ENCOUNTER — Other Ambulatory Visit: Payer: Self-pay | Admitting: *Deleted

## 2015-03-27 MED ORDER — DONEPEZIL HCL 10 MG PO TABS: 10.0000 mg | ORAL_TABLET | Freq: Every day | ORAL | Status: AC

## 2015-03-27 NOTE — Telephone Encounter (Signed)
Please review, is encounter complete and ready to be closed / Sherri S.

## 2015-03-27 NOTE — Telephone Encounter (Signed)
Completed.

## 2015-03-29 ENCOUNTER — Ambulatory Visit: Payer: Medicare Other | Admitting: Neurology

## 2015-04-19 ENCOUNTER — Other Ambulatory Visit: Payer: Self-pay | Admitting: *Deleted

## 2015-04-19 MED ORDER — MEMANTINE HCL ER 28 MG PO CP24: 28.0000 mg | ORAL_CAPSULE | Freq: Every day | ORAL | Status: AC

## 2015-04-23 ENCOUNTER — Other Ambulatory Visit: Payer: Self-pay | Admitting: Family Medicine

## 2015-05-03 ENCOUNTER — Other Ambulatory Visit: Payer: Self-pay | Admitting: Emergency Medicine

## 2015-05-04 NOTE — Telephone Encounter (Signed)
Rx faxed

## 2015-05-06 ENCOUNTER — Other Ambulatory Visit: Payer: Self-pay | Admitting: Emergency Medicine

## 2015-05-07 NOTE — Telephone Encounter (Signed)
Advise on this refill 

## 2015-05-16 ENCOUNTER — Ambulatory Visit (INDEPENDENT_AMBULATORY_CARE_PROVIDER_SITE_OTHER): Payer: Medicare Other | Admitting: Family Medicine

## 2015-05-16 ENCOUNTER — Encounter: Payer: Self-pay | Admitting: Family Medicine

## 2015-05-16 VITALS — BP 135/73 | HR 66 | Temp 99.0°F | Resp 16

## 2015-05-16 DIAGNOSIS — R2681 Unsteadiness on feet: Secondary | ICD-10-CM | POA: Diagnosis not present

## 2015-05-16 DIAGNOSIS — N390 Urinary tract infection, site not specified: Secondary | ICD-10-CM

## 2015-05-16 DIAGNOSIS — I5042 Chronic combined systolic (congestive) and diastolic (congestive) heart failure: Secondary | ICD-10-CM | POA: Diagnosis not present

## 2015-05-16 DIAGNOSIS — C44311 Basal cell carcinoma of skin of nose: Secondary | ICD-10-CM

## 2015-05-16 MED ORDER — CIPROFLOXACIN HCL 250 MG PO TABS: 250.0000 mg | ORAL_TABLET | Freq: Two times a day (BID) | ORAL | Status: AC

## 2015-05-16 MED ORDER — FUROSEMIDE 20 MG PO TABS: 20.0000 mg | ORAL_TABLET | Freq: Every day | ORAL | Status: AC

## 2015-05-16 NOTE — Progress Notes (Signed)
° °  Subjective:    Patient ID: Savannah Todd, female    DOB: May 21, 1920, 81 y.o.   MRN: [id number] This chart was scribed for Robyn Haber, MD by Zola Button, Medical Scribe. This patient was seen in Room 26 and the patient's care was started at 2:44 PM.   HPI HPI Comments: Savannah Todd is a 81 y.o. female with a hx of aortic stenosis and dementia who presents to the [facility] complaining of intermittent, increased leg/foot swelling as of 1 week ago. Patient also has been having some wheezing and urinary incontinence. She denies chest pain, SOB, and leg pain. She lives in a residential institution.  She is here today with her son.  Patient told by Dr. Denna Haggard that the basal cell dx'd on right side of nose was slow growing and not likely to influence morbidity or mortality  Review of Systems  Respiratory: Positive for wheezing. Negative for shortness of breath.   Cardiovascular: Positive for leg swelling. Negative for chest pain.  Genitourinary: Positive for enuresis.       Objective:   Physical Exam CONSTITUTIONAL: Well developed/well nourished HEAD: Normocephalic/atraumatic EYES: EOM/PERRL ENMT: Mucous membranes moist NECK: supple no meningeal signs, right sided carotid bruit SPINE: entire spine nontender CV: Loud systolic murmur best heard at the right sternal border. LUNGS: Lungs are clear to auscultation bilaterally, no apparent distress ABDOMEN: soft, nontender, no rebound or guarding GU: no cva tenderness NEURO: Pt is awake/alert, moves all extremitiesx4, very quiet.  Rarely answers questions (son does all of the talking) EXTREMITIES: pulses normal, full ROM SKIN: warm, color normal, basal cell nodular ca on right nose PSYCH: no abnormalities of mood noted     Assessment & Plan:   This chart was scribed in my presence and reviewed by me personally.    ICD-9-CM ICD-10-CM   1. Chronic combined systolic and diastolic congestive heart failure 428.42  I50.42 furosemide (LASIX) 20 MG tablet   428.0    2. Unsteady gait 781.2 R26.81 Ambulatory referral to Physical Therapy  3. Urinary tract infection without hematuria, site unspecified 599.0 N39.0 ciprofloxacin (CIPRO) 250 MG tablet  4. Basal cell carcinoma of nose 173.31 C44.311      Signed, Robyn Haber, MD

## 2015-05-24 ENCOUNTER — Telehealth: Payer: Self-pay

## 2015-05-24 NOTE — Telephone Encounter (Signed)
Hospice called and asked for OV notes from last OV. Faxed them to [facility].

## 2015-05-24 NOTE — Telephone Encounter (Signed)
Called and spoke with hospice. I told them to go ahead with the referral and they can call me anytime on my cell phone

## 2015-05-24 NOTE — Telephone Encounter (Signed)
Yvette from [facility] [place], called and advised that pt's son called them asking for a Hospice consult/eval. Son reported that pt is not eating and very lethargic. Hospice would need VO for consult and to ask if our Dr would agree to be ordering physician for care. Also, would like an order for Hospice doctors to handle symptom management orders if our MD agrees. Dr Everlene Farrier, you are pt's PCP but you haven't seen pt lately. I didn't know whether to send this to you (as Hospice originally asked) or if I should send to Dr L who just saw pt?

## 2015-05-25 DIAGNOSIS — G309 Alzheimer's disease, unspecified: Secondary | ICD-10-CM | POA: Diagnosis not present

## 2015-05-25 DIAGNOSIS — R630 Anorexia: Secondary | ICD-10-CM | POA: Diagnosis not present

## 2015-05-25 DIAGNOSIS — N390 Urinary tract infection, site not specified: Secondary | ICD-10-CM | POA: Diagnosis not present

## 2015-05-25 DIAGNOSIS — N039 Chronic nephritic syndrome with unspecified morphologic changes: Secondary | ICD-10-CM | POA: Diagnosis not present

## 2015-05-25 DIAGNOSIS — I509 Heart failure, unspecified: Secondary | ICD-10-CM | POA: Diagnosis not present

## 2015-05-27 DIAGNOSIS — R630 Anorexia: Secondary | ICD-10-CM | POA: Diagnosis not present

## 2015-05-27 DIAGNOSIS — N039 Chronic nephritic syndrome with unspecified morphologic changes: Secondary | ICD-10-CM | POA: Diagnosis not present

## 2015-05-27 DIAGNOSIS — I509 Heart failure, unspecified: Secondary | ICD-10-CM | POA: Diagnosis not present

## 2015-05-27 DIAGNOSIS — N390 Urinary tract infection, site not specified: Secondary | ICD-10-CM | POA: Diagnosis not present

## 2015-05-27 DIAGNOSIS — G309 Alzheimer's disease, unspecified: Secondary | ICD-10-CM | POA: Diagnosis not present

## 2015-05-28 DIAGNOSIS — R630 Anorexia: Secondary | ICD-10-CM | POA: Diagnosis not present

## 2015-05-28 DIAGNOSIS — N039 Chronic nephritic syndrome with unspecified morphologic changes: Secondary | ICD-10-CM | POA: Diagnosis not present

## 2015-05-28 DIAGNOSIS — G309 Alzheimer's disease, unspecified: Secondary | ICD-10-CM | POA: Diagnosis not present

## 2015-05-28 DIAGNOSIS — N390 Urinary tract infection, site not specified: Secondary | ICD-10-CM | POA: Diagnosis not present

## 2015-05-28 DIAGNOSIS — I509 Heart failure, unspecified: Secondary | ICD-10-CM | POA: Diagnosis not present

## 2015-05-29 ENCOUNTER — Telehealth: Payer: Self-pay

## 2015-05-29 DIAGNOSIS — R630 Anorexia: Secondary | ICD-10-CM | POA: Diagnosis not present

## 2015-05-29 DIAGNOSIS — I509 Heart failure, unspecified: Secondary | ICD-10-CM | POA: Diagnosis not present

## 2015-05-29 DIAGNOSIS — N039 Chronic nephritic syndrome with unspecified morphologic changes: Secondary | ICD-10-CM | POA: Diagnosis not present

## 2015-05-29 DIAGNOSIS — G309 Alzheimer's disease, unspecified: Secondary | ICD-10-CM | POA: Diagnosis not present

## 2015-05-29 DIAGNOSIS — N390 Urinary tract infection, site not specified: Secondary | ICD-10-CM | POA: Diagnosis not present

## 2015-05-29 NOTE — Telephone Encounter (Signed)
fyi dr Everlene Farrier.

## 2015-05-29 NOTE — Telephone Encounter (Addendum)
Maura from Hospice would like the Dr to know that pt's family is cutting down on her meds which was her Vitamin D and Calcium, also would like to know if Dr Everlene Farrier would like to stop her Synthroid medication. Please call [phone]

## 2015-05-29 NOTE — Telephone Encounter (Signed)
I think she should continue on her thyroid medicine

## 2015-05-30 NOTE — Telephone Encounter (Signed)
Spoke with Cristela Blue and advised message from Dr. Everlene Farrier.

## 2015-05-31 ENCOUNTER — Other Ambulatory Visit: Payer: Self-pay | Admitting: Emergency Medicine

## 2015-05-31 DIAGNOSIS — R630 Anorexia: Secondary | ICD-10-CM | POA: Diagnosis not present

## 2015-05-31 DIAGNOSIS — G309 Alzheimer's disease, unspecified: Secondary | ICD-10-CM | POA: Diagnosis not present

## 2015-05-31 DIAGNOSIS — N390 Urinary tract infection, site not specified: Secondary | ICD-10-CM | POA: Diagnosis not present

## 2015-05-31 DIAGNOSIS — N039 Chronic nephritic syndrome with unspecified morphologic changes: Secondary | ICD-10-CM | POA: Diagnosis not present

## 2015-05-31 DIAGNOSIS — I509 Heart failure, unspecified: Secondary | ICD-10-CM | POA: Diagnosis not present

## 2015-06-03 ENCOUNTER — Telehealth: Payer: Self-pay

## 2015-06-03 NOTE — Telephone Encounter (Signed)
Lorazepam faxed in.

## 2015-06-04 DIAGNOSIS — R630 Anorexia: Secondary | ICD-10-CM | POA: Diagnosis not present

## 2015-06-04 DIAGNOSIS — G309 Alzheimer's disease, unspecified: Secondary | ICD-10-CM | POA: Diagnosis not present

## 2015-06-04 DIAGNOSIS — I509 Heart failure, unspecified: Secondary | ICD-10-CM | POA: Diagnosis not present

## 2015-06-04 DIAGNOSIS — N039 Chronic nephritic syndrome with unspecified morphologic changes: Secondary | ICD-10-CM | POA: Diagnosis not present

## 2015-06-04 DIAGNOSIS — N390 Urinary tract infection, site not specified: Secondary | ICD-10-CM | POA: Diagnosis not present

## 2015-06-06 ENCOUNTER — Other Ambulatory Visit: Payer: Self-pay | Admitting: Emergency Medicine

## 2015-06-06 DIAGNOSIS — G309 Alzheimer's disease, unspecified: Secondary | ICD-10-CM | POA: Diagnosis not present

## 2015-06-06 DIAGNOSIS — I509 Heart failure, unspecified: Secondary | ICD-10-CM | POA: Diagnosis not present

## 2015-06-06 DIAGNOSIS — N039 Chronic nephritic syndrome with unspecified morphologic changes: Secondary | ICD-10-CM | POA: Diagnosis not present

## 2015-06-06 DIAGNOSIS — N390 Urinary tract infection, site not specified: Secondary | ICD-10-CM | POA: Diagnosis not present

## 2015-06-06 DIAGNOSIS — R630 Anorexia: Secondary | ICD-10-CM | POA: Diagnosis not present

## 2015-06-07 ENCOUNTER — Other Ambulatory Visit: Payer: Self-pay | Admitting: *Deleted

## 2015-06-07 DIAGNOSIS — N039 Chronic nephritic syndrome with unspecified morphologic changes: Secondary | ICD-10-CM | POA: Diagnosis not present

## 2015-06-07 DIAGNOSIS — G309 Alzheimer's disease, unspecified: Secondary | ICD-10-CM | POA: Diagnosis not present

## 2015-06-07 DIAGNOSIS — N390 Urinary tract infection, site not specified: Secondary | ICD-10-CM | POA: Diagnosis not present

## 2015-06-07 DIAGNOSIS — I509 Heart failure, unspecified: Secondary | ICD-10-CM | POA: Diagnosis not present

## 2015-06-07 DIAGNOSIS — R630 Anorexia: Secondary | ICD-10-CM | POA: Diagnosis not present

## 2015-06-07 NOTE — Telephone Encounter (Signed)
This is fine with me. Just change the order to 1 mg half tablet twice a day

## 2015-06-07 NOTE — Telephone Encounter (Signed)
Mickel Baas from [facility] calling to get ativan order changed from .5 mg to 1 half tab BID she is taking more at night. She can be reached at [phone]

## 2015-06-07 NOTE — Telephone Encounter (Signed)
Called the number below and this phone is out of service.

## 2015-06-10 NOTE — Telephone Encounter (Signed)
Called the number again and still disconnected.

## 2015-06-11 DIAGNOSIS — G309 Alzheimer's disease, unspecified: Secondary | ICD-10-CM | POA: Diagnosis not present

## 2015-06-11 DIAGNOSIS — R630 Anorexia: Secondary | ICD-10-CM | POA: Diagnosis not present

## 2015-06-11 DIAGNOSIS — I509 Heart failure, unspecified: Secondary | ICD-10-CM | POA: Diagnosis not present

## 2015-06-11 DIAGNOSIS — N390 Urinary tract infection, site not specified: Secondary | ICD-10-CM | POA: Diagnosis not present

## 2015-06-11 DIAGNOSIS — N039 Chronic nephritic syndrome with unspecified morphologic changes: Secondary | ICD-10-CM | POA: Diagnosis not present

## 2015-06-11 MED ORDER — LORAZEPAM 0.5 MG PO TABS: 0.2500 mg | ORAL_TABLET | Freq: Two times a day (BID) | ORAL | Status: DC

## 2015-06-11 NOTE — Telephone Encounter (Signed)
I called [facility] [place] # and advised we can not reach Mickel Baas. They clarified that the RN name is Cristela Blue and her phone number is [phone]. Called and LMOM to CB.

## 2015-06-11 NOTE — Addendum Note (Signed)
Addended by: Elwyn Reach A on: 06/11/2015 10:50 AM   Modules accepted: Orders

## 2015-06-11 NOTE — Telephone Encounter (Signed)
Maura CB and advised that pt is usually fine w/ 0.25 mg during the day, but needs the whole 0.5 tablet at night. She still has some left but will run out sooner w/increased dose Qhs. I have pended a new Rx for the 0.5 mg tablets, 1/2 - 1 tablet BID since this seems to be the way it is working for pt. I put message to hold until needed. If OK, please sign/print. Thank you, Dr Everlene Farrier.

## 2015-06-14 DIAGNOSIS — R630 Anorexia: Secondary | ICD-10-CM | POA: Diagnosis not present

## 2015-06-14 DIAGNOSIS — N039 Chronic nephritic syndrome with unspecified morphologic changes: Secondary | ICD-10-CM | POA: Diagnosis not present

## 2015-06-14 DIAGNOSIS — G309 Alzheimer's disease, unspecified: Secondary | ICD-10-CM | POA: Diagnosis not present

## 2015-06-14 DIAGNOSIS — I509 Heart failure, unspecified: Secondary | ICD-10-CM | POA: Diagnosis not present

## 2015-06-14 DIAGNOSIS — N390 Urinary tract infection, site not specified: Secondary | ICD-10-CM | POA: Diagnosis not present

## 2015-06-18 DIAGNOSIS — N390 Urinary tract infection, site not specified: Secondary | ICD-10-CM | POA: Diagnosis not present

## 2015-06-18 DIAGNOSIS — G309 Alzheimer's disease, unspecified: Secondary | ICD-10-CM | POA: Diagnosis not present

## 2015-06-18 DIAGNOSIS — N039 Chronic nephritic syndrome with unspecified morphologic changes: Secondary | ICD-10-CM | POA: Diagnosis not present

## 2015-06-18 DIAGNOSIS — I509 Heart failure, unspecified: Secondary | ICD-10-CM | POA: Diagnosis not present

## 2015-06-18 DIAGNOSIS — R630 Anorexia: Secondary | ICD-10-CM | POA: Diagnosis not present

## 2015-06-19 DIAGNOSIS — I509 Heart failure, unspecified: Secondary | ICD-10-CM | POA: Diagnosis not present

## 2015-06-19 DIAGNOSIS — N390 Urinary tract infection, site not specified: Secondary | ICD-10-CM | POA: Diagnosis not present

## 2015-06-19 DIAGNOSIS — G309 Alzheimer's disease, unspecified: Secondary | ICD-10-CM | POA: Diagnosis not present

## 2015-06-19 DIAGNOSIS — R630 Anorexia: Secondary | ICD-10-CM | POA: Diagnosis not present

## 2015-06-19 DIAGNOSIS — N039 Chronic nephritic syndrome with unspecified morphologic changes: Secondary | ICD-10-CM | POA: Diagnosis not present

## 2015-06-21 DIAGNOSIS — R630 Anorexia: Secondary | ICD-10-CM | POA: Diagnosis not present

## 2015-06-21 DIAGNOSIS — G309 Alzheimer's disease, unspecified: Secondary | ICD-10-CM | POA: Diagnosis not present

## 2015-06-21 DIAGNOSIS — N390 Urinary tract infection, site not specified: Secondary | ICD-10-CM | POA: Diagnosis not present

## 2015-06-21 DIAGNOSIS — N039 Chronic nephritic syndrome with unspecified morphologic changes: Secondary | ICD-10-CM | POA: Diagnosis not present

## 2015-06-21 DIAGNOSIS — I509 Heart failure, unspecified: Secondary | ICD-10-CM | POA: Diagnosis not present

## 2015-06-29 ENCOUNTER — Other Ambulatory Visit: Payer: Self-pay | Admitting: Emergency Medicine

## 2015-07-09 ENCOUNTER — Telehealth: Payer: Self-pay

## 2015-07-09 NOTE — Telephone Encounter (Signed)
Brandon Melnick from hospice care called regarding pt. Pt has had increased frequency of urination; having to go about four times an hour. They are going to run a urinalysis and send you the results. They have also noticed increased restlessness with pt. She has not been sleeping well at night. Her caregiver was wondering if there was anything else besides Remeron they could use at night. They have been giving her .25-.5mg  of Ativan twice a day. When pt takes Ativan she is not as restless.  Pt seems to be weaker after leaving hospice.  Brandon Melnick contact info: Before 5pm: [phone] After 5pm: [phone]

## 2015-07-09 NOTE — Telephone Encounter (Signed)
Hospice nurse called back and added.  Pt is out of the  remeron.  Does Dr. Everlene Farrier want to refill or change her sleep aid?  Hospice does cover sleep aids.  Care giver believes the remeron is not effective.

## 2015-07-10 NOTE — Telephone Encounter (Signed)
I called and spoke with the hospice coordinator. She will contact the nurse tomorrow and have her call me.

## 2015-07-10 NOTE — Telephone Encounter (Signed)
Spoke with the nurse, (correction)she has been taking one at bedtime so she was wondering if she can take 1 mg at bedtime. Can we send in new Rx?

## 2015-07-10 NOTE — Telephone Encounter (Signed)
Spoke with nurse, she states the pt is on 0.5 mg twice daily and thinks she should take one at bedtime. Pt is very biligerent at bedtime, not wanting to go to bed. She got up in the middle of the night and tried to get in her wheelchair and fell.

## 2015-07-10 NOTE — Telephone Encounter (Signed)
Please be sure the prescription reads to take Ativan twice a day and one at bedtime.

## 2015-07-10 NOTE — Telephone Encounter (Signed)
Please call the hospice nurse. Please see if she thinks may be giving Savannah Todd Ativan 0.5 at bedtime would be better than giving her Remeron

## 2015-07-13 ENCOUNTER — Other Ambulatory Visit: Payer: Self-pay | Admitting: Emergency Medicine

## 2015-07-16 ENCOUNTER — Telehealth: Payer: Self-pay

## 2015-07-16 NOTE — Telephone Encounter (Signed)
Maura the nurse from Hospice called and stated pt is still feeling restless and agitated with being on Lorazepam three times a day. She even started to hit people so this weekend their doctor called in Haldol 2mg  every 4 hours as needed.She does not really know if this helped. They were advised to alternate between the two medications. Cristela Blue will keep [place] updated but if you would like to speak with her call [phone].

## 2015-07-22 DIAGNOSIS — N039 Chronic nephritic syndrome with unspecified morphologic changes: Secondary | ICD-10-CM | POA: Diagnosis not present

## 2015-07-22 DIAGNOSIS — N390 Urinary tract infection, site not specified: Secondary | ICD-10-CM | POA: Diagnosis not present

## 2015-07-22 DIAGNOSIS — R630 Anorexia: Secondary | ICD-10-CM | POA: Diagnosis not present

## 2015-07-22 DIAGNOSIS — G309 Alzheimer's disease, unspecified: Secondary | ICD-10-CM | POA: Diagnosis not present

## 2015-07-22 DIAGNOSIS — I509 Heart failure, unspecified: Secondary | ICD-10-CM | POA: Diagnosis not present

## 2015-07-31 ENCOUNTER — Other Ambulatory Visit: Payer: Self-pay

## 2015-07-31 MED ORDER — LORAZEPAM 0.5 MG PO TABS: 0.2500 mg | ORAL_TABLET | Freq: Two times a day (BID) | ORAL | Status: DC

## 2015-07-31 NOTE — Telephone Encounter (Signed)
Refill provided. Please let hospice nurse know.

## 2015-07-31 NOTE — Telephone Encounter (Signed)
Regina, Hospice RN, called to request a refill of pt's Ativan. If there is a problem she may be reached at [phone]. Otherwise pharm lets pt know when Rxs are ready.

## 2015-08-01 NOTE — Telephone Encounter (Signed)
Rx faxed

## 2015-08-13 ENCOUNTER — Other Ambulatory Visit: Payer: Self-pay | Admitting: Family Medicine

## 2015-08-13 DIAGNOSIS — R5381 Other malaise: Secondary | ICD-10-CM

## 2015-08-14 ENCOUNTER — Other Ambulatory Visit: Payer: Self-pay | Admitting: Physician Assistant

## 2015-08-15 NOTE — Telephone Encounter (Signed)
Dr L, you saw pt for a check up in May, but don't see a TSH since last Oct. OK to give RFs w/note she needs labs for more?

## 2015-08-16 ENCOUNTER — Telehealth: Payer: Self-pay

## 2015-08-16 NOTE — Telephone Encounter (Signed)
Abigail Butts from [facility] is calling because Dr. Joseph Art sent a fax to request home health for the patient. Abigail Butts states that the patient cannot have both home health at the same time. Phone: [phone]

## 2015-08-20 ENCOUNTER — Telehealth: Payer: Self-pay

## 2015-08-20 NOTE — Telephone Encounter (Signed)
Patient can take up to 4 tablets of 625 mg acetaminophen in a 24-hour period.

## 2015-08-20 NOTE — Telephone Encounter (Signed)
Hospice nurse is calling stating that patient is having more arthritic pain and the care giver has noticed increased swelling in hand and that the pain is waking her up at night and would like to know if we can increase the pain meds   Best number [phone]

## 2015-08-21 NOTE — Telephone Encounter (Signed)
Dr. Everlene Farrier the hospice nurse called and she states the patient may need something stronger than the 625 mg.  She says she is also complaining of headache/backache along with right hand pain.    She also states that the care giver gave her phenazopyridine 95 mg strength 2 tabs 3 times a day because she woke up last night voiding 6 times and having pain.  Does state today that the symptoms are much better. She wanted to inform you that on her physical exam today her heart rate was 49 and she appears to be weaker

## 2015-08-22 DIAGNOSIS — I509 Heart failure, unspecified: Secondary | ICD-10-CM | POA: Diagnosis not present

## 2015-08-22 DIAGNOSIS — E039 Hypothyroidism, unspecified: Secondary | ICD-10-CM | POA: Diagnosis not present

## 2015-08-22 DIAGNOSIS — N390 Urinary tract infection, site not specified: Secondary | ICD-10-CM | POA: Diagnosis not present

## 2015-08-22 DIAGNOSIS — R630 Anorexia: Secondary | ICD-10-CM | POA: Diagnosis not present

## 2015-08-22 DIAGNOSIS — G309 Alzheimer's disease, unspecified: Secondary | ICD-10-CM | POA: Diagnosis not present

## 2015-08-22 NOTE — Telephone Encounter (Signed)
I called and spoke with hospice nurse. She will take a prednisone taper. She will collect the urine for culture. She will take a maximum of 2500 mg Tylenol per day.

## 2015-08-23 ENCOUNTER — Ambulatory Visit: Payer: Medicare Other | Admitting: Neurology

## 2015-08-29 ENCOUNTER — Telehealth: Payer: Self-pay

## 2015-08-29 ENCOUNTER — Other Ambulatory Visit: Payer: Self-pay | Admitting: Emergency Medicine

## 2015-08-29 MED ORDER — LORAZEPAM 0.5 MG PO TABS: 0.2500 mg | ORAL_TABLET | Freq: Two times a day (BID) | ORAL | Status: DC

## 2015-08-29 NOTE — Telephone Encounter (Signed)
Savannah Todd called in for pt wanting to get a refill on her LORazepam (ATIVAN) 0.5 MG tablet [[id number]] script. Pharmacy is good. Please advise at [phone]

## 2015-08-30 NOTE — Telephone Encounter (Signed)
Rx called in 

## 2015-09-19 ENCOUNTER — Other Ambulatory Visit: Payer: Self-pay | Admitting: Emergency Medicine

## 2015-09-19 DIAGNOSIS — M549 Dorsalgia, unspecified: Secondary | ICD-10-CM

## 2015-09-19 MED ORDER — TRAMADOL HCL 50 MG PO TABS: ORAL_TABLET | ORAL | Status: DC

## 2015-09-21 DIAGNOSIS — I509 Heart failure, unspecified: Secondary | ICD-10-CM | POA: Diagnosis not present

## 2015-09-21 DIAGNOSIS — G309 Alzheimer's disease, unspecified: Secondary | ICD-10-CM | POA: Diagnosis not present

## 2015-09-21 DIAGNOSIS — R630 Anorexia: Secondary | ICD-10-CM | POA: Diagnosis not present

## 2015-09-21 DIAGNOSIS — E039 Hypothyroidism, unspecified: Secondary | ICD-10-CM | POA: Diagnosis not present

## 2015-09-21 DIAGNOSIS — N390 Urinary tract infection, site not specified: Secondary | ICD-10-CM | POA: Diagnosis not present

## 2015-09-24 ENCOUNTER — Encounter: Payer: Self-pay | Admitting: Emergency Medicine

## 2015-10-22 DIAGNOSIS — E039 Hypothyroidism, unspecified: Secondary | ICD-10-CM | POA: Diagnosis not present

## 2015-10-22 DIAGNOSIS — N390 Urinary tract infection, site not specified: Secondary | ICD-10-CM | POA: Diagnosis not present

## 2015-10-22 DIAGNOSIS — G309 Alzheimer's disease, unspecified: Secondary | ICD-10-CM | POA: Diagnosis not present

## 2015-10-22 DIAGNOSIS — R630 Anorexia: Secondary | ICD-10-CM | POA: Diagnosis not present

## 2015-10-22 DIAGNOSIS — I509 Heart failure, unspecified: Secondary | ICD-10-CM | POA: Diagnosis not present

## 2015-11-02 ENCOUNTER — Other Ambulatory Visit: Payer: Self-pay | Admitting: Emergency Medicine

## 2015-11-06 NOTE — Telephone Encounter (Signed)
Dr L, I don't see that we have Rxd this for pt before. Ok to RF?

## 2015-11-21 ENCOUNTER — Telehealth: Payer: Self-pay

## 2015-11-21 DIAGNOSIS — R630 Anorexia: Secondary | ICD-10-CM | POA: Diagnosis not present

## 2015-11-21 DIAGNOSIS — I509 Heart failure, unspecified: Secondary | ICD-10-CM | POA: Diagnosis not present

## 2015-11-21 DIAGNOSIS — N390 Urinary tract infection, site not specified: Secondary | ICD-10-CM | POA: Diagnosis not present

## 2015-11-21 DIAGNOSIS — E039 Hypothyroidism, unspecified: Secondary | ICD-10-CM | POA: Diagnosis not present

## 2015-11-21 DIAGNOSIS — G309 Alzheimer's disease, unspecified: Secondary | ICD-10-CM | POA: Diagnosis not present

## 2015-11-21 NOTE — Telephone Encounter (Signed)
[facility] nurse wanted to let dr Everlene Farrier know that pt seems to be stable and is still declining -in a slow state than last week   [phone]

## 2015-12-22 DIAGNOSIS — G309 Alzheimer's disease, unspecified: Secondary | ICD-10-CM | POA: Diagnosis not present

## 2015-12-22 DIAGNOSIS — E039 Hypothyroidism, unspecified: Secondary | ICD-10-CM | POA: Diagnosis not present

## 2015-12-22 DIAGNOSIS — N390 Urinary tract infection, site not specified: Secondary | ICD-10-CM | POA: Diagnosis not present

## 2015-12-22 DIAGNOSIS — R630 Anorexia: Secondary | ICD-10-CM | POA: Diagnosis not present

## 2015-12-22 DIAGNOSIS — I509 Heart failure, unspecified: Secondary | ICD-10-CM | POA: Diagnosis not present

## 2015-12-25 ENCOUNTER — Other Ambulatory Visit: Payer: Self-pay | Admitting: Emergency Medicine

## 2015-12-27 NOTE — Telephone Encounter (Signed)
Faxed

## 2016-01-01 ENCOUNTER — Telehealth: Payer: Self-pay | Admitting: *Deleted

## 2016-01-01 NOTE — Telephone Encounter (Signed)
Call nurse and let her know I very much appreciate their care.

## 2016-01-01 NOTE — Telephone Encounter (Signed)
Maura the nurse for hospice called and wanted to update you on a few things about pt.  1. Today their physician saw pt and stated that she is still showing slow signs of decline and will be continued to be eligible for Hospice. 2. They changed her frequency on her tramadol 1 tab every 4 hours as needed for pain.  Lorazepam changed to 1 TID.

## 2016-01-05 NOTE — Telephone Encounter (Signed)
LMOM

## 2016-01-07 ENCOUNTER — Other Ambulatory Visit: Payer: Self-pay | Admitting: Emergency Medicine

## 2016-01-07 ENCOUNTER — Telehealth: Payer: Self-pay

## 2016-01-07 MED ORDER — TRAMADOL HCL 50 MG PO TABS: ORAL_TABLET | ORAL | Status: DC

## 2016-01-07 NOTE — Telephone Encounter (Signed)
Mickel Baas hospice RN called regarding patient's tramadol.  She increased the dosage last week from1/2 tablet Q8hrs prn to1/2 to 1 tab Q4hrs prn pain and that patient would be out of medicine tomorrow because she only has one more pill left.  I called Dr. Everlene Farrier and he said that he would send in the RX in the morning and to advise Mickel Baas to give patient 1/2 pill and 1/2 pill as needed for pain to get her through until tomorrow.  I then called Mickel Baas back [phone]) and advised her of what Dr. Everlene Farrier had said.  She said she would do this.

## 2016-01-08 NOTE — Telephone Encounter (Signed)
Faxed Rx

## 2016-01-09 ENCOUNTER — Other Ambulatory Visit: Payer: Self-pay | Admitting: Emergency Medicine

## 2016-01-13 NOTE — Telephone Encounter (Signed)
Faxed

## 2016-01-22 DIAGNOSIS — I509 Heart failure, unspecified: Secondary | ICD-10-CM | POA: Diagnosis not present

## 2016-01-22 DIAGNOSIS — R630 Anorexia: Secondary | ICD-10-CM | POA: Diagnosis not present

## 2016-01-22 DIAGNOSIS — G309 Alzheimer's disease, unspecified: Secondary | ICD-10-CM | POA: Diagnosis not present

## 2016-01-22 DIAGNOSIS — E039 Hypothyroidism, unspecified: Secondary | ICD-10-CM | POA: Diagnosis not present

## 2016-01-22 DIAGNOSIS — N390 Urinary tract infection, site not specified: Secondary | ICD-10-CM | POA: Diagnosis not present

## 2016-02-13 ENCOUNTER — Telehealth: Payer: Self-pay | Admitting: *Deleted

## 2016-02-13 ENCOUNTER — Other Ambulatory Visit: Payer: Self-pay | Admitting: Emergency Medicine

## 2016-02-13 MED ORDER — ERYTHROMYCIN 5 MG/GM OP OINT: 1.0000 "application " | TOPICAL_OINTMENT | Freq: Three times a day (TID) | OPHTHALMIC | Status: AC

## 2016-02-13 NOTE — Telephone Encounter (Signed)
Brandon Melnick RN at [facility] called concerning Ms Glade. She is weaker and has very very redness of eyelids with drainage no prulent, pale yellow and discharge from eyes. Also, upper edema without redness or tenderness, possible fluid. The nurse would like Dr Everlene Farrier to prescribe an eye medication for patient. I advised Dr Everlene Farrier and he E-scribed an erythromycin opthalmic ointment for patient.

## 2016-02-19 DIAGNOSIS — R630 Anorexia: Secondary | ICD-10-CM | POA: Diagnosis not present

## 2016-02-19 DIAGNOSIS — N390 Urinary tract infection, site not specified: Secondary | ICD-10-CM | POA: Diagnosis not present

## 2016-02-19 DIAGNOSIS — E039 Hypothyroidism, unspecified: Secondary | ICD-10-CM | POA: Diagnosis not present

## 2016-02-19 DIAGNOSIS — G309 Alzheimer's disease, unspecified: Secondary | ICD-10-CM | POA: Diagnosis not present

## 2016-02-19 DIAGNOSIS — I509 Heart failure, unspecified: Secondary | ICD-10-CM | POA: Diagnosis not present

## 2016-02-19 NOTE — Telephone Encounter (Signed)
Entered in error

## 2016-03-21 DIAGNOSIS — G309 Alzheimer's disease, unspecified: Secondary | ICD-10-CM | POA: Diagnosis not present

## 2016-03-21 DIAGNOSIS — E039 Hypothyroidism, unspecified: Secondary | ICD-10-CM | POA: Diagnosis not present

## 2016-03-21 DIAGNOSIS — N390 Urinary tract infection, site not specified: Secondary | ICD-10-CM | POA: Diagnosis not present

## 2016-03-21 DIAGNOSIS — I509 Heart failure, unspecified: Secondary | ICD-10-CM | POA: Diagnosis not present

## 2016-03-21 DIAGNOSIS — R630 Anorexia: Secondary | ICD-10-CM | POA: Diagnosis not present

## 2016-04-04 ENCOUNTER — Telehealth: Payer: Self-pay | Admitting: *Deleted

## 2016-04-04 ENCOUNTER — Other Ambulatory Visit: Payer: Self-pay | Admitting: Emergency Medicine

## 2016-04-04 NOTE — Telephone Encounter (Signed)
Faxed in

## 2016-04-16 ENCOUNTER — Other Ambulatory Visit: Payer: Self-pay | Admitting: *Deleted

## 2016-04-16 MED ORDER — AMOXICILLIN 250 MG/5ML PO SUSR: 500.0000 mg | Freq: Two times a day (BID) | ORAL | Status: DC

## 2016-04-16 NOTE — Telephone Encounter (Signed)
Nurse Brandon Melnick [phone] notified  rx sent to pharmacy and needs to recheck culture in 2 weeks.

## 2016-04-20 ENCOUNTER — Telehealth: Payer: Self-pay

## 2016-04-20 DIAGNOSIS — N390 Urinary tract infection, site not specified: Secondary | ICD-10-CM | POA: Diagnosis not present

## 2016-04-20 DIAGNOSIS — R630 Anorexia: Secondary | ICD-10-CM | POA: Diagnosis not present

## 2016-04-20 DIAGNOSIS — G309 Alzheimer's disease, unspecified: Secondary | ICD-10-CM | POA: Diagnosis not present

## 2016-04-20 DIAGNOSIS — I509 Heart failure, unspecified: Secondary | ICD-10-CM | POA: Diagnosis not present

## 2016-04-20 DIAGNOSIS — E039 Hypothyroidism, unspecified: Secondary | ICD-10-CM | POA: Diagnosis not present

## 2016-04-20 NOTE — Telephone Encounter (Signed)
Pt seen today by hospice doctor as required by Medicare. Pt appears to be declining in health. Urine has significant sediment and hospice MD suggests extending abx dose to five more days.  Call back number.Marland KitchenMarland KitchenMauraAF:[phone]

## 2016-04-21 MED ORDER — AMOXICILLIN 250 MG/5ML PO SUSR: 500.0000 mg | Freq: Two times a day (BID) | ORAL | Status: AC

## 2016-04-21 NOTE — Telephone Encounter (Signed)
Please extend antibiotic dose for 5 more days

## 2016-04-21 NOTE — Telephone Encounter (Signed)
[facility] called in. Maura advised and will let pt know.

## 2016-05-03 ENCOUNTER — Other Ambulatory Visit: Payer: Self-pay | Admitting: Emergency Medicine

## 2016-05-05 ENCOUNTER — Telehealth: Payer: Self-pay | Admitting: *Deleted

## 2016-05-21 DIAGNOSIS — E039 Hypothyroidism, unspecified: Secondary | ICD-10-CM | POA: Diagnosis not present

## 2016-05-21 DIAGNOSIS — N390 Urinary tract infection, site not specified: Secondary | ICD-10-CM | POA: Diagnosis not present

## 2016-05-21 DIAGNOSIS — I509 Heart failure, unspecified: Secondary | ICD-10-CM | POA: Diagnosis not present

## 2016-05-21 DIAGNOSIS — R630 Anorexia: Secondary | ICD-10-CM | POA: Diagnosis not present

## 2016-05-21 DIAGNOSIS — G309 Alzheimer's disease, unspecified: Secondary | ICD-10-CM | POA: Diagnosis not present

## 2016-05-31 ENCOUNTER — Other Ambulatory Visit: Payer: Self-pay | Admitting: Family Medicine

## 2016-06-08 ENCOUNTER — Telehealth: Payer: Self-pay

## 2016-06-08 NOTE — Telephone Encounter (Signed)
Received fax with Hospice orders that need to be signed re: medication therapy. Put form in Dr Lenn Cal box.

## 2016-06-20 DIAGNOSIS — R630 Anorexia: Secondary | ICD-10-CM | POA: Diagnosis not present

## 2016-06-20 DIAGNOSIS — N390 Urinary tract infection, site not specified: Secondary | ICD-10-CM | POA: Diagnosis not present

## 2016-06-20 DIAGNOSIS — G309 Alzheimer's disease, unspecified: Secondary | ICD-10-CM | POA: Diagnosis not present

## 2016-06-20 DIAGNOSIS — E039 Hypothyroidism, unspecified: Secondary | ICD-10-CM | POA: Diagnosis not present

## 2016-06-20 DIAGNOSIS — I509 Heart failure, unspecified: Secondary | ICD-10-CM | POA: Diagnosis not present

## 2016-07-21 DIAGNOSIS — E039 Hypothyroidism, unspecified: Secondary | ICD-10-CM | POA: Diagnosis not present

## 2016-07-21 DIAGNOSIS — N390 Urinary tract infection, site not specified: Secondary | ICD-10-CM | POA: Diagnosis not present

## 2016-07-21 DIAGNOSIS — I509 Heart failure, unspecified: Secondary | ICD-10-CM | POA: Diagnosis not present

## 2016-07-21 DIAGNOSIS — R630 Anorexia: Secondary | ICD-10-CM | POA: Diagnosis not present

## 2016-07-21 DIAGNOSIS — G309 Alzheimer's disease, unspecified: Secondary | ICD-10-CM | POA: Diagnosis not present

## 2016-08-20 ENCOUNTER — Other Ambulatory Visit: Payer: Self-pay

## 2016-08-20 NOTE — Telephone Encounter (Signed)
Maura from Hospice called on behalf of [place]. Maura request for an order for thick-it for the patient. Patient need a refill of Flagyl. [phone] Cristela Blue)

## 2016-08-21 DIAGNOSIS — G309 Alzheimer's disease, unspecified: Secondary | ICD-10-CM | POA: Diagnosis not present

## 2016-08-21 DIAGNOSIS — I509 Heart failure, unspecified: Secondary | ICD-10-CM | POA: Diagnosis not present

## 2016-08-21 DIAGNOSIS — R630 Anorexia: Secondary | ICD-10-CM | POA: Diagnosis not present

## 2016-08-21 DIAGNOSIS — N390 Urinary tract infection, site not specified: Secondary | ICD-10-CM | POA: Diagnosis not present

## 2016-08-21 DIAGNOSIS — E039 Hypothyroidism, unspecified: Secondary | ICD-10-CM | POA: Diagnosis not present

## 2016-08-21 MED ORDER — UNABLE TO FIND: 0 refills | Status: DC

## 2016-08-21 NOTE — Telephone Encounter (Signed)
I called the number listed and it is the wrong number.

## 2016-08-21 NOTE — Telephone Encounter (Signed)
Spoke with nurse, she was requesting the Flagyl for the odor for the wound. Her doctor is not here and was wondering if Dr. Everlene Farrier could send in the medication Flagyl 500mg  #10 They cut it in half and sprinkle it over the wound.  Instructions for Thick IT? Rx pended.  [phone]

## 2016-08-21 NOTE — Telephone Encounter (Signed)
Please place the order for her. Also she is not on Flagyl. Does she need a prescription for the Remeron?

## 2016-08-22 NOTE — Telephone Encounter (Signed)
It is okay to refill both of these request. I am not clear about the instructions. These are not products IUs. Please contact the hospice nurse on call today regarding [place] and find out for sure how the instruction should be placed.

## 2016-08-26 ENCOUNTER — Other Ambulatory Visit: Payer: Self-pay | Admitting: *Deleted

## 2016-08-26 DIAGNOSIS — R1310 Dysphagia, unspecified: Secondary | ICD-10-CM

## 2016-08-26 MED ORDER — UNABLE TO FIND: 0 refills | Status: AC

## 2016-08-26 NOTE — Telephone Encounter (Signed)
Spoke with pharmacist, called in bother prescriptions

## 2016-09-03 ENCOUNTER — Other Ambulatory Visit: Payer: Self-pay | Admitting: Emergency Medicine

## 2016-09-20 DIAGNOSIS — E039 Hypothyroidism, unspecified: Secondary | ICD-10-CM | POA: Diagnosis not present

## 2016-09-20 DIAGNOSIS — G309 Alzheimer's disease, unspecified: Secondary | ICD-10-CM | POA: Diagnosis not present

## 2016-09-20 DIAGNOSIS — R630 Anorexia: Secondary | ICD-10-CM | POA: Diagnosis not present

## 2016-09-20 DIAGNOSIS — I509 Heart failure, unspecified: Secondary | ICD-10-CM | POA: Diagnosis not present

## 2016-09-20 DIAGNOSIS — N390 Urinary tract infection, site not specified: Secondary | ICD-10-CM | POA: Diagnosis not present

## 2016-09-23 ENCOUNTER — Other Ambulatory Visit: Payer: Self-pay | Admitting: Family Medicine

## 2016-09-29 NOTE — Telephone Encounter (Signed)
Meds ordered this encounter  Medications  . polyethylene glycol powder (GLYCOLAX/MIRALAX) powder    Sig: MIX 17 GRAMS IN 4-8 OUNCES OF LIQUID AND DRINK DAILY AS NEEDED FOR CONSTIPATION.    Dispense:  527 g    Refill:  prn

## 2016-09-29 NOTE — Telephone Encounter (Signed)
Last office visit 04/2015, last real labs 09/2014 Dr Joseph Art, dr daub patient.

## 2016-10-02 ENCOUNTER — Other Ambulatory Visit: Payer: Self-pay | Admitting: Emergency Medicine

## 2016-10-21 DIAGNOSIS — G309 Alzheimer's disease, unspecified: Secondary | ICD-10-CM | POA: Diagnosis not present

## 2016-10-21 DIAGNOSIS — E039 Hypothyroidism, unspecified: Secondary | ICD-10-CM | POA: Diagnosis not present

## 2016-10-21 DIAGNOSIS — R630 Anorexia: Secondary | ICD-10-CM | POA: Diagnosis not present

## 2016-10-21 DIAGNOSIS — I509 Heart failure, unspecified: Secondary | ICD-10-CM | POA: Diagnosis not present

## 2016-10-21 DIAGNOSIS — N390 Urinary tract infection, site not specified: Secondary | ICD-10-CM | POA: Diagnosis not present

## 2016-11-03 ENCOUNTER — Telehealth: Payer: Self-pay

## 2016-11-03 NOTE — Telephone Encounter (Signed)
Hospice nurse called.  Tried to pick up call but no one was one the line.  Tried calling her back, however, there was no answer.  I left a VM that she could call us back.

## 2016-11-03 NOTE — Telephone Encounter (Signed)
Hospice nurse called and stated that patient continues to be hospice eligible.  They need a PCP to assign to patient since Dr. Everlene Farrier has retired.  Please advise.  Her phone number is [phone]

## 2016-11-03 NOTE — Telephone Encounter (Signed)
I am happy to sign the forms, but I believe that Medicare will require a physician sign them.  The patient hasn't been here since 04/2015. I recommend that we ask the patient to come in for a visit with whichever physician signs the forms.

## 2016-11-05 NOTE — Telephone Encounter (Signed)
Spoke with Brandon Melnick who is [facility]. The patient is totally bed bound and unable to come in to see anyone here. Cristela Blue seemed to think that the nursing supervisor: Mariel Kansky # [phone] would know what we need to do so I left a message on her machine to get back with [place] to further discuss what we need to do for this patient.

## 2016-11-05 NOTE — Telephone Encounter (Signed)
Mariel Kansky called back and let us know that Dr. Nolon Rod has agreed to take over Ms Torosyan's care.

## 2016-11-20 DIAGNOSIS — R630 Anorexia: Secondary | ICD-10-CM | POA: Diagnosis not present

## 2016-11-20 DIAGNOSIS — E039 Hypothyroidism, unspecified: Secondary | ICD-10-CM | POA: Diagnosis not present

## 2016-11-20 DIAGNOSIS — G309 Alzheimer's disease, unspecified: Secondary | ICD-10-CM | POA: Diagnosis not present

## 2016-11-20 DIAGNOSIS — N390 Urinary tract infection, site not specified: Secondary | ICD-10-CM | POA: Diagnosis not present

## 2016-11-20 DIAGNOSIS — I509 Heart failure, unspecified: Secondary | ICD-10-CM | POA: Diagnosis not present

## 2016-12-21 DIAGNOSIS — E039 Hypothyroidism, unspecified: Secondary | ICD-10-CM | POA: Diagnosis not present

## 2016-12-21 DIAGNOSIS — N390 Urinary tract infection, site not specified: Secondary | ICD-10-CM | POA: Diagnosis not present

## 2016-12-21 DIAGNOSIS — I509 Heart failure, unspecified: Secondary | ICD-10-CM | POA: Diagnosis not present

## 2016-12-21 DIAGNOSIS — R630 Anorexia: Secondary | ICD-10-CM | POA: Diagnosis not present

## 2016-12-21 DIAGNOSIS — G309 Alzheimer's disease, unspecified: Secondary | ICD-10-CM | POA: Diagnosis not present

## 2016-12-22 DIAGNOSIS — G309 Alzheimer's disease, unspecified: Secondary | ICD-10-CM | POA: Diagnosis not present

## 2016-12-22 DIAGNOSIS — N390 Urinary tract infection, site not specified: Secondary | ICD-10-CM | POA: Diagnosis not present

## 2016-12-22 DIAGNOSIS — I509 Heart failure, unspecified: Secondary | ICD-10-CM | POA: Diagnosis not present

## 2016-12-22 DIAGNOSIS — E039 Hypothyroidism, unspecified: Secondary | ICD-10-CM | POA: Diagnosis not present

## 2016-12-22 DIAGNOSIS — R630 Anorexia: Secondary | ICD-10-CM | POA: Diagnosis not present

## 2016-12-23 DIAGNOSIS — E039 Hypothyroidism, unspecified: Secondary | ICD-10-CM | POA: Diagnosis not present

## 2016-12-23 DIAGNOSIS — I509 Heart failure, unspecified: Secondary | ICD-10-CM | POA: Diagnosis not present

## 2016-12-23 DIAGNOSIS — G309 Alzheimer's disease, unspecified: Secondary | ICD-10-CM | POA: Diagnosis not present

## 2016-12-23 DIAGNOSIS — R630 Anorexia: Secondary | ICD-10-CM | POA: Diagnosis not present

## 2016-12-23 DIAGNOSIS — N390 Urinary tract infection, site not specified: Secondary | ICD-10-CM | POA: Diagnosis not present

## 2016-12-25 DIAGNOSIS — R630 Anorexia: Secondary | ICD-10-CM | POA: Diagnosis not present

## 2016-12-25 DIAGNOSIS — I509 Heart failure, unspecified: Secondary | ICD-10-CM | POA: Diagnosis not present

## 2016-12-25 DIAGNOSIS — E039 Hypothyroidism, unspecified: Secondary | ICD-10-CM | POA: Diagnosis not present

## 2016-12-25 DIAGNOSIS — G309 Alzheimer's disease, unspecified: Secondary | ICD-10-CM | POA: Diagnosis not present

## 2016-12-25 DIAGNOSIS — N390 Urinary tract infection, site not specified: Secondary | ICD-10-CM | POA: Diagnosis not present

## 2016-12-28 DIAGNOSIS — N390 Urinary tract infection, site not specified: Secondary | ICD-10-CM | POA: Diagnosis not present

## 2016-12-28 DIAGNOSIS — E039 Hypothyroidism, unspecified: Secondary | ICD-10-CM | POA: Diagnosis not present

## 2016-12-28 DIAGNOSIS — I509 Heart failure, unspecified: Secondary | ICD-10-CM | POA: Diagnosis not present

## 2016-12-28 DIAGNOSIS — G309 Alzheimer's disease, unspecified: Secondary | ICD-10-CM | POA: Diagnosis not present

## 2016-12-28 DIAGNOSIS — R630 Anorexia: Secondary | ICD-10-CM | POA: Diagnosis not present

## 2016-12-30 DIAGNOSIS — E039 Hypothyroidism, unspecified: Secondary | ICD-10-CM | POA: Diagnosis not present

## 2016-12-30 DIAGNOSIS — R630 Anorexia: Secondary | ICD-10-CM | POA: Diagnosis not present

## 2016-12-30 DIAGNOSIS — I509 Heart failure, unspecified: Secondary | ICD-10-CM | POA: Diagnosis not present

## 2016-12-30 DIAGNOSIS — G309 Alzheimer's disease, unspecified: Secondary | ICD-10-CM | POA: Diagnosis not present

## 2016-12-30 DIAGNOSIS — N390 Urinary tract infection, site not specified: Secondary | ICD-10-CM | POA: Diagnosis not present

## 2017-01-01 DIAGNOSIS — N390 Urinary tract infection, site not specified: Secondary | ICD-10-CM | POA: Diagnosis not present

## 2017-01-01 DIAGNOSIS — I509 Heart failure, unspecified: Secondary | ICD-10-CM | POA: Diagnosis not present

## 2017-01-01 DIAGNOSIS — G309 Alzheimer's disease, unspecified: Secondary | ICD-10-CM | POA: Diagnosis not present

## 2017-01-01 DIAGNOSIS — E039 Hypothyroidism, unspecified: Secondary | ICD-10-CM | POA: Diagnosis not present

## 2017-01-01 DIAGNOSIS — R630 Anorexia: Secondary | ICD-10-CM | POA: Diagnosis not present

## 2017-01-02 DIAGNOSIS — G309 Alzheimer's disease, unspecified: Secondary | ICD-10-CM | POA: Diagnosis not present

## 2017-01-02 DIAGNOSIS — I509 Heart failure, unspecified: Secondary | ICD-10-CM | POA: Diagnosis not present

## 2017-01-02 DIAGNOSIS — N390 Urinary tract infection, site not specified: Secondary | ICD-10-CM | POA: Diagnosis not present

## 2017-01-02 DIAGNOSIS — E039 Hypothyroidism, unspecified: Secondary | ICD-10-CM | POA: Diagnosis not present

## 2017-01-02 DIAGNOSIS — R630 Anorexia: Secondary | ICD-10-CM | POA: Diagnosis not present

## 2017-01-03 DIAGNOSIS — R630 Anorexia: Secondary | ICD-10-CM | POA: Diagnosis not present

## 2017-01-03 DIAGNOSIS — E039 Hypothyroidism, unspecified: Secondary | ICD-10-CM | POA: Diagnosis not present

## 2017-01-03 DIAGNOSIS — N390 Urinary tract infection, site not specified: Secondary | ICD-10-CM | POA: Diagnosis not present

## 2017-01-03 DIAGNOSIS — I509 Heart failure, unspecified: Secondary | ICD-10-CM | POA: Diagnosis not present

## 2017-01-03 DIAGNOSIS — G309 Alzheimer's disease, unspecified: Secondary | ICD-10-CM | POA: Diagnosis not present

## 2017-01-04 DIAGNOSIS — E039 Hypothyroidism, unspecified: Secondary | ICD-10-CM | POA: Diagnosis not present

## 2017-01-04 DIAGNOSIS — N390 Urinary tract infection, site not specified: Secondary | ICD-10-CM | POA: Diagnosis not present

## 2017-01-04 DIAGNOSIS — R630 Anorexia: Secondary | ICD-10-CM | POA: Diagnosis not present

## 2017-01-04 DIAGNOSIS — G309 Alzheimer's disease, unspecified: Secondary | ICD-10-CM | POA: Diagnosis not present

## 2017-01-04 DIAGNOSIS — I509 Heart failure, unspecified: Secondary | ICD-10-CM | POA: Diagnosis not present

## 2017-01-08 DIAGNOSIS — R630 Anorexia: Secondary | ICD-10-CM | POA: Diagnosis not present

## 2017-01-08 DIAGNOSIS — N390 Urinary tract infection, site not specified: Secondary | ICD-10-CM | POA: Diagnosis not present

## 2017-01-08 DIAGNOSIS — G309 Alzheimer's disease, unspecified: Secondary | ICD-10-CM | POA: Diagnosis not present

## 2017-01-08 DIAGNOSIS — E039 Hypothyroidism, unspecified: Secondary | ICD-10-CM | POA: Diagnosis not present

## 2017-01-08 DIAGNOSIS — I509 Heart failure, unspecified: Secondary | ICD-10-CM | POA: Diagnosis not present

## 2017-01-11 DIAGNOSIS — G309 Alzheimer's disease, unspecified: Secondary | ICD-10-CM | POA: Diagnosis not present

## 2017-01-11 DIAGNOSIS — R630 Anorexia: Secondary | ICD-10-CM | POA: Diagnosis not present

## 2017-01-11 DIAGNOSIS — E039 Hypothyroidism, unspecified: Secondary | ICD-10-CM | POA: Diagnosis not present

## 2017-01-11 DIAGNOSIS — I509 Heart failure, unspecified: Secondary | ICD-10-CM | POA: Diagnosis not present

## 2017-01-11 DIAGNOSIS — N390 Urinary tract infection, site not specified: Secondary | ICD-10-CM | POA: Diagnosis not present

## 2017-01-13 DIAGNOSIS — N390 Urinary tract infection, site not specified: Secondary | ICD-10-CM | POA: Diagnosis not present

## 2017-01-13 DIAGNOSIS — R630 Anorexia: Secondary | ICD-10-CM | POA: Diagnosis not present

## 2017-01-13 DIAGNOSIS — E039 Hypothyroidism, unspecified: Secondary | ICD-10-CM | POA: Diagnosis not present

## 2017-01-13 DIAGNOSIS — I509 Heart failure, unspecified: Secondary | ICD-10-CM | POA: Diagnosis not present

## 2017-01-13 DIAGNOSIS — G309 Alzheimer's disease, unspecified: Secondary | ICD-10-CM | POA: Diagnosis not present

## 2017-01-15 DIAGNOSIS — G309 Alzheimer's disease, unspecified: Secondary | ICD-10-CM | POA: Diagnosis not present

## 2017-01-15 DIAGNOSIS — I509 Heart failure, unspecified: Secondary | ICD-10-CM | POA: Diagnosis not present

## 2017-01-15 DIAGNOSIS — E039 Hypothyroidism, unspecified: Secondary | ICD-10-CM | POA: Diagnosis not present

## 2017-01-15 DIAGNOSIS — N390 Urinary tract infection, site not specified: Secondary | ICD-10-CM | POA: Diagnosis not present

## 2017-01-15 DIAGNOSIS — R630 Anorexia: Secondary | ICD-10-CM | POA: Diagnosis not present

## 2017-01-18 DIAGNOSIS — R630 Anorexia: Secondary | ICD-10-CM | POA: Diagnosis not present

## 2017-01-18 DIAGNOSIS — N390 Urinary tract infection, site not specified: Secondary | ICD-10-CM | POA: Diagnosis not present

## 2017-01-18 DIAGNOSIS — I509 Heart failure, unspecified: Secondary | ICD-10-CM | POA: Diagnosis not present

## 2017-01-18 DIAGNOSIS — E039 Hypothyroidism, unspecified: Secondary | ICD-10-CM | POA: Diagnosis not present

## 2017-01-18 DIAGNOSIS — G309 Alzheimer's disease, unspecified: Secondary | ICD-10-CM | POA: Diagnosis not present

## 2017-01-20 DIAGNOSIS — G309 Alzheimer's disease, unspecified: Secondary | ICD-10-CM | POA: Diagnosis not present

## 2017-01-20 DIAGNOSIS — N390 Urinary tract infection, site not specified: Secondary | ICD-10-CM | POA: Diagnosis not present

## 2017-01-20 DIAGNOSIS — I509 Heart failure, unspecified: Secondary | ICD-10-CM | POA: Diagnosis not present

## 2017-01-20 DIAGNOSIS — R630 Anorexia: Secondary | ICD-10-CM | POA: Diagnosis not present

## 2017-01-20 DIAGNOSIS — E039 Hypothyroidism, unspecified: Secondary | ICD-10-CM | POA: Diagnosis not present

## 2017-01-21 DIAGNOSIS — N390 Urinary tract infection, site not specified: Secondary | ICD-10-CM | POA: Diagnosis not present

## 2017-01-21 DIAGNOSIS — L8994 Pressure ulcer of unspecified site, stage 4: Secondary | ICD-10-CM | POA: Diagnosis not present

## 2017-01-21 DIAGNOSIS — G309 Alzheimer's disease, unspecified: Secondary | ICD-10-CM | POA: Diagnosis not present

## 2017-01-21 DIAGNOSIS — I509 Heart failure, unspecified: Secondary | ICD-10-CM | POA: Diagnosis not present

## 2017-01-21 DIAGNOSIS — R630 Anorexia: Secondary | ICD-10-CM | POA: Diagnosis not present

## 2017-01-21 DIAGNOSIS — E039 Hypothyroidism, unspecified: Secondary | ICD-10-CM | POA: Diagnosis not present

## 2017-01-22 ENCOUNTER — Telehealth: Payer: Self-pay

## 2017-01-22 DIAGNOSIS — E039 Hypothyroidism, unspecified: Secondary | ICD-10-CM | POA: Diagnosis not present

## 2017-01-22 DIAGNOSIS — R630 Anorexia: Secondary | ICD-10-CM | POA: Diagnosis not present

## 2017-01-22 DIAGNOSIS — L8994 Pressure ulcer of unspecified site, stage 4: Secondary | ICD-10-CM | POA: Diagnosis not present

## 2017-01-22 DIAGNOSIS — N390 Urinary tract infection, site not specified: Secondary | ICD-10-CM | POA: Diagnosis not present

## 2017-01-22 DIAGNOSIS — I509 Heart failure, unspecified: Secondary | ICD-10-CM | POA: Diagnosis not present

## 2017-01-22 DIAGNOSIS — G309 Alzheimer's disease, unspecified: Secondary | ICD-10-CM | POA: Diagnosis not present

## 2017-01-22 NOTE — Telephone Encounter (Signed)
Faxed back today

## 2017-01-22 NOTE — Telephone Encounter (Signed)
[name] [id number]  NEEDS NEW ORDERS SIGNED BY DR Nolon Rod FOR RE CERT [id number] , THEY WILL REFAX

## 2017-01-25 DIAGNOSIS — E039 Hypothyroidism, unspecified: Secondary | ICD-10-CM | POA: Diagnosis not present

## 2017-01-25 DIAGNOSIS — I509 Heart failure, unspecified: Secondary | ICD-10-CM | POA: Diagnosis not present

## 2017-01-25 DIAGNOSIS — G309 Alzheimer's disease, unspecified: Secondary | ICD-10-CM | POA: Diagnosis not present

## 2017-01-25 DIAGNOSIS — R630 Anorexia: Secondary | ICD-10-CM | POA: Diagnosis not present

## 2017-01-25 DIAGNOSIS — L8994 Pressure ulcer of unspecified site, stage 4: Secondary | ICD-10-CM | POA: Diagnosis not present

## 2017-01-25 DIAGNOSIS — N390 Urinary tract infection, site not specified: Secondary | ICD-10-CM | POA: Diagnosis not present

## 2017-01-27 DIAGNOSIS — I509 Heart failure, unspecified: Secondary | ICD-10-CM | POA: Diagnosis not present

## 2017-01-27 DIAGNOSIS — E039 Hypothyroidism, unspecified: Secondary | ICD-10-CM | POA: Diagnosis not present

## 2017-01-27 DIAGNOSIS — G309 Alzheimer's disease, unspecified: Secondary | ICD-10-CM | POA: Diagnosis not present

## 2017-01-27 DIAGNOSIS — L8994 Pressure ulcer of unspecified site, stage 4: Secondary | ICD-10-CM | POA: Diagnosis not present

## 2017-01-27 DIAGNOSIS — N390 Urinary tract infection, site not specified: Secondary | ICD-10-CM | POA: Diagnosis not present

## 2017-01-27 DIAGNOSIS — R630 Anorexia: Secondary | ICD-10-CM | POA: Diagnosis not present

## 2017-01-28 NOTE — Telephone Encounter (Signed)
New order in your mailbox to sign

## 2017-01-29 DIAGNOSIS — G309 Alzheimer's disease, unspecified: Secondary | ICD-10-CM | POA: Diagnosis not present

## 2017-01-29 DIAGNOSIS — I509 Heart failure, unspecified: Secondary | ICD-10-CM | POA: Diagnosis not present

## 2017-01-29 DIAGNOSIS — N390 Urinary tract infection, site not specified: Secondary | ICD-10-CM | POA: Diagnosis not present

## 2017-01-29 DIAGNOSIS — R630 Anorexia: Secondary | ICD-10-CM | POA: Diagnosis not present

## 2017-01-29 DIAGNOSIS — L8994 Pressure ulcer of unspecified site, stage 4: Secondary | ICD-10-CM | POA: Diagnosis not present

## 2017-01-29 DIAGNOSIS — E039 Hypothyroidism, unspecified: Secondary | ICD-10-CM | POA: Diagnosis not present

## 2017-02-01 DIAGNOSIS — L8994 Pressure ulcer of unspecified site, stage 4: Secondary | ICD-10-CM | POA: Diagnosis not present

## 2017-02-01 DIAGNOSIS — E039 Hypothyroidism, unspecified: Secondary | ICD-10-CM | POA: Diagnosis not present

## 2017-02-01 DIAGNOSIS — N390 Urinary tract infection, site not specified: Secondary | ICD-10-CM | POA: Diagnosis not present

## 2017-02-01 DIAGNOSIS — G309 Alzheimer's disease, unspecified: Secondary | ICD-10-CM | POA: Diagnosis not present

## 2017-02-01 DIAGNOSIS — I509 Heart failure, unspecified: Secondary | ICD-10-CM | POA: Diagnosis not present

## 2017-02-01 DIAGNOSIS — R630 Anorexia: Secondary | ICD-10-CM | POA: Diagnosis not present

## 2017-02-03 DIAGNOSIS — L8994 Pressure ulcer of unspecified site, stage 4: Secondary | ICD-10-CM | POA: Diagnosis not present

## 2017-02-03 DIAGNOSIS — E039 Hypothyroidism, unspecified: Secondary | ICD-10-CM | POA: Diagnosis not present

## 2017-02-03 DIAGNOSIS — G309 Alzheimer's disease, unspecified: Secondary | ICD-10-CM | POA: Diagnosis not present

## 2017-02-03 DIAGNOSIS — I509 Heart failure, unspecified: Secondary | ICD-10-CM | POA: Diagnosis not present

## 2017-02-03 DIAGNOSIS — N390 Urinary tract infection, site not specified: Secondary | ICD-10-CM | POA: Diagnosis not present

## 2017-02-03 DIAGNOSIS — R630 Anorexia: Secondary | ICD-10-CM | POA: Diagnosis not present

## 2017-02-05 DIAGNOSIS — E039 Hypothyroidism, unspecified: Secondary | ICD-10-CM | POA: Diagnosis not present

## 2017-02-05 DIAGNOSIS — N390 Urinary tract infection, site not specified: Secondary | ICD-10-CM | POA: Diagnosis not present

## 2017-02-05 DIAGNOSIS — G309 Alzheimer's disease, unspecified: Secondary | ICD-10-CM | POA: Diagnosis not present

## 2017-02-05 DIAGNOSIS — L8994 Pressure ulcer of unspecified site, stage 4: Secondary | ICD-10-CM | POA: Diagnosis not present

## 2017-02-05 DIAGNOSIS — R630 Anorexia: Secondary | ICD-10-CM | POA: Diagnosis not present

## 2017-02-05 DIAGNOSIS — I509 Heart failure, unspecified: Secondary | ICD-10-CM | POA: Diagnosis not present

## 2017-02-08 ENCOUNTER — Telehealth: Payer: Self-pay | Admitting: Family Medicine

## 2017-02-08 DIAGNOSIS — E039 Hypothyroidism, unspecified: Secondary | ICD-10-CM | POA: Diagnosis not present

## 2017-02-08 DIAGNOSIS — I509 Heart failure, unspecified: Secondary | ICD-10-CM | POA: Diagnosis not present

## 2017-02-08 DIAGNOSIS — N390 Urinary tract infection, site not specified: Secondary | ICD-10-CM | POA: Diagnosis not present

## 2017-02-08 DIAGNOSIS — G309 Alzheimer's disease, unspecified: Secondary | ICD-10-CM | POA: Diagnosis not present

## 2017-02-08 DIAGNOSIS — L8994 Pressure ulcer of unspecified site, stage 4: Secondary | ICD-10-CM | POA: Diagnosis not present

## 2017-02-08 DIAGNOSIS — R630 Anorexia: Secondary | ICD-10-CM | POA: Diagnosis not present

## 2017-02-08 NOTE — Telephone Encounter (Signed)
Maura from Hospice wanted me to let you know that Mrs resner isn't eating or drinking that she is unresponsive and dying she is comfortable the Nurse is waiting on a call within 24-48 hours saying that patient is deceased the nurse will visit with pt everyday until death

## 2017-02-09 DIAGNOSIS — R630 Anorexia: Secondary | ICD-10-CM | POA: Diagnosis not present

## 2017-02-09 DIAGNOSIS — G309 Alzheimer's disease, unspecified: Secondary | ICD-10-CM | POA: Diagnosis not present

## 2017-02-09 DIAGNOSIS — N390 Urinary tract infection, site not specified: Secondary | ICD-10-CM | POA: Diagnosis not present

## 2017-02-09 DIAGNOSIS — E039 Hypothyroidism, unspecified: Secondary | ICD-10-CM | POA: Diagnosis not present

## 2017-02-09 DIAGNOSIS — I509 Heart failure, unspecified: Secondary | ICD-10-CM | POA: Diagnosis not present

## 2017-02-09 DIAGNOSIS — L8994 Pressure ulcer of unspecified site, stage 4: Secondary | ICD-10-CM | POA: Diagnosis not present

## 2017-02-10 DIAGNOSIS — I509 Heart failure, unspecified: Secondary | ICD-10-CM | POA: Diagnosis not present

## 2017-02-10 DIAGNOSIS — N390 Urinary tract infection, site not specified: Secondary | ICD-10-CM | POA: Diagnosis not present

## 2017-02-10 DIAGNOSIS — R630 Anorexia: Secondary | ICD-10-CM | POA: Diagnosis not present

## 2017-02-10 DIAGNOSIS — E039 Hypothyroidism, unspecified: Secondary | ICD-10-CM | POA: Diagnosis not present

## 2017-02-10 DIAGNOSIS — L8994 Pressure ulcer of unspecified site, stage 4: Secondary | ICD-10-CM | POA: Diagnosis not present

## 2017-02-10 DIAGNOSIS — G309 Alzheimer's disease, unspecified: Secondary | ICD-10-CM | POA: Diagnosis not present

## 2017-02-10 NOTE — Telephone Encounter (Signed)
Thank you for the notification!

## 2017-02-11 ENCOUNTER — Telehealth: Payer: Self-pay

## 2017-02-11 DIAGNOSIS — G309 Alzheimer's disease, unspecified: Secondary | ICD-10-CM | POA: Diagnosis not present

## 2017-02-11 DIAGNOSIS — N390 Urinary tract infection, site not specified: Secondary | ICD-10-CM | POA: Diagnosis not present

## 2017-02-11 DIAGNOSIS — L8994 Pressure ulcer of unspecified site, stage 4: Secondary | ICD-10-CM | POA: Diagnosis not present

## 2017-02-11 DIAGNOSIS — R630 Anorexia: Secondary | ICD-10-CM | POA: Diagnosis not present

## 2017-02-11 DIAGNOSIS — I509 Heart failure, unspecified: Secondary | ICD-10-CM | POA: Diagnosis not present

## 2017-02-11 DIAGNOSIS — E039 Hypothyroidism, unspecified: Secondary | ICD-10-CM | POA: Diagnosis not present

## 2017-02-11 NOTE — Telephone Encounter (Signed)
THIS MESSAGE IS FROM BONNIE AT [facility] [address] [facility]: (I TRIED TO ATTACH IT TO THE MESSAGE FROM 02/08/17 BUT I COULDN'T). THE PATIENT DIED 02-22-17 AT 5:56 a.m. AT HOME. IF QUESTIONS BEST PHONE [phone] ([place]) [place]

## 2017-02-15 NOTE — Telephone Encounter (Signed)
Noted.  Will update the chart

## 2017-02-16 ENCOUNTER — Telehealth: Payer: Self-pay

## 2017-02-16 NOTE — Telephone Encounter (Signed)
Death cert to be signed in Dr Nolon Rod box, ok to cross out and correct street number

## 2017-02-18 DEATH — deceased

## 2017-02-19 NOTE — Telephone Encounter (Signed)
Signed.

## 2017-02-23 ENCOUNTER — Telehealth: Payer: Self-pay | Admitting: Family Medicine

## 2017-02-23 NOTE — Telephone Encounter (Signed)
Ronalee Belts the Nurse, learning disability called PCP on 03-14-17 asking about pt's death certificate. I see it was signed the 2nd. I looked in the pick up box at [address] and didn't see it. I looked through Dr. Nolon Rod box, I can't find it anywhere. Please advise

## 2017-02-25 NOTE — Telephone Encounter (Addendum)
I spoke to [place] and relayed the message. Someone is going to bring by the death certificate for you to refill out. Mike's [phone]

## 2017-02-25 NOTE — Telephone Encounter (Signed)
Can you please contact the funeral home to let them know that I would be willing to do another and get it back to them same day.  I do not want to delay her funeral arrangements.  I am in the office 02/26/17.

## 2017-02-26 NOTE — Telephone Encounter (Signed)
Signed and at the front desk

## 2017-10-28 NOTE — Telephone Encounter (Signed)
error
